# Patient Record
Sex: Male | Born: 1945 | Race: White | Hispanic: No | Marital: Married | Smoking: Former smoker
Health system: Southern US, Community
[De-identification: ages and names within clinical notes are randomized; demographics above are authoritative.]

## PROBLEM LIST (undated history)

## (undated) DIAGNOSIS — Z8669 Personal history of other diseases of the nervous system and sense organs: Secondary | ICD-10-CM

## (undated) DIAGNOSIS — S2239XA Fracture of one rib, unspecified side, initial encounter for closed fracture: Secondary | ICD-10-CM

## (undated) DIAGNOSIS — G43909 Migraine, unspecified, not intractable, without status migrainosus: Secondary | ICD-10-CM

## (undated) DIAGNOSIS — K219 Gastro-esophageal reflux disease without esophagitis: Secondary | ICD-10-CM

## (undated) DIAGNOSIS — M1990 Unspecified osteoarthritis, unspecified site: Secondary | ICD-10-CM

## (undated) DIAGNOSIS — D126 Benign neoplasm of colon, unspecified: Secondary | ICD-10-CM

## (undated) DIAGNOSIS — Z86010 Personal history of colonic polyps: Secondary | ICD-10-CM

## (undated) DIAGNOSIS — R519 Headache, unspecified: Secondary | ICD-10-CM

## (undated) DIAGNOSIS — M47812 Spondylosis without myelopathy or radiculopathy, cervical region: Secondary | ICD-10-CM

## (undated) DIAGNOSIS — M2550 Pain in unspecified joint: Secondary | ICD-10-CM

## (undated) DIAGNOSIS — J449 Chronic obstructive pulmonary disease, unspecified: Secondary | ICD-10-CM

## (undated) DIAGNOSIS — R51 Headache: Secondary | ICD-10-CM

## (undated) DIAGNOSIS — H269 Unspecified cataract: Secondary | ICD-10-CM

## (undated) DIAGNOSIS — J309 Allergic rhinitis, unspecified: Secondary | ICD-10-CM

## (undated) DIAGNOSIS — K620 Anal polyp: Secondary | ICD-10-CM

## (undated) DIAGNOSIS — I10 Essential (primary) hypertension: Secondary | ICD-10-CM

## (undated) DIAGNOSIS — N4889 Other specified disorders of penis: Secondary | ICD-10-CM

## (undated) DIAGNOSIS — D649 Anemia, unspecified: Secondary | ICD-10-CM

## (undated) DIAGNOSIS — Z98811 Dental restoration status: Secondary | ICD-10-CM

## (undated) DIAGNOSIS — Z973 Presence of spectacles and contact lenses: Secondary | ICD-10-CM

## (undated) DIAGNOSIS — G5622 Lesion of ulnar nerve, left upper limb: Secondary | ICD-10-CM

## (undated) DIAGNOSIS — E782 Mixed hyperlipidemia: Secondary | ICD-10-CM

## (undated) DIAGNOSIS — M1000 Idiopathic gout, unspecified site: Secondary | ICD-10-CM

## (undated) DIAGNOSIS — Z974 Presence of external hearing-aid: Secondary | ICD-10-CM

## (undated) DIAGNOSIS — M069 Rheumatoid arthritis, unspecified: Secondary | ICD-10-CM

## (undated) DIAGNOSIS — K922 Gastrointestinal hemorrhage, unspecified: Secondary | ICD-10-CM

## (undated) DIAGNOSIS — N400 Enlarged prostate without lower urinary tract symptoms: Secondary | ICD-10-CM

## (undated) DIAGNOSIS — E785 Hyperlipidemia, unspecified: Secondary | ICD-10-CM

## (undated) DIAGNOSIS — H43811 Vitreous degeneration, right eye: Secondary | ICD-10-CM

## (undated) DIAGNOSIS — K3580 Unspecified acute appendicitis: Secondary | ICD-10-CM

## (undated) DIAGNOSIS — Z860101 Personal history of adenomatous and serrated colon polyps: Secondary | ICD-10-CM

## (undated) DIAGNOSIS — M549 Dorsalgia, unspecified: Secondary | ICD-10-CM

## (undated) DIAGNOSIS — N401 Enlarged prostate with lower urinary tract symptoms: Secondary | ICD-10-CM

## (undated) DIAGNOSIS — F329 Major depressive disorder, single episode, unspecified: Secondary | ICD-10-CM

## (undated) DIAGNOSIS — N529 Male erectile dysfunction, unspecified: Secondary | ICD-10-CM

## (undated) DIAGNOSIS — H9190 Unspecified hearing loss, unspecified ear: Secondary | ICD-10-CM

## (undated) DIAGNOSIS — F32A Depression, unspecified: Secondary | ICD-10-CM

## (undated) HISTORY — DX: Unspecified osteoarthritis, unspecified site: M19.90

## (undated) HISTORY — DX: Vitreous degeneration, right eye: H43.811

## (undated) HISTORY — DX: Anal polyp: K62.0

## (undated) HISTORY — DX: Benign prostatic hyperplasia without lower urinary tract symptoms: N40.0

## (undated) HISTORY — DX: Chronic obstructive pulmonary disease, unspecified: J44.9

## (undated) HISTORY — DX: Unspecified hearing loss, unspecified ear: H91.90

## (undated) HISTORY — DX: Essential (primary) hypertension: I10

## (undated) HISTORY — DX: Male erectile dysfunction, unspecified: N52.9

## (undated) HISTORY — DX: Major depressive disorder, single episode, unspecified: F32.9

## (undated) HISTORY — DX: Allergic rhinitis, unspecified: J30.9

## (undated) HISTORY — DX: Benign neoplasm of colon, unspecified: D12.6

## (undated) HISTORY — DX: Pain in unspecified joint: M25.50

## (undated) HISTORY — PX: COLONOSCOPY: SHX174

## (undated) HISTORY — DX: Depression, unspecified: F32.A

## (undated) HISTORY — DX: Personal history of other diseases of the nervous system and sense organs: Z86.69

## (undated) HISTORY — DX: Gastro-esophageal reflux disease without esophagitis: K21.9

## (undated) HISTORY — DX: Lesion of ulnar nerve, left upper limb: G56.22

## (undated) HISTORY — PX: TONSILLECTOMY: SUR1361

## (undated) HISTORY — DX: Unspecified acute appendicitis: K35.80

## (undated) HISTORY — DX: Gastrointestinal hemorrhage, unspecified: K92.2

## (undated) HISTORY — PX: CATARACT EXTRACTION W/ INTRAOCULAR LENS  IMPLANT, BILATERAL: SHX1307

## (undated) HISTORY — DX: Fracture of one rib, unspecified side, initial encounter for closed fracture: S22.39XA

## (undated) HISTORY — DX: Hyperlipidemia, unspecified: E78.5

## (undated) HISTORY — DX: Rheumatoid arthritis, unspecified: M06.9

## (undated) HISTORY — DX: Dorsalgia, unspecified: M54.9

## (undated) HISTORY — DX: Other specified disorders of penis: N48.89

## (undated) HISTORY — DX: Unspecified cataract: H26.9

## (undated) HISTORY — DX: Spondylosis without myelopathy or radiculopathy, cervical region: M47.812

---

## 2002-03-24 ENCOUNTER — Ambulatory Visit (HOSPITAL_COMMUNITY): Admission: RE | Admit: 2002-03-24 | Discharge: 2002-03-24 | Payer: Self-pay | Admitting: Surgery

## 2002-03-24 ENCOUNTER — Encounter: Payer: Self-pay | Admitting: Surgery

## 2003-04-11 DIAGNOSIS — N486 Induration penis plastica: Secondary | ICD-10-CM

## 2003-04-11 DIAGNOSIS — N529 Male erectile dysfunction, unspecified: Secondary | ICD-10-CM

## 2003-04-11 HISTORY — DX: Male erectile dysfunction, unspecified: N52.9

## 2003-04-11 HISTORY — DX: Induration penis plastica: N48.6

## 2004-04-10 DIAGNOSIS — F32A Depression, unspecified: Secondary | ICD-10-CM

## 2004-04-10 HISTORY — DX: Depression, unspecified: F32.A

## 2007-04-11 DIAGNOSIS — Z8669 Personal history of other diseases of the nervous system and sense organs: Secondary | ICD-10-CM

## 2007-04-11 DIAGNOSIS — H43811 Vitreous degeneration, right eye: Secondary | ICD-10-CM

## 2007-04-11 HISTORY — DX: Vitreous degeneration, right eye: H43.811

## 2007-04-11 HISTORY — DX: Personal history of other diseases of the nervous system and sense organs: Z86.69

## 2007-05-10 ENCOUNTER — Ambulatory Visit: Payer: Self-pay | Admitting: Gastroenterology

## 2007-05-17 ENCOUNTER — Ambulatory Visit: Payer: Self-pay | Admitting: Gastroenterology

## 2007-05-17 ENCOUNTER — Encounter: Payer: Self-pay | Admitting: Gastroenterology

## 2011-04-11 DIAGNOSIS — K620 Anal polyp: Secondary | ICD-10-CM

## 2011-04-11 DIAGNOSIS — J449 Chronic obstructive pulmonary disease, unspecified: Secondary | ICD-10-CM

## 2011-04-11 HISTORY — DX: Anal polyp: K62.0

## 2011-04-11 HISTORY — DX: Chronic obstructive pulmonary disease, unspecified: J44.9

## 2011-07-06 ENCOUNTER — Ambulatory Visit: Payer: Self-pay | Admitting: Family Medicine

## 2011-07-06 ENCOUNTER — Other Ambulatory Visit: Payer: Self-pay | Admitting: Family Medicine

## 2011-07-06 ENCOUNTER — Ambulatory Visit
Admission: RE | Admit: 2011-07-06 | Discharge: 2011-07-06 | Disposition: A | Discharge status: Home / Self Care | Payer: Medicare Other | Source: Ambulatory Visit | Attending: Family Medicine | Admitting: Family Medicine

## 2011-07-06 DIAGNOSIS — R0989 Other specified symptoms and signs involving the circulatory and respiratory systems: Secondary | ICD-10-CM

## 2011-12-13 ENCOUNTER — Other Ambulatory Visit (HOSPITAL_COMMUNITY): Payer: Self-pay | Admitting: Family Medicine

## 2011-12-13 ENCOUNTER — Ambulatory Visit (HOSPITAL_COMMUNITY): Payer: Medicare Other | Attending: Cardiovascular Disease | Admitting: Radiology

## 2011-12-13 DIAGNOSIS — R9431 Abnormal electrocardiogram [ECG] [EKG]: Secondary | ICD-10-CM

## 2011-12-13 DIAGNOSIS — Z8249 Family history of ischemic heart disease and other diseases of the circulatory system: Secondary | ICD-10-CM | POA: Insufficient documentation

## 2011-12-13 DIAGNOSIS — Z87891 Personal history of nicotine dependence: Secondary | ICD-10-CM | POA: Insufficient documentation

## 2011-12-13 DIAGNOSIS — J449 Chronic obstructive pulmonary disease, unspecified: Secondary | ICD-10-CM | POA: Insufficient documentation

## 2011-12-13 DIAGNOSIS — J4489 Other specified chronic obstructive pulmonary disease: Secondary | ICD-10-CM | POA: Insufficient documentation

## 2011-12-13 DIAGNOSIS — I379 Nonrheumatic pulmonary valve disorder, unspecified: Secondary | ICD-10-CM | POA: Insufficient documentation

## 2011-12-13 DIAGNOSIS — I10 Essential (primary) hypertension: Secondary | ICD-10-CM | POA: Insufficient documentation

## 2011-12-13 NOTE — Progress Notes (Signed)
Echocardiogram performed.  

## 2011-12-21 ENCOUNTER — Other Ambulatory Visit: Payer: Self-pay | Admitting: Family Medicine

## 2011-12-21 DIAGNOSIS — F17200 Nicotine dependence, unspecified, uncomplicated: Secondary | ICD-10-CM

## 2012-01-02 ENCOUNTER — Ambulatory Visit (INDEPENDENT_AMBULATORY_CARE_PROVIDER_SITE_OTHER): Payer: Medicare Other | Admitting: Surgery

## 2012-01-02 ENCOUNTER — Encounter: Payer: Self-pay | Admitting: Gastroenterology

## 2012-01-02 ENCOUNTER — Encounter (INDEPENDENT_AMBULATORY_CARE_PROVIDER_SITE_OTHER): Payer: Self-pay | Admitting: Surgery

## 2012-01-02 VITALS — BP 128/86 | HR 70 | Temp 97.3°F | Resp 16 | Ht 73.0 in | Wt 271.4 lb

## 2012-01-02 DIAGNOSIS — K620 Anal polyp: Secondary | ICD-10-CM

## 2012-01-02 DIAGNOSIS — K621 Rectal polyp: Secondary | ICD-10-CM

## 2012-01-02 DIAGNOSIS — K648 Other hemorrhoids: Secondary | ICD-10-CM

## 2012-01-02 DIAGNOSIS — Z86010 Personal history of colonic polyps: Secondary | ICD-10-CM

## 2012-01-02 NOTE — Progress Notes (Signed)
Subjective:     Patient ID: Colton Cummings, male   DOB: March 20, 1946, 66 y.o.   MRN: [id number]  HPI  Colton Cummings  27-Sep-1945 [id number]  Patient Care Team: Carolyne Fiscal, MD as PCP - General (Family Medicine) Louis Meckel, MD as Consulting Physician (Gastroenterology)  This patient is a 66 y.o.male who presents today for surgical evaluation at the request of Dr. Duaine Dredge.   Reason for evaluation: Polyp and anal canal.  Pleasant obese male with a history of colon polyps.  Followed by colonoscopies with Dr. Arlyce Dice.  It lasted 2009.  Was found to have a lump in the anal canal by his primary care physician.  Sent to me for evaluation.  Patient has a bowel movement a day.  Some mild discomfort and bleeding at times but not every day.  No history of anal warts or anal receptive sex.  No history of sores or lesions.  No personal nor family history of GI/colon cancer, inflammatory bowel disease, irritable bowel syndrome, allergy such as Celiac Sprue, dietary/dairy problems, colitis, ulcers nor gastritis.  No recent sick contacts/gastroenteritis.  No travel outside the country.  No changes in diet.  Patient walks 30 minutes for about 1/2 mile without difficulty.  No exertional chest/neck/shoulder/arm pain.  The patient also notes a small skin mass on his right inner arm.  Occasionally itches.  He does not think it is gotten larger.  It is a concern to him however.   Patient Active Problem List  Diagnosis  . Personal history of colonic polyps  . Polyp of anal canal, anterior midline  . Hemorrhoids, internal    Past Medical History  Diagnosis Date  . Arthritis   . HTN (hypertension)   . GERD (gastroesophageal reflux disease)   . Anal polyp 2013    No past surgical history on file.  History   Social History  . Marital Status: Married    Spouse Name: N/A    Number of Children: N/A  . Years of Education: N/A   Occupational History  . Not on file.   Social History Main  Topics  . Smoking status: Former Smoker    Types: Cigarettes    Quit date: 01/02/2000  . Smokeless tobacco: Not on file  . Alcohol Use: 0.5 - 1.0 oz/week    1-2 drink(s) per week  . Drug Use: No  . Sexually Active: Not on file   Other Topics Concern  . Not on file   Social History Narrative  . No narrative on file    No family history on file.  Current Outpatient Prescriptions  Medication Sig Dispense Refill  . aspirin 81 MG tablet Take 81 mg by mouth daily.      . chlorthalidone (HYGROTON) 25 MG tablet       . finasteride (PROSCAR) 5 MG tablet       . IBUPROFEN PO Take by mouth.      Marland Kitchen lisinopril (PRINIVIL,ZESTRIL) 10 MG tablet       . omeprazole (PRILOSEC) 20 MG capsule       . simvastatin (ZOCOR) 10 MG tablet       . Tamsulosin HCl (FLOMAX) 0.4 MG CAPS       . tolterodine (DETROL) 2 MG tablet          Not on File  BP 128/86  Pulse 70  Temp 97.3 F (36.3 C) (Temporal)  Resp 16  Ht 6\' 1"  (1.854 m)  Wt 271 lb 6.4 oz (  123.106 kg)  BMI 35.81 kg/m2  No results found.   Review of Systems  Constitutional: Negative for fever, chills and diaphoresis.  HENT: Negative for nosebleeds, sore throat, facial swelling, mouth sores, trouble swallowing and ear discharge.   Eyes: Negative for photophobia, discharge and visual disturbance.  Respiratory: Negative for choking, chest tightness, shortness of breath and stridor.   Cardiovascular: Negative for chest pain and palpitations.  Gastrointestinal: Negative for nausea, vomiting, abdominal pain, diarrhea, constipation, abdominal distention and anal bleeding.  Genitourinary: Negative for dysuria, urgency, difficulty urinating and testicular pain.  Musculoskeletal: Negative for myalgias, back pain, arthralgias and gait problem.  Skin: Negative for color change, pallor, rash and wound.  Neurological: Negative for dizziness, speech difficulty, weakness, numbness and headaches.  Hematological: Negative for adenopathy. Does not  bruise/bleed easily.  Psychiatric/Behavioral: Negative for hallucinations, confusion and agitation.       Objective:   Physical Exam  Constitutional: He is oriented to person, place, and time. He appears well-developed and well-nourished. No distress.  HENT:  Head: Normocephalic.  Mouth/Throat: Oropharynx is clear and moist. No oropharyngeal exudate.  Eyes: Conjunctivae normal and EOM are normal. Pupils are equal, round, and reactive to light. No scleral icterus.  Neck: Normal range of motion. Neck supple. No tracheal deviation present.  Cardiovascular: Normal rate, regular rhythm and intact distal pulses.   Pulmonary/Chest: Effort normal and breath sounds normal. No respiratory distress.  Abdominal: Soft. He exhibits no distension. There is no tenderness. Hernia confirmed negative in the right inguinal area and confirmed negative in the left inguinal area.  Genitourinary: Penis normal. No penile tenderness.       Exam done with assistance of male Medical Assistant in the room. Perianal skin clean with good hygiene.  No pruritis.  No external skin tags / hemorrhoids of significance.  No pilonidal disease.  No fissure.  No abscess/fistula.  Tolerates digital and anoscopic rectal exam.  Normal sphincter tone.    Ant midline 10x38mm pedunculated polyp in anal canal.  Hard but mobile.  Hemorrhoidal piles mildly enlarged R post>L lat>R ant  Musculoskeletal: Normal range of motion. He exhibits no tenderness.  Lymphadenopathy:    He has no cervical adenopathy.       Right: No inguinal adenopathy present.       Left: No inguinal adenopathy present.  Neurological: He is alert and oriented to person, place, and time. No cranial nerve deficit. He exhibits normal muscle tone. Coordination normal.  Skin: Skin is warm and dry. No rash noted. He is not diaphoretic. No erythema. No pallor.     Psychiatric: He has a normal mood and affect. His behavior is normal. Judgment and thought content normal.        Assessment:     Anal canal polyp, probable benign but hard  Benign appearing skin lesion    Plan:     Myalgias this is a benign lesion.  However, the only way to be certain as to remove it.  One option is just to observe and if it does not change in size or character to just follow.  Other option is removal.  Given its location, I do not think I can do this office & would do in the operating room.  He is leaning more towards removal.  The anatomy & physiology of the anorectal region was discussed.  The pathophysiology of anorectal masses and differential diagnosis was discussed.  Natural history risks without surgery was discussed such as further growth and cancer.  The patient's symptoms are not adequately controlled by non-operative treatments.  I feel the risks & problems of no surgery outweigh the operative risks; therefore, I recommended surgery to treat the anal canal mass by removal .  Risks such as bleeding, infection, need for further treatment, heart attack, death, and other risks were discussed.   I noted a good likelihood this will help address the problem. Goals of post-operative recovery were discussed as well.  Possibility that this will not correct all symptoms was explained.  Post-operative pain, bleeding, constipation, and other problems after surgery were discussed.  We will work to minimize complications.   Educational handouts further explaining the pathology, treatment options, and bowel regimen were given as well.  Questions were answered.  The patient expresses understanding & wishes to proceed with surgery.  The skin lesion on his right arm for the most part appears benign but it is not completely normal.  May be reasonable remove that as well.  Follow at least.  We will see how insurance will cover that her

## 2012-01-02 NOTE — Patient Instructions (Signed)
See the Handout(s) we gave you.  Consider surgery to remove the polyp/mass in the anal canal.  Please call our office at [phone] if you wish to schedule surgery or if you have further questions / concerns.   HEMORRHOIDS   The rectum is the last few inches of your colon, and it naturally stretches to hold stool.  Hemorrhoidal piles are natural clusters of blood vessels that help the rectum stretch to hold stool and allow bowel movements to eliminate feces.  Hemorrhoids are abnormally swollen blood vessels in the rectum.  Too much pressure in the rectum causes hemorrhoids by forcing blood to stretch and bulge the walls of the veins, sometimes even rupturing them.  Hemorrhoids can become like varicose veins you might see on a person's legs. When bulging hemorrhoidal veins are irritated, they can swell, burn, itch, become very painful, and bleed. Once the rectal veins have been stretched out and hemorrhoids created, they are difficult to get rid of completely and tend to recur with less straining than it took to cause them in the first place. Fortunately, good habits and simple medical treatment usually control hemorrhoids well, and surgery is only recommended in unusually severe cases. Some of the most frequent causes of hemorrhoids:    Constant sitting    Straining with bowel movements (from constipation or hard stools)    Diarrhea    Sitting on the toilet for a long time    Severe coughing    Childbirth    Heavy Lifting  Types of Hemorrhoids:    Internal hemorrhoids usually don't hurt or itch; they are deep inside the rectum and usually have no sensation. However, internal hemorrhoids can bleed.  Such bleeding should not be ignored and mask blood from a dangerous source like colorectal cancer, so persistent rectal bleeding should be investigated with a colonoscopy.    External hemorrhoids cause most of the symptoms - pain, burning, and itching. Unirritated hemorrhoids can look like small skin  tags coming out of the anus.     Thrombosed hemorrhoids can form when a hemorrhoid blood vessel bursts and causes the hemorrhoid to swell.  A purple blood clot can form in it and become an excruciatingly painful lump at the anus. Because of these unpleasant symptoms, immediate incision and drainage by a surgeon at an office visit can provide much relief of the pain.    PREVENTION Avoiding the causes listed in above will prevent most cases of hemorrhoids, but this advice is sometimes hard to follow:  How can you avoid sitting all day if you have a seated job? Also, we try to avoid coughing and diarrhea, but sometimes it's beyond your control.  Still, there are some practical hints to help:    If your main job activity is seated, always stand or walk during your breaks. Make it a point to stand and walk at least 5 minutes every hour and try to shift frequently in your chair to avoid direct rectal pressure.    Always exhale as you strain or lift. Don't hold your breath.    Treat coughing, diarrhea and constipation early since irritated hemorrhoids may soon follow.    Do not delay or try to prevent a bowel movement when the urge is present.   Exercise regularly (walking or jogging 60 minutes a day) to stimulate the bowels to move.   Avoid dry toilet paper when cleaning after bowel movements.  Moistened tissues such as baby wipes are less irritating.  Lightly pat  the rectal area dry.  Using irrigating showers or bottle irrigation washing can more gently clean this sensitive area.   Keep the anal and genital area clean and  dry.  Talcum or baby powders can help   GET YOUR STOOLS SOFT.   This is the most important way to prevent irritated hemorrhoids.  Hard stools are like sandpaper to the anorectal canal and will cause more problems.   The goal: ONE SOFT BOWEL MOVEMENT A DAY!  To have soft, regular bowel movements:    Drink at least 8 tall glasses of water a day.     AVOID CONSTIPATION    Take plenty of  fiber.  Fiber is the undigested part of plant food that passes into the colon, acting s "natures broom" to encourage bowel motility and movement.  Fiber can absorb and hold large amounts of water. This results in a larger, bulkier stool, which is soft and easier to pass. Work gradually over several weeks up to 6 servings a day of fiber (25g a day even more if needed) in the form of: o Vegetables -- Root (potatoes, carrots, turnips), leafy green (lettuce, salad greens, celery, spinach), or cooked high residue (cabbage, broccoli, etc) o Fruit -- Fresh (unpeeled skin & pulp), Dried (prunes, apricots, cherries, etc ),  or stewed ( applesauce)  o Whole grain breads, pasta, etc (whole wheat)  o Bran cereals    Bulking Agents -- This type of water-retaining fiber generally is easily obtained each day by one of the following:  o Psyllium bran -- The psyllium plant is remarkable because its ground seeds can retain so much water. This product is available as Metamucil, Konsyl, Effersyllium, Per Diem Fiber, or the less expensive generic preparation in drug and health food stores. Although labeled a laxative, it really is not a laxative.  o Methylcellulose -- This is another fiber derived from wood which also retains water. It is available as Citrucel. o Polyethylene Glycol - and "artificial" fiber commonly called Miralax or Glycolax.  It is helpful for people with gassy or bloated feelings with regular fiber o Flax Seed - a less gassy fiber than psyllium   No reading or other relaxing activity while on the toilet. If bowel movements take longer than 5 minutes, you are too constipated   Laxatives can be useful for a short period if constipation is severe o Osmotics (Milk of Magnesia, Fleets phosphosoda, Magnesium citrate, MiraLax, GoLytely) are safer than  o Stimulants (Senokot, Castor Oil, Dulcolax, Ex Lax)    o Do not take laxatives for more than 7days in a row.   Laxatives are not a good long-term solution as  it can stress the intestine and colon and causes too much mineral and fluid losses.    If badly constipated, try a Bowel Retraining Program: o Do not use laxatives.  o Eat a diet high in roughage, such as bran cereals and leafy vegetables.  o Drink six (6) ounces of prune or apricot juice each morning.  o Eat two (2) large servings of stewed fruit each day.  o Take one (1) heaping dose of a bulking agent (ex. Metamucil, Citrucel, Miralax) twice a day.  o Use sugar-free sweetener when possible to avoid excessive calories.  o Eat a normal breakfast.  o Set aside 15 minutes after breakfast to sit on the toilet, but do not strain to have a bowel movement.  o If you do not have a bowel movement by the third day, use  an enema and repeat the above steps.    AVOID DIARRHEA o Switch to liquids and simpler foods for a few days to avoid stressing your intestines further. o Avoid dairy products (especially milk & ice cream) for a short time.  The intestines often can lose the ability to digest lactose when stressed. o Avoid foods that cause gassiness or bloating.  Typical foods include beans and other legumes, cabbage, broccoli, and dairy foods.  Every person has some sensitivity to other foods, so listen to our body and avoid those foods that trigger problems for you. o Adding fiber (Citrucel, Metamucil, psyllium, Miralax) gradually can help thicken stools by absorbing excess fluid and retrain the intestines to act more normally.  Slowly increase the dose over a few weeks.  Too much fiber too soon can backfire and cause cramping & bloating. o Probiotics (such as active yogurt, Align, etc) may help repopulate the intestines and colon with normal bacteria and calm down a sensitive digestive tract.  Most studies show it to be of mild help, though, and such products can be costly. o Medicines:   Bismuth subsalicylate (ex. Kayopectate, Pepto Bismol) every 30 minutes for up to 6 doses can help control diarrhea.   Avoid if pregnant.   Loperamide (Immodium) can slow down diarrhea.  Start with two tablets (4mg  total) first and then try one tablet every 6 hours.  Avoid if you are having fevers or severe pain.  If you are not better or start feeling worse, stop all medicines and call your doctor for advice o Call your doctor if you are getting worse or not better.  Sometimes further testing (cultures, endoscopy, X-ray studies, bloodwork, etc) may be needed to help diagnose and treat the cause of the diarrhea.   If these preventive measures fail, you must take action right away! Hemorrhoids are one condition that can be mild in the morning and become intolerable by nightfall.

## 2012-01-03 ENCOUNTER — Ambulatory Visit
Admission: RE | Admit: 2012-01-03 | Discharge: 2012-01-03 | Disposition: A | Discharge status: Home / Self Care | Payer: No Typology Code available for payment source | Source: Ambulatory Visit | Attending: Family Medicine | Admitting: Family Medicine

## 2012-01-03 DIAGNOSIS — F17200 Nicotine dependence, unspecified, uncomplicated: Secondary | ICD-10-CM

## 2012-01-11 ENCOUNTER — Other Ambulatory Visit (INDEPENDENT_AMBULATORY_CARE_PROVIDER_SITE_OTHER): Payer: Self-pay | Admitting: Surgery

## 2012-01-11 DIAGNOSIS — K620 Anal polyp: Secondary | ICD-10-CM

## 2012-01-11 DIAGNOSIS — K621 Rectal polyp: Secondary | ICD-10-CM

## 2012-01-11 DIAGNOSIS — K648 Other hemorrhoids: Secondary | ICD-10-CM

## 2012-01-11 HISTORY — PX: HEMORRHOID SURGERY: SHX153

## 2012-01-11 HISTORY — PX: RECTAL EXAM UNDER ANESTHESIA: SHX6399

## 2012-01-11 HISTORY — PX: RECTAL POLYPECTOMY: SHX2309

## 2012-01-16 ENCOUNTER — Telehealth (INDEPENDENT_AMBULATORY_CARE_PROVIDER_SITE_OTHER): Payer: Self-pay | Admitting: General Surgery

## 2012-01-16 NOTE — Telephone Encounter (Signed)
Message copied by Liliana Cline on Tue Jan 16, 2012  2:22 PM ------      Message from: Ardeth Sportsman      Created: Fri Jan 12, 2012  7:07 PM       Tell pt the good news! ANAL POLYP BENIGN

## 2012-01-16 NOTE — Telephone Encounter (Signed)
Left message on machine for patient to call back for path results.   

## 2012-01-29 ENCOUNTER — Ambulatory Visit (INDEPENDENT_AMBULATORY_CARE_PROVIDER_SITE_OTHER): Payer: Medicare Other | Admitting: Surgery

## 2012-01-29 ENCOUNTER — Encounter (INDEPENDENT_AMBULATORY_CARE_PROVIDER_SITE_OTHER): Payer: Self-pay | Admitting: Surgery

## 2012-01-29 VITALS — BP 148/84 | HR 84 | Resp 18 | Ht 73.0 in | Wt 257.0 lb

## 2012-01-29 DIAGNOSIS — K648 Other hemorrhoids: Secondary | ICD-10-CM

## 2012-01-29 DIAGNOSIS — K620 Anal polyp: Secondary | ICD-10-CM

## 2012-01-29 DIAGNOSIS — K621 Rectal polyp: Secondary | ICD-10-CM

## 2012-01-29 NOTE — Progress Notes (Signed)
Subjective:     Patient ID: Colton Cummings, male   DOB: 09-14-1945, 66 y.o.   MRN: [id number]  HPI  TARRENCE ENCK  [birth year] [id number]  Patient Care Team: Carolyne Fiscal, MD as PCP - General (Family Medicine) Louis Meckel, MD as Consulting Physician (Gastroenterology)  This patient is a 66 y.o.male who presents today for surgical evaluation s/p EUA & excision of anal canal polyp.   FINAL DIAGNOSIS Diagnosis Anus, biopsy, canal mass anterior midline - BENIGN FIBROEPITHELIAL POLYP (HYPERTROPHIED ANAL PAPILLAE). - NO DYSPLASIA OR MALIGNANCY.  The patient comes in today feeling well.  Mild soreness resolving.  No bleeding.  Feels something funny popping out with daily bowel movements.  No fevers or chills.  Energy level good.  Appetite good.  Patient Active Problem List  Diagnosis  . Personal history of colonic polyps  . Polyp of anal canal, anterior midline  . Hemorrhoids, internal    Past Medical History  Diagnosis Date  . Arthritis   . HTN (hypertension)   . GERD (gastroesophageal reflux disease)   . Anal polyp 2013    Past Surgical History  Procedure Date  . Rectal polypectomy 01/11/2012    History   Social History  . Marital Status: Married    Spouse Name: N/A    Number of Children: N/A  . Years of Education: N/A   Occupational History  . Not on file.   Social History Main Topics  . Smoking status: Former Smoker    Types: Cigarettes    Quit date: 01/02/2000  . Smokeless tobacco: Not on file  . Alcohol Use: 0.5 - 1.0 oz/week    1-2 drink(s) per week  . Drug Use: No  . Sexually Active: Not on file   Other Topics Concern  . Not on file   Social History Narrative  . No narrative on file    No family history on file.  Current Outpatient Prescriptions  Medication Sig Dispense Refill  . aspirin 81 MG tablet Take 81 mg by mouth daily.      . chlorthalidone (HYGROTON) 25 MG tablet       . finasteride (PROSCAR) 5 MG tablet       . IBUPROFEN  PO Take by mouth.      Marland Kitchen lisinopril (PRINIVIL,ZESTRIL) 10 MG tablet       . omeprazole (PRILOSEC) 20 MG capsule       . simvastatin (ZOCOR) 10 MG tablet       . Tamsulosin HCl (FLOMAX) 0.4 MG CAPS       . tolterodine (DETROL) 2 MG tablet          No Known Allergies  BP 148/84  Pulse 84  Resp 18  Ht 6\' 1"  (1.854 m)  Wt 257 lb (116.574 kg)  BMI 33.91 kg/m2  Ct Chest Wo/cm Screening  01/03/2012  *RADIOLOGY REPORT*  Clinical Data: Ex-smoker.  Lung cancer screening.  CT CHEST SCREENING WITHOUT CONTRAST  Technique:  Multidetector CT imaging of the chest was performed following the standard low-dose protocol without IV contrast.  Comparison: None.  Findings: No pathologically enlarged mediastinal or axillary lymph nodes.  Hilar regions are difficult to definitively evaluate without IV contrast.  Atherosclerotic calcification of the arterial vasculature, including coronary arteries.  Ascending aorta measures 4.0 cm.  Pulmonary arteries are enlarged.  There is thickening of the distal esophageal wall which can be seen with gastroesophageal reflux disease.  7 x 8 mm nodule, right middle lobe, image 203.  4 x 5 mm triangular-shaped nodule, along the right major fissure measures, image 188, likely a subpleural lymph node.  6 x 4 mm nodule versus vascular bifurcation, left lower lobe, image 183.  Additional scattered pulmonary nodules measure less than 4 mm in size.  No pleural fluid.  Airway is unremarkable.  Incidental imaging of the upper abdomen shows no acute findings. No worrisome lytic or sclerotic lesions. Degenerative Schmorl's nodes are seen in the mid and lower thoracic spine.  IMPRESSION:  1.  Scattered pulmonary nodules measure up to 7 x 8 mm in the right upper lobe.  Follow-up low-dose CT chest without contrast is recommended in 3 months. This recommendation follows the 2012 National [employer] lung cancer screening guidelines.  These results will be called to the ordering  clinician or representative by the Radiologist Assistant, and communication documented in the PACS Dashboard. 2.  Ascending aortic aneurysm, pulmonary arterial hypertension and coronary artery calcification.   Original Report Authenticated By: Reyes Ivan, M.D.      Review of Systems  Constitutional: Negative for fever, chills and diaphoresis.  HENT: Negative for sore throat, trouble swallowing and neck pain.   Eyes: Negative for photophobia and visual disturbance.  Respiratory: Negative for choking and shortness of breath.   Cardiovascular: Negative for chest pain and palpitations.  Gastrointestinal: Negative for nausea, vomiting, abdominal pain, diarrhea, constipation, blood in stool, abdominal distention and anal bleeding.       Mild rectal discomfort  Genitourinary: Negative for dysuria, urgency, difficulty urinating and testicular pain.  Musculoskeletal: Negative for myalgias, arthralgias and gait problem.  Skin: Negative for color change and rash.  Neurological: Negative for dizziness, speech difficulty, weakness and numbness.  Hematological: Negative for adenopathy.  Psychiatric/Behavioral: Negative for hallucinations, confusion and agitation.       Objective:   Physical Exam  Constitutional: He is oriented to person, place, and time. He appears well-developed and well-nourished. No distress.  HENT:  Head: Normocephalic.  Mouth/Throat: Oropharynx is clear and moist. No oropharyngeal exudate.  Eyes: Conjunctivae normal and EOM are normal. Pupils are equal, round, and reactive to light. No scleral icterus.  Neck: Normal range of motion. No tracheal deviation present.  Cardiovascular: Normal rate, normal heart sounds and intact distal pulses.   Pulmonary/Chest: Effort normal. No respiratory distress.  Abdominal: Soft. He exhibits no distension. There is no tenderness. Hernia confirmed negative in the right inguinal area and confirmed negative in the left inguinal area.        Incisions clean with normal healing ridges.  No hernias  Genitourinary: Penis normal.       Exam done with assistance of male Medical Assistant in the room.  Perianal skin clean with good hygiene.  No pruritis.  No external skin tags / hemorrhoids of significance.  No pilonidal disease.  No fissure.  No abscess/fistula.  Normal sphincter tone.    Anal canal narrow surgical wound with exposed tail of Vicryl stitch.  Trimmed off  Musculoskeletal: Normal range of motion. He exhibits no tenderness.  Neurological: He is alert and oriented to person, place, and time. No cranial nerve deficit. He exhibits normal muscle tone. Coordination normal.  Skin: Skin is warm and dry. No rash noted. He is not diaphoretic.  Psychiatric: He has a normal mood and affect. His behavior is normal.       Assessment:     Status post excision of prolapsing anal canal polyp.  Recovering well.    Plan:  Increase activity as tolerated.  Do not push through pain.  Diet as tolerated. Bowel regimen to avoid problems.  Return to clinic p.r.n. The patient expressed understanding and appreciation

## 2012-01-29 NOTE — Patient Instructions (Signed)
GETTING TO GOOD BOWEL HEALTH. Irregular bowel habits such as constipation and diarrhea can lead to many problems over time.  Having one soft bowel movement a day is the most important way to prevent further problems.  The anorectal canal is designed to handle stretching and feces to safely manage our ability to get rid of solid waste (feces, poop, stool) out of our body.  BUT, hard constipated stools can act like ripping concrete bricks and diarrhea can be a burning fire to this very sensitive area of our body, causing inflamed hemorrhoids, anal fissures, increasing risk is perirectal abscesses, abdominal pain/bloating, an making irritable bowel worse.     The goal: ONE SOFT BOWEL MOVEMENT A DAY!  To have soft, regular bowel movements:    Drink at least 8 tall glasses of water a day.     Take plenty of fiber.  Fiber is the undigested part of plant food that passes into the colon, acting s "natures broom" to encourage bowel motility and movement.  Fiber can absorb and hold large amounts of water. This results in a larger, bulkier stool, which is soft and easier to pass. Work gradually over several weeks up to 6 servings a day of fiber (25g a day even more if needed) in the form of: o Vegetables -- Root (potatoes, carrots, turnips), leafy green (lettuce, salad greens, celery, spinach), or cooked high residue (cabbage, broccoli, etc) o Fruit -- Fresh (unpeeled skin & pulp), Dried (prunes, apricots, cherries, etc ),  or stewed ( applesauce)  o Whole grain breads, pasta, etc (whole wheat)  o Bran cereals    Bulking Agents -- This type of water-retaining fiber generally is easily obtained each day by one of the following:  o Psyllium bran -- The psyllium plant is remarkable because its ground seeds can retain so much water. This product is available as Metamucil, Konsyl, Effersyllium, Per Diem Fiber, or the less expensive generic preparation in drug and health food stores. Although labeled a laxative, it really  is not a laxative.  o Methylcellulose -- This is another fiber derived from wood which also retains water. It is available as Citrucel. o Polyethylene Glycol - and "artificial" fiber commonly called Miralax or Glycolax.  It is helpful for people with gassy or bloated feelings with regular fiber o Flax Seed - a less gassy fiber than psyllium   No reading or other relaxing activity while on the toilet. If bowel movements take longer than 5 minutes, you are too constipated   AVOID CONSTIPATION.  High fiber and water intake usually takes care of this.  Sometimes a laxative is needed to stimulate more frequent bowel movements, but    Laxatives are not a good long-term solution as it can wear the colon out. o Osmotics (Milk of Magnesia, Fleets phosphosoda, Magnesium citrate, MiraLax, GoLytely) are safer than  o Stimulants (Senokot, Castor Oil, Dulcolax, Ex Lax)    o Do not take laxatives for more than 7days in a row.    IF SEVERELY CONSTIPATED, try a Bowel Retraining Program: o Do not use laxatives.  o Eat a diet high in roughage, such as bran cereals and leafy vegetables.  o Drink six (6) ounces of prune or apricot juice each morning.  o Eat two (2) large servings of stewed fruit each day.  o Take one (1) heaping tablespoon of a psyllium-based bulking agent twice a day. Use sugar-free sweetener when possible to avoid excessive calories.  o Eat a normal breakfast.  o   Set aside 15 minutes after breakfast to sit on the toilet, but do not strain to have a bowel movement.  o If you do not have a bowel movement by the third day, use an enema and repeat the above steps.    Controlling diarrhea o Switch to liquids and simpler foods for a few days to avoid stressing your intestines further. o Avoid dairy products (especially milk & ice cream) for a short time.  The intestines often can lose the ability to digest lactose when stressed. o Avoid foods that cause gassiness or bloating.  Typical foods include  beans and other legumes, cabbage, broccoli, and dairy foods.  Every person has some sensitivity to other foods, so listen to our body and avoid those foods that trigger problems for you. o Adding fiber (Citrucel, Metamucil, psyllium, Miralax) gradually can help thicken stools by absorbing excess fluid and retrain the intestines to act more normally.  Slowly increase the dose over a few weeks.  Too much fiber too soon can backfire and cause cramping & bloating. o Probiotics (such as active yogurt, Align, etc) may help repopulate the intestines and colon with normal bacteria and calm down a sensitive digestive tract.  Most studies show it to be of mild help, though, and such products can be costly. o Medicines:   Bismuth subsalicylate (ex. Kayopectate, Pepto Bismol) every 30 minutes for up to 6 doses can help control diarrhea.  Avoid if pregnant.   Loperamide (Immodium) can slow down diarrhea.  Start with two tablets (4mg  total) first and then try one tablet every 6 hours.  Avoid if you are having fevers or severe pain.  If you are not better or start feeling worse, stop all medicines and call your doctor for advice o Call your doctor if you are getting worse or not better.  Sometimes further testing (cultures, endoscopy, X-ray studies, bloodwork, etc) may be needed to help diagnose and treat the cause of the diarrhea.  HEMORRHOIDS   The rectum is the last few inches of your colon, and it naturally stretches to hold stool.  Hemorrhoidal piles are natural clusters of blood vessels that help the rectum stretch to hold stool and allow bowel movements to eliminate feces.  Hemorrhoids are abnormally swollen blood vessels in the rectum.  Too much pressure in the rectum causes hemorrhoids by forcing blood to stretch and bulge the walls of the veins, sometimes even rupturing them.  Hemorrhoids can become like varicose veins you might see on a person's legs. When bulging hemorrhoidal veins are irritated, they can  swell, burn, itch, become very painful, and bleed. Once the rectal veins have been stretched out and hemorrhoids created, they are difficult to get rid of completely and tend to recur with less straining than it took to cause them in the first place. Fortunately, good habits and simple medical treatment usually control hemorrhoids well, and surgery is only recommended in unusually severe cases. Some of the most frequent causes of hemorrhoids:    Constant sitting    Straining with bowel movements (from constipation or hard stools)    Diarrhea    Sitting on the toilet for a long time    Severe coughing    Childbirth    Heavy Lifting  Types of Hemorrhoids:    Internal hemorrhoids usually don't hurt or itch; they are deep inside the rectum and usually have no sensation. However, internal hemorrhoids can bleed.  Such bleeding should not be ignored and mask blood from a  dangerous source like colorectal cancer, so persistent rectal bleeding should be investigated with a colonoscopy.    External hemorrhoids cause most of the symptoms - pain, burning, and itching. Unirritated hemorrhoids can look like small skin tags coming out of the anus.     Thrombosed hemorrhoids can form when a hemorrhoid blood vessel bursts and causes the hemorrhoid to swell.  A purple blood clot can form in it and become an excruciatingly painful lump at the anus. Because of these unpleasant symptoms, immediate incision and drainage by a surgeon at an office visit can provide much relief of the pain.    PREVENTION Avoiding the causes listed in above will prevent most cases of hemorrhoids, but this advice is sometimes hard to follow:  How can you avoid sitting all day if you have a seated job? Also, we try to avoid coughing and diarrhea, but sometimes it's beyond your control.  Still, there are some practical hints to help:    If your main job activity is seated, always stand or walk during your breaks. Make it a point to stand and  walk at least 5 minutes every hour and try to shift frequently in your chair to avoid direct rectal pressure.    Always exhale as you strain or lift. Don't hold your breath.    Treat coughing, diarrhea and constipation early since irritated hemorrhoids may soon follow.    Do not delay or try to prevent a bowel movement when the urge is present.   Exercise regularly (walking or jogging 60 minutes a day) to stimulate the bowels to move.   Avoid dry toilet paper when cleaning after bowel movements.  Moistened tissues such as baby wipes are less irritating.  Lightly pat the rectal area dry.  Using irrigating showers or bottle irrigation washing can more gently clean this sensitive area.   Keep the anal and genital area clean and  dry.  Talcum or baby powders can help   GET YOUR STOOLS SOFT.   This is the most important way to prevent irritated hemorrhoids.  Hard stools are like sandpaper to the anorectal canal and will cause more problems.   The goal: ONE SOFT BOWEL MOVEMENT A DAY!  To have soft, regular bowel movements:    Drink at least 8 tall glasses of water a day.     AVOID CONSTIPATION    Take plenty of fiber.  Fiber is the undigested part of plant food that passes into the colon, acting s "natures broom" to encourage bowel motility and movement.  Fiber can absorb and hold large amounts of water. This results in a larger, bulkier stool, which is soft and easier to pass. Work gradually over several weeks up to 6 servings a day of fiber (25g a day even more if needed) in the form of: o Vegetables -- Root (potatoes, carrots, turnips), leafy green (lettuce, salad greens, celery, spinach), or cooked high residue (cabbage, broccoli, etc) o Fruit -- Fresh (unpeeled skin & pulp), Dried (prunes, apricots, cherries, etc ),  or stewed ( applesauce)  o Whole grain breads, pasta, etc (whole wheat)  o Bran cereals    Bulking Agents -- This type of water-retaining fiber generally is easily obtained each day by  one of the following:  o Psyllium bran -- The psyllium plant is remarkable because its ground seeds can retain so much water. This product is available as Metamucil, Konsyl, Effersyllium, Per Diem Fiber, or the less expensive generic preparation in drug and health  food stores. Although labeled a laxative, it really is not a laxative.  o Methylcellulose -- This is another fiber derived from wood which also retains water. It is available as Citrucel. o Polyethylene Glycol - and "artificial" fiber commonly called Miralax or Glycolax.  It is helpful for people with gassy or bloated feelings with regular fiber o Flax Seed - a less gassy fiber than psyllium   No reading or other relaxing activity while on the toilet. If bowel movements take longer than 5 minutes, you are too constipated   Laxatives can be useful for a short period if constipation is severe o Osmotics (Milk of Magnesia, Fleets phosphosoda, Magnesium citrate, MiraLax, GoLytely) are safer than  o Stimulants (Senokot, Castor Oil, Dulcolax, Ex Lax)    o Do not take laxatives for more than 7days in a row.   Laxatives are not a good long-term solution as it can stress the intestine and colon and causes too much mineral and fluid losses.    If badly constipated, try a Bowel Retraining Program: o Do not use laxatives.  o Eat a diet high in roughage, such as bran cereals and leafy vegetables.  o Drink six (6) ounces of prune or apricot juice each morning.  o Eat two (2) large servings of stewed fruit each day.  o Take one (1) heaping dose of a bulking agent (ex. Metamucil, Citrucel, Miralax) twice a day.  o Use sugar-free sweetener when possible to avoid excessive calories.  o Eat a normal breakfast.  o Set aside 15 minutes after breakfast to sit on the toilet, but do not strain to have a bowel movement.  o If you do not have a bowel movement by the third day, use an enema and repeat the above steps.    AVOID DIARRHEA o Switch to liquids and  simpler foods for a few days to avoid stressing your intestines further. o Avoid dairy products (especially milk & ice cream) for a short time.  The intestines often can lose the ability to digest lactose when stressed. o Avoid foods that cause gassiness or bloating.  Typical foods include beans and other legumes, cabbage, broccoli, and dairy foods.  Every person has some sensitivity to other foods, so listen to our body and avoid those foods that trigger problems for you. o Adding fiber (Citrucel, Metamucil, psyllium, Miralax) gradually can help thicken stools by absorbing excess fluid and retrain the intestines to act more normally.  Slowly increase the dose over a few weeks.  Too much fiber too soon can backfire and cause cramping & bloating. o Probiotics (such as active yogurt, Align, etc) may help repopulate the intestines and colon with normal bacteria and calm down a sensitive digestive tract.  Most studies show it to be of mild help, though, and such products can be costly. o Medicines:   Bismuth subsalicylate (ex. Kayopectate, Pepto Bismol) every 30 minutes for up to 6 doses can help control diarrhea.  Avoid if pregnant.   Loperamide (Immodium) can slow down diarrhea.  Start with two tablets (4mg  total) first and then try one tablet every 6 hours.  Avoid if you are having fevers or severe pain.  If you are not better or start feeling worse, stop all medicines and call your doctor for advice o Call your doctor if you are getting worse or not better.  Sometimes further testing (cultures, endoscopy, X-ray studies, bloodwork, etc) may be needed to help diagnose and treat the cause of the diarrhea.  If these preventive measures fail, you must take action right away! Hemorrhoids are one condition that can be mild in the morning and become intolerable by nightfall.

## 2012-02-27 ENCOUNTER — Other Ambulatory Visit: Payer: Self-pay | Admitting: Family Medicine

## 2012-02-27 DIAGNOSIS — R911 Solitary pulmonary nodule: Secondary | ICD-10-CM

## 2012-03-11 ENCOUNTER — Ambulatory Visit
Admission: RE | Admit: 2012-03-11 | Discharge: 2012-03-11 | Disposition: A | Discharge status: Home / Self Care | Payer: Medicare Other | Source: Ambulatory Visit | Attending: Family Medicine | Admitting: Family Medicine

## 2012-03-11 DIAGNOSIS — R911 Solitary pulmonary nodule: Secondary | ICD-10-CM

## 2012-04-29 ENCOUNTER — Encounter: Payer: Self-pay | Admitting: Gastroenterology

## 2012-05-09 ENCOUNTER — Encounter: Payer: Self-pay | Admitting: Gastroenterology

## 2012-05-27 ENCOUNTER — Encounter (INDEPENDENT_AMBULATORY_CARE_PROVIDER_SITE_OTHER): Payer: Self-pay

## 2012-06-18 ENCOUNTER — Ambulatory Visit (AMBULATORY_SURGERY_CENTER): Payer: Medicare Other | Admitting: *Deleted

## 2012-06-18 ENCOUNTER — Encounter: Payer: Self-pay | Admitting: Gastroenterology

## 2012-06-18 VITALS — Ht 73.0 in | Wt 265.4 lb

## 2012-06-18 DIAGNOSIS — Z1211 Encounter for screening for malignant neoplasm of colon: Secondary | ICD-10-CM

## 2012-06-18 DIAGNOSIS — Z86010 Personal history of colonic polyps: Secondary | ICD-10-CM

## 2012-06-18 MED ORDER — NA SULFATE-K SULFATE-MG SULF 17.5-3.13-1.6 GM/177ML PO SOLN: ORAL | Status: DC

## 2012-06-28 ENCOUNTER — Telehealth: Payer: Self-pay

## 2012-06-28 NOTE — Telephone Encounter (Signed)
Message copied by Chrystie Nose on Fri Jun 28, 2012  1:39 PM ------      Message from: Melvia Heaps D      Created: Fri Jun 28, 2012  1:28 PM       Needs OV for dysphagia. ------

## 2012-06-28 NOTE — Telephone Encounter (Signed)
Pt scheduled to see Dr. Arlyce Dice 07/10/12@9 :30am. Letter mailed to pt.

## 2012-07-01 ENCOUNTER — Ambulatory Visit (AMBULATORY_SURGERY_CENTER): Payer: Medicare Other | Admitting: Gastroenterology

## 2012-07-01 ENCOUNTER — Encounter: Payer: Self-pay | Admitting: Gastroenterology

## 2012-07-01 VITALS — BP 119/77 | HR 65 | Temp 97.6°F | Resp 18 | Ht 73.0 in | Wt 265.0 lb

## 2012-07-01 DIAGNOSIS — D126 Benign neoplasm of colon, unspecified: Secondary | ICD-10-CM

## 2012-07-01 DIAGNOSIS — K648 Other hemorrhoids: Secondary | ICD-10-CM

## 2012-07-01 DIAGNOSIS — Z86010 Personal history of colonic polyps: Secondary | ICD-10-CM

## 2012-07-01 DIAGNOSIS — Z1211 Encounter for screening for malignant neoplasm of colon: Secondary | ICD-10-CM

## 2012-07-01 MED ORDER — SODIUM CHLORIDE 0.9 % IV SOLN: 500.0000 mL | INTRAVENOUS | Status: DC

## 2012-07-01 NOTE — Op Note (Signed)
[facility] [address] [place], [address]   COLONOSCOPY PROCEDURE REPORT  PATIENT: Colton, Cummings  MR#: [id number] BIRTHDATE: [birth year] , 66  yrs. old GENDER: Male ENDOSCOPIST: Louis Meckel, MD REFERRED XB:JYNWG Duaine Dredge, M.D. PROCEDURE DATE:  07/01/2012 PROCEDURE:   Colonoscopy with snare polypectomy and Submucosal injection, any substance ASA CLASS:   Class II INDICATIONS:Patient's personal history of adenomatous colon polyps. colon polyps 2003, 2009 MEDICATIONS: MAC sedation, administered by CRNA and propofol (Diprivan) 300mg  IV  DESCRIPTION OF PROCEDURE:   After the risks benefits and alternatives of the procedure were thoroughly explained, informed consent was obtained.       The LB [id number]  endoscope was introduced through the anus and advanced to the cecum, which was identified by both the appendix and ileocecal valve. No adverse events experienced.   The quality of the prep was Suprep excellent The instrument was then slowly withdrawn as the colon was fully examined.      COLON FINDINGS: A sessile polyp was found at the cecum.  A polypectomy was performed with a cold snare.  The resection was complete and the polyp tissue was completely retrieved.   A sessile polyp measuring 15 mm in size was found in the proximal ascending colon.  A polypectomy was performed.  The polyp was removed piecemeal followingsubmucosal injection of 2 cc normal saline.  A single endoscopic clip was placed across the polypectomy site.  The resection was complete and the polyp tissue was completely retrieved.  A saline Injection was given to lift the mucosal wall. Internal hemorrhoids were found.   The colon mucosa was otherwise normal.  Retroflexed views revealed no abnormalities. The time to cecum=3 minutes 39 seconds.  Withdrawal time=18 minutes 04 seconds. The scope was withdrawn and the procedure completed. COMPLICATIONS: There were no  complications.  ENDOSCOPIC IMPRESSION: 1.   Sessile polyp was found at the cecum; polypectomy was performed with a cold snare 2.   Sessile polyp measuring 15 mm in size was found in the proximal ascending colon; polypectomy was performed; saline was given to lift the mucosal wall 3.   Internal hemorrhoids 4.   The colon mucosa was otherwise normal  RECOMMENDATIONS: If the polyp(s) removed today are proven to be adenomatous (pre-cancerous) polyps, you will need a colonoscopy in  year. Otherwise you should continue to follow colorectal cancer screening guidelines for "routine risk" patients with a colonoscopy in 10 years.  You will receive a letter within 1-2 weeks with the results of your biopsy as well as final recommendations.  Please call my office if you have not received a letter after 3 weeks.   eSigned:  Louis Meckel, MD 07/01/2012 9:44 AM   cc:   PATIENT NAME:  Colton Cummings, Colton Cummings MR#: [id number]

## 2012-07-01 NOTE — Progress Notes (Signed)
Called to room to assist during endoscopic procedure.  Patient ID and intended procedure confirmed with present staff. Received instructions for my participation in the procedure from the performing physician.  

## 2012-07-01 NOTE — Progress Notes (Signed)
Per Dr Arlyce Dice, pt doesn't need to stop his ASA

## 2012-07-01 NOTE — Progress Notes (Signed)
Patient did not experience any of the following events: a burn prior to discharge; a fall within the facility; wrong site/side/patient/procedure/implant event; or a hospital transfer or hospital admission upon discharge from the facility. (G8907) Patient did not have preoperative order for IV antibiotic SSI prophylaxis. (G8918)  

## 2012-07-01 NOTE — Patient Instructions (Addendum)

## 2012-07-02 ENCOUNTER — Telehealth: Payer: Self-pay

## 2012-07-02 NOTE — Telephone Encounter (Signed)
  Follow up Call-  Call back number 07/01/2012  Post procedure Call Back phone  # [phone]  Permission to leave phone message Yes     Patient questions:  Do you have a fever, pain , or abdominal swelling? no Pain Score  0 *  Have you tolerated food without any problems? yes  Have you been able to return to your normal activities? yes  Do you have any questions about your discharge instructions: Diet   no Medications  no Follow up visit  no  Do you have questions or concerns about your Care? no  Actions: * If pain score is 4 or above: No action needed, pain <4.  No problems per the pt. Maw

## 2012-07-05 ENCOUNTER — Encounter: Payer: Self-pay | Admitting: Gastroenterology

## 2012-07-08 ENCOUNTER — Telehealth: Payer: Self-pay | Admitting: *Deleted

## 2012-07-08 NOTE — Telephone Encounter (Signed)
Patient's wife called, and patient had a colonoscopy last Friday with Dr. Arlyce Dice.   Patient had had a little bleeding Friday, but nothing bad til they went to [place].  At that time the patient began to bleed lot of blood while taking a bathroom stop on their way home.  He passed out in the [facility]. The ambulance took him to the nearby hospital where they did admit him.  Today the doctor will be doing another colonoscopy to see what happened.   He does have an appointment with Dr. Arlyce Dice tomorrow.   I suggested that the Dr. In [place] call Dr. Arlyce Dice to speak with him with an update.  The patient's wife called either directly to the recovery room or was transferred from the 3rd floor.  There was no original phone note.

## 2012-07-08 NOTE — Telephone Encounter (Signed)
If you can obtain a number work to call the physician in [place] I would be happy to do so

## 2012-07-10 ENCOUNTER — Encounter: Payer: Self-pay | Admitting: Gastroenterology

## 2012-07-10 ENCOUNTER — Ambulatory Visit (INDEPENDENT_AMBULATORY_CARE_PROVIDER_SITE_OTHER): Payer: Medicare Other | Admitting: Gastroenterology

## 2012-07-10 VITALS — BP 128/72 | HR 66 | Ht 73.0 in | Wt 257.0 lb

## 2012-07-10 DIAGNOSIS — Z86010 Personal history of colonic polyps: Secondary | ICD-10-CM

## 2012-07-10 DIAGNOSIS — K921 Melena: Secondary | ICD-10-CM

## 2012-07-10 DIAGNOSIS — K922 Gastrointestinal hemorrhage, unspecified: Secondary | ICD-10-CM | POA: Insufficient documentation

## 2012-07-10 MED ORDER — FERROUS SULFATE DRIED 200 (65 FE) MG PO TABS: ORAL_TABLET | ORAL | Status: DC

## 2012-07-10 NOTE — Assessment & Plan Note (Signed)
Followup colonoscopy 2017 in view of size of the polyp

## 2012-07-10 NOTE — Assessment & Plan Note (Addendum)
The patient had a post polypectomy bleed despite placing a prophylactic endoscopic clip across the polypectomy site.  Bleeding has subsided spontaneously.  Recommendations #1 iron supplementation for a couple of weeks

## 2012-07-10 NOTE — Patient Instructions (Addendum)
We will send in your prescription to your pharmacy We will obtain your records from [facility]

## 2012-07-10 NOTE — Progress Notes (Signed)
History of Present Illness:  Mr. Wichman has returned following colonoscopy where a 15-20 mm serrated adenoma was removed from his right colon. Approximately 6 days post procedure, while traveling, he developed hematochezia and was hospitalized in [place]. Repeat colonoscopy, by report, demonstrated a few red spots in the area polypectomy but no active bleeding. He's been home for 2 days and has had no further bleeding. He has no other GI complaints.    Review of Systems: Pertinent positive and negative review of systems were noted in the above HPI section. All other review of systems were otherwise negative.    Current Medications, Allergies, Past Medical History, Past Surgical History, Family History and Social History were reviewed in [facility] electronic medical record  Vital signs were reviewed in today's medical record. Physical Exam: General: Well developed , well nourished, no acute distress

## 2012-07-19 ENCOUNTER — Telehealth: Payer: Self-pay | Admitting: Gastroenterology

## 2012-07-19 NOTE — Telephone Encounter (Signed)
Rec'd from [facility] forward 8 pages to Dr.Kaplan 07/19/12

## 2012-12-06 ENCOUNTER — Other Ambulatory Visit: Payer: Self-pay | Admitting: Family Medicine

## 2012-12-06 DIAGNOSIS — R911 Solitary pulmonary nodule: Secondary | ICD-10-CM

## 2012-12-11 ENCOUNTER — Ambulatory Visit
Admission: RE | Admit: 2012-12-11 | Discharge: 2012-12-11 | Disposition: A | Discharge status: Home / Self Care | Payer: Medicare Other | Source: Ambulatory Visit | Attending: Family Medicine | Admitting: Family Medicine

## 2012-12-11 DIAGNOSIS — R911 Solitary pulmonary nodule: Secondary | ICD-10-CM

## 2013-04-10 DIAGNOSIS — G5622 Lesion of ulnar nerve, left upper limb: Secondary | ICD-10-CM

## 2013-04-10 HISTORY — DX: Lesion of ulnar nerve, left upper limb: G56.22

## 2014-01-10 ENCOUNTER — Encounter: Payer: Self-pay | Admitting: Gastroenterology

## 2014-02-09 ENCOUNTER — Other Ambulatory Visit: Payer: Self-pay | Admitting: Family Medicine

## 2014-02-09 DIAGNOSIS — Z87898 Personal history of other specified conditions: Secondary | ICD-10-CM

## 2014-02-10 ENCOUNTER — Ambulatory Visit
Admission: RE | Admit: 2014-02-10 | Discharge: 2014-02-10 | Disposition: A | Discharge status: Home / Self Care | Payer: Medicare Other | Source: Ambulatory Visit | Attending: Family Medicine | Admitting: Family Medicine

## 2014-02-10 DIAGNOSIS — Z87898 Personal history of other specified conditions: Secondary | ICD-10-CM

## 2014-02-25 ENCOUNTER — Ambulatory Visit (INDEPENDENT_AMBULATORY_CARE_PROVIDER_SITE_OTHER): Payer: Self-pay

## 2014-02-25 ENCOUNTER — Ambulatory Visit (INDEPENDENT_AMBULATORY_CARE_PROVIDER_SITE_OTHER): Payer: Medicare Other | Admitting: Neurology

## 2014-02-25 DIAGNOSIS — M79609 Pain in unspecified limb: Secondary | ICD-10-CM

## 2014-02-25 DIAGNOSIS — R202 Paresthesia of skin: Principal | ICD-10-CM

## 2014-02-25 DIAGNOSIS — Z0289 Encounter for other administrative examinations: Secondary | ICD-10-CM

## 2014-02-25 NOTE — Progress Notes (Signed)
  [facility]    Provider:  Dr Jaynee Eagles Referring Provider: Marylene Land, MD Primary Care Physician:  Marylene Land, MD   HPI:  Colton Cummings is a 68 y.o. male here as a referral from Dr. Sandi Mariscal for left arm pain. Patient feels like his arm is going to sleep. There is numbness in digits 4-5 of the left hand that radiates up to the elbow. No neck pain. No radicular symptoms. Pain has been occurring near the elbow for years in the left arm but a few months ago noticed pain in the left hand fingers. +Tinel's Sign at the left elbow.  Summary: Nerve Conduction studies were performed on the bilateral upper extremities.   Bilateral Median APB motor conductions were within normal limits with normal F wave latencies. Right ADM motor conduction was within normal limits with normal F wave latency Left Ulnar ADM motor nerve showed conduction block when comparing wrist and across-elbow segment. The left Ulnar F wave latency was delayed (36.23ms, N<34) Bilateral Median sensory conductions were within normal limits.  The right Ulnar sensory conduction was within normal limits The left Ulnar sensory conduction showed decreased amplitude (7mV, N>10) Bilateral Dorsal Ulnar Cutaneous nerves were within normal limits.   Needle evaluation of the left First Dorsal Interosseous muscle showed moderately increased spontaneous activity. The following left muscles were within normal limits: Deltoid, Triceps. 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 No suggestion of cervical radiculopathy.    Sarina Ill, MD  [facility] [address] [place], [place] [address]  Phone [phone] Fax [phone]

## 2014-03-23 ENCOUNTER — Other Ambulatory Visit: Payer: Self-pay | Admitting: Orthopedic Surgery

## 2014-03-27 ENCOUNTER — Encounter (HOSPITAL_BASED_OUTPATIENT_CLINIC_OR_DEPARTMENT_OTHER): Payer: Self-pay | Admitting: *Deleted

## 2014-03-27 NOTE — Progress Notes (Signed)
Called for recent ekg-labs has a cold-taking meds

## 2014-03-27 NOTE — Progress Notes (Signed)
   03/27/14 1229  OBSTRUCTIVE SLEEP APNEA  Have you ever been diagnosed with sleep apnea through a sleep study? No  Do you snore loudly (loud enough to be heard through closed doors)?  0  Do you often feel tired, fatigued, or sleepy during the daytime? 0  Has anyone observed you stop breathing during your sleep? 0  Do you have, or are you being treated for high blood pressure? 1  BMI more than 35 kg/m2? 0  Age over 68 years old? 1  Neck circumference greater than 40 cm/16 inches? 1  Gender: 1  Obstructive Sleep Apnea Score 4  Score 4 or greater  Results sent to PCP

## 2014-03-31 ENCOUNTER — Encounter (HOSPITAL_BASED_OUTPATIENT_CLINIC_OR_DEPARTMENT_OTHER): Payer: Self-pay | Admitting: Orthopedic Surgery

## 2014-03-31 ENCOUNTER — Ambulatory Visit (HOSPITAL_BASED_OUTPATIENT_CLINIC_OR_DEPARTMENT_OTHER): Payer: Medicare Other | Admitting: Anesthesiology

## 2014-03-31 ENCOUNTER — Ambulatory Visit (HOSPITAL_BASED_OUTPATIENT_CLINIC_OR_DEPARTMENT_OTHER)
Admission: RE | Admit: 2014-03-31 | Discharge: 2014-03-31 | Disposition: A | Discharge status: Home / Self Care | Payer: Medicare Other | Source: Ambulatory Visit | Attending: Orthopedic Surgery | Admitting: Orthopedic Surgery

## 2014-03-31 ENCOUNTER — Encounter (HOSPITAL_BASED_OUTPATIENT_CLINIC_OR_DEPARTMENT_OTHER)
Admission: RE | Disposition: A | Discharge status: Home / Self Care | Payer: Self-pay | Source: Ambulatory Visit | Attending: Orthopedic Surgery

## 2014-03-31 DIAGNOSIS — M1990 Unspecified osteoarthritis, unspecified site: Secondary | ICD-10-CM | POA: Diagnosis not present

## 2014-03-31 DIAGNOSIS — K219 Gastro-esophageal reflux disease without esophagitis: Secondary | ICD-10-CM | POA: Insufficient documentation

## 2014-03-31 DIAGNOSIS — Z7982 Long term (current) use of aspirin: Secondary | ICD-10-CM | POA: Diagnosis not present

## 2014-03-31 DIAGNOSIS — G5622 Lesion of ulnar nerve, left upper limb: Secondary | ICD-10-CM | POA: Insufficient documentation

## 2014-03-31 DIAGNOSIS — I10 Essential (primary) hypertension: Secondary | ICD-10-CM | POA: Insufficient documentation

## 2014-03-31 DIAGNOSIS — Z87891 Personal history of nicotine dependence: Secondary | ICD-10-CM | POA: Insufficient documentation

## 2014-03-31 DIAGNOSIS — E785 Hyperlipidemia, unspecified: Secondary | ICD-10-CM | POA: Insufficient documentation

## 2014-03-31 HISTORY — DX: Dental restoration status: Z98.811

## 2014-03-31 HISTORY — DX: Presence of spectacles and contact lenses: Z97.3

## 2014-03-31 HISTORY — PX: ULNAR NERVE TRANSPOSITION: SHX2595

## 2014-03-31 LAB — POCT I-STAT, CHEM 8
BUN: 23 mg/dL (ref 6–23)
CREATININE: 0.8 mg/dL (ref 0.50–1.35)
Calcium, Ion: 1.18 mmol/L (ref 1.13–1.30)
Chloride: 103 mEq/L (ref 96–112)
Glucose, Bld: 107 mg/dL — ABNORMAL HIGH (ref 70–99)
HCT: 42 % (ref 39.0–52.0)
HEMOGLOBIN: 14.3 g/dL (ref 13.0–17.0)
Potassium: 3.9 mmol/L (ref 3.5–5.1)
SODIUM: 140 mmol/L (ref 135–145)
TCO2: 26 mmol/L (ref 0–100)

## 2014-03-31 SURGERY — ULNAR NERVE DECOMPRESSION/TRANSPOSITION
Anesthesia: General | Site: Elbow | Laterality: Left

## 2014-03-31 MED ORDER — ONDANSETRON HCL 4 MG/2ML IJ SOLN
INTRAMUSCULAR | Status: DC | PRN
  Administered 2014-03-31: 4 mg via INTRAVENOUS

## 2014-03-31 MED ORDER — CEFAZOLIN SODIUM-DEXTROSE 2-3 GM-% IV SOLR
INTRAVENOUS | Status: AC
  Filled 2014-03-31: qty 50

## 2014-03-31 MED ORDER — CHLORHEXIDINE GLUCONATE 4 % EX LIQD: 60.0000 mL | Freq: Once | CUTANEOUS | Status: DC

## 2014-03-31 MED ORDER — LACTATED RINGERS IV SOLN
INTRAVENOUS | Status: DC
  Administered 2014-03-31: 10:00:00 via INTRAVENOUS

## 2014-03-31 MED ORDER — FENTANYL CITRATE 0.05 MG/ML IJ SOLN
50.0000 ug | INTRAMUSCULAR | Status: DC | PRN
  Administered 2014-03-31: 100 ug via INTRAVENOUS

## 2014-03-31 MED ORDER — BUPIVACAINE-EPINEPHRINE (PF) 0.5% -1:200000 IJ SOLN
INTRAMUSCULAR | Status: DC | PRN
  Administered 2014-03-31: 25 mL via PERINEURAL

## 2014-03-31 MED ORDER — PROPOFOL 10 MG/ML IV EMUL
INTRAVENOUS | Status: AC
  Filled 2014-03-31: qty 50

## 2014-03-31 MED ORDER — FENTANYL CITRATE 0.05 MG/ML IJ SOLN
INTRAMUSCULAR | Status: AC
  Filled 2014-03-31: qty 2

## 2014-03-31 MED ORDER — HYDROCODONE-ACETAMINOPHEN 5-325 MG PO TABS: 1.0000 | ORAL_TABLET | Freq: Four times a day (QID) | ORAL | Status: DC | PRN

## 2014-03-31 MED ORDER — MIDAZOLAM HCL 2 MG/2ML IJ SOLN
1.0000 mg | INTRAMUSCULAR | Status: DC | PRN
  Administered 2014-03-31: 2 mg via INTRAVENOUS

## 2014-03-31 MED ORDER — LIDOCAINE HCL (CARDIAC) 20 MG/ML IV SOLN
INTRAVENOUS | Status: DC | PRN
  Administered 2014-03-31: 80 mg via INTRAVENOUS

## 2014-03-31 MED ORDER — MIDAZOLAM HCL 2 MG/2ML IJ SOLN
INTRAMUSCULAR | Status: AC
  Filled 2014-03-31: qty 2

## 2014-03-31 MED ORDER — DEXAMETHASONE SODIUM PHOSPHATE 4 MG/ML IJ SOLN
INTRAMUSCULAR | Status: DC | PRN
  Administered 2014-03-31: 10 mg via INTRAVENOUS

## 2014-03-31 MED ORDER — ONDANSETRON HCL 4 MG/2ML IJ SOLN: 4.0000 mg | Freq: Once | INTRAMUSCULAR | Status: DC | PRN

## 2014-03-31 MED ORDER — CEFAZOLIN SODIUM-DEXTROSE 2-3 GM-% IV SOLR: 2.0000 g | INTRAVENOUS | Status: DC

## 2014-03-31 MED ORDER — PROPOFOL 10 MG/ML IV BOLUS
INTRAVENOUS | Status: DC | PRN
  Administered 2014-03-31: 250 mg via INTRAVENOUS

## 2014-03-31 MED ORDER — FENTANYL CITRATE 0.05 MG/ML IJ SOLN
INTRAMUSCULAR | Status: AC
  Filled 2014-03-31: qty 4

## 2014-03-31 MED ORDER — HYDROMORPHONE HCL 1 MG/ML IJ SOLN: 0.2500 mg | INTRAMUSCULAR | Status: DC | PRN

## 2014-03-31 MED ORDER — CEFAZOLIN SODIUM-DEXTROSE 2-3 GM-% IV SOLR
2.0000 g | INTRAVENOUS | Status: AC
  Administered 2014-03-31: 2 g via INTRAVENOUS

## 2014-03-31 SURGICAL SUPPLY — 48 items
BLADE MINI RND TIP GREEN BEAV (BLADE) IMPLANT
BLADE SURG 15 STRL LF DISP TIS (BLADE) ×2 IMPLANT
BLADE SURG 15 STRL SS (BLADE) ×4
BNDG COHESIVE 3X5 TAN STRL LF (GAUZE/BANDAGES/DRESSINGS) ×6 IMPLANT
BNDG ESMARK 4X9 LF (GAUZE/BANDAGES/DRESSINGS) ×6 IMPLANT
BNDG GAUZE ELAST 4 BULKY (GAUZE/BANDAGES/DRESSINGS) ×6 IMPLANT
CHLORAPREP W/TINT 26ML (MISCELLANEOUS) ×6 IMPLANT
CLOSURE WOUND 1/2 X4 (GAUZE/BANDAGES/DRESSINGS) ×1
CORDS BIPOLAR (ELECTRODE) ×6 IMPLANT
COVER BACK TABLE 60X90IN (DRAPES) ×6 IMPLANT
COVER MAYO STAND STRL (DRAPES) ×6 IMPLANT
CUFF TOURNIQUET SINGLE 18IN (TOURNIQUET CUFF) ×6 IMPLANT
DECANTER SPIKE VIAL GLASS SM (MISCELLANEOUS) IMPLANT
DRAPE EXTREMITY T 121X128X90 (DRAPE) ×6 IMPLANT
DRAPE SURG 17X23 STRL (DRAPES) ×6 IMPLANT
DRSG PAD ABDOMINAL 8X10 ST (GAUZE/BANDAGES/DRESSINGS) ×6 IMPLANT
GAUZE SPONGE 4X4 12PLY STRL (GAUZE/BANDAGES/DRESSINGS) ×6 IMPLANT
GAUZE SPONGE 4X4 16PLY XRAY LF (GAUZE/BANDAGES/DRESSINGS) IMPLANT
GAUZE XEROFORM 1X8 LF (GAUZE/BANDAGES/DRESSINGS) ×6 IMPLANT
GLOVE BIOGEL PI IND STRL 8.5 (GLOVE) ×1 IMPLANT
GLOVE BIOGEL PI INDICATOR 8.5 (GLOVE) ×2
GLOVE SURG ORTHO 8.0 STRL STRW (GLOVE) ×3 IMPLANT
GOWN STRL REUS W/ TWL LRG LVL3 (GOWN DISPOSABLE) ×1 IMPLANT
GOWN STRL REUS W/TWL LRG LVL3 (GOWN DISPOSABLE) ×2
GOWN STRL REUS W/TWL XL LVL3 (GOWN DISPOSABLE) ×9 IMPLANT
LOOP VESSEL MAXI BLUE (MISCELLANEOUS) IMPLANT
NEEDLE PRECISIONGLIDE 27X1.5 (NEEDLE) IMPLANT
NS IRRIG 1000ML POUR BTL (IV SOLUTION) ×6 IMPLANT
PACK BASIN DAY SURGERY FS (CUSTOM PROCEDURE TRAY) ×6 IMPLANT
PAD CAST 3X4 CTTN HI CHSV (CAST SUPPLIES) IMPLANT
PAD CAST 4YDX4 CTTN HI CHSV (CAST SUPPLIES) ×1 IMPLANT
PADDING CAST ABS 4INX4YD NS (CAST SUPPLIES) ×2
PADDING CAST ABS COTTON 4X4 ST (CAST SUPPLIES) ×1 IMPLANT
PADDING CAST COTTON 3X4 STRL (CAST SUPPLIES)
PADDING CAST COTTON 4X4 STRL (CAST SUPPLIES) ×2
SLEEVE SCD COMPRESS KNEE MED (MISCELLANEOUS) ×3 IMPLANT
SLING ARM XL FOAM STRAP (SOFTGOODS) ×3 IMPLANT
SPLINT PLASTER CAST XFAST 3X15 (CAST SUPPLIES) IMPLANT
SPLINT PLASTER XTRA FASTSET 3X (CAST SUPPLIES)
STOCKINETTE 4X48 STRL (DRAPES) ×3 IMPLANT
STRIP CLOSURE SKIN 1/2X4 (GAUZE/BANDAGES/DRESSINGS) ×2 IMPLANT
SUT VIC AB 2-0 SH 27 (SUTURE) ×2
SUT VIC AB 2-0 SH 27XBRD (SUTURE) ×1 IMPLANT
SUT VICRYL 4-0 PS2 18IN ABS (SUTURE) ×3 IMPLANT
SYR BULB 3OZ (MISCELLANEOUS) ×6 IMPLANT
SYR CONTROL 10ML LL (SYRINGE) ×3 IMPLANT
TOWEL OR 17X24 6PK STRL BLUE (TOWEL DISPOSABLE) ×6 IMPLANT
UNDERPAD 30X30 INCONTINENT (UNDERPADS AND DIAPERS) IMPLANT

## 2014-03-31 NOTE — Discharge Instructions (Addendum)
[facility] Instructions °Hand Surgery ° °Wound Care: °Keep your hand elevated above the level of your heart.  Do not allow it to dangle by your side.  Keep the dressing dry and do not remove it unless your doctor advises you to do so.  He will usually change it at the time of your post-op visit.  Moving your fingers is advised to stimulate circulation but will depend on the site of your surgery.  If you have a splint applied, your doctor will advise you regarding movement. ° °Activity: °Do not drive or operate machinery today.  Rest today and then you may return to your normal activity and work as indicated by your physician. ° °Diet:  °Drink liquids today or eat a light diet.  You may resume a regular diet tomorrow.   ° °General expectations: °Pain for two to three days. °Fingers may become slightly swollen. ° °Call your doctor if any of the following occur: °Severe pain not relieved by pain medication. °Elevated temperature. °Dressing soaked with blood. °Inability to move fingers. °White or bluish color to fingers. ° ° °Regional Anesthesia Blocks ° °1. Numbness or the inability to move the "blocked" extremity may last from 3-48 hours after placement. The length of time depends on the medication injected and your individual response to the medication. If the numbness is not going away after 48 hours, call your surgeon. ° °2. The extremity that is blocked will need to be protected until the numbness is gone and the  Strength has returned. Because you cannot feel it, you will need to take extra care to avoid injury. Because it may be weak, you may have difficulty moving it or using it. You may not know what position it is in without looking at it while the block is in effect. ° °3. For blocks in the legs and feet, returning to weight bearing and walking needs to be done carefully. You will need to wait until the numbness is entirely gone and the strength has returned. You should be able to move your leg and foot  normally before you try and bear weight or walk. You will need someone to be with you when you first try to ensure you do not fall and possibly risk injury. ° °4. Bruising and tenderness at the needle site are common side effects and will resolve in a few days. ° °5. Persistent numbness or new problems with movement should be communicated to the surgeon or the [facility] ([phone])/ [facility] ([phone]). ° ° °Post Anesthesia Home Care Instructions ° °Activity: °Get plenty of rest for the remainder of the day. A responsible adult should stay with you for 24 hours following the procedure.  °For the next 24 hours, DO NOT: °-Drive a car °-Operate machinery °-Drink alcoholic beverages °-Take any medication unless instructed by your physician °-Make any legal decisions or sign important papers. ° °Meals: °Start with liquid foods such as gelatin or soup. Progress to regular foods as tolerated. Avoid greasy, spicy, heavy foods. If nausea and/or vomiting occur, drink only clear liquids until the nausea and/or vomiting subsides. Call your physician if vomiting continues. ° °Special Instructions/Symptoms: °Your throat may feel dry or sore from the anesthesia or the breathing tube placed in your throat during surgery. If this causes discomfort, gargle with warm salt water. The discomfort should disappear within 24 hours. ° °

## 2014-03-31 NOTE — Anesthesia Preprocedure Evaluation (Signed)
Anesthesia Evaluation  Patient identified by MRN, date of birth, ID band Patient awake    Reviewed: Allergy & Precautions, H&P , NPO status , Patient's Chart, lab work & pertinent test results  Airway Mallampati: I  TM Distance: >3 FB Neck ROM: Full    Dental  (+) Teeth Intact, Dental Advisory Given   Pulmonary former smoker,  breath sounds clear to auscultation        Cardiovascular hypertension, Pt. on medications Rhythm:Regular Rate:Normal     Neuro/Psych    GI/Hepatic GERD-  Medicated and Controlled,  Endo/Other  Morbid obesity  Renal/GU      Musculoskeletal   Abdominal   Peds  Hematology   Anesthesia Other Findings   Reproductive/Obstetrics                             Anesthesia Physical Anesthesia Plan  ASA: III  Anesthesia Plan: General   Post-op Pain Management:    Induction: Intravenous  Airway Management Planned: LMA  Additional Equipment:   Intra-op Plan:   Post-operative Plan: Extubation in OR  Informed Consent: I have reviewed the patients History and Physical, chart, labs and discussed the procedure including the risks, benefits and alternatives for the proposed anesthesia with the patient or authorized representative who has indicated his/her understanding and acceptance.   Dental advisory given  Plan Discussed with: CRNA, Anesthesiologist and Surgeon  Anesthesia Plan Comments:         Anesthesia Quick Evaluation

## 2014-03-31 NOTE — H&P (Signed)
Colton Cummings is a 68 year old right hand dominant male referred with numbness and tingling in his left hand primarily ring and small fingers on a relatively constant basis. He does have occasional pains in his thumb with numbness. This has been going on for at least 5 years. He had a shot by Dr. Mayer Camel 5 years ago to the ulnar nerve which gave him relief. He recalls no history of injury to the elbow, hand or neck. He is not awakened at night. He complains of intermittent severe to extremely severe dull aching numbness. He has been taking ibuprofen. There is no history of diabetes, thyroid problems, arthritis or gout. He has had nerve conductions by Dr. Jaynee Eagles. This reveals a cubital tunnel syndrome on his left side with needle evaluation first dorsal interosseous showing moderate increase spontaneous activity. The muscles were normal. This was done on 02-25-14.   PAST MEDICAL HISTORY:  He has no known drug allergies. He is on the following medications: Tamsulosin, finasteride, simvastatin, Chlorthalidone, Lisinopril, loratadine, aspirin, ibuprofen, Tolterodine and omeprazole. He has had no surgery.  FAMILY MEDICAL HISTORY: Positive for diabetes, high BP and arthritis.  SOCIAL HISTORY:  He does not smoke. He quit in 2001. He drinks socially.  REVIEW OF SYSTEMS: Positive for glasses, hearing loss, ringing in his ears, migraines and high BP otherwise negative 14 points.  Colton Cummings is an 68 y.o. male.   Chief Complaint: cubital tunnel left arm HPI: see above  Past Medical History  Diagnosis Date  . Arthritis   . HTN (hypertension)   . GERD (gastroesophageal reflux disease)   . Anal polyp 2013  . Hyperlipidemia   . Wears glasses   . Dental crowns present     Past Surgical History  Procedure Laterality Date  . Rectal polypectomy  01/11/2012    Excision of anal canal mass.  . Hemorrhoid surgery  01/11/2012    Internal hemorrhoid ligation  . Tonsillectomy    . Colonoscopy      Family  History  Problem Relation Age of Onset  . Colon cancer Neg Hx   . Esophageal cancer Neg Hx   . Rectal cancer Neg Hx   . Stomach cancer Neg Hx    Social History:  reports that he quit smoking about 14 years ago. His smoking use included Cigarettes. He smoked 0.00 packs per day. He has never used smokeless tobacco. He reports that he drinks about 0.5 - 1.0 oz of alcohol per week. He reports that he does not use illicit drugs.  Allergies: No Known Allergies  Medications Prior to Admission  Medication Sig Dispense Refill  . chlorpheniramine-HYDROcodone (TUSSIONEX PENNKINETIC ER) 10-8 MG/5ML LQCR Take 5 mLs by mouth every 12 (twelve) hours as needed for cough.    Marland Kitchen guaiFENesin (MUCINEX) 600 MG 12 hr tablet Take by mouth 2 (two) times daily.    Marland Kitchen loratadine (CLARITIN) 10 MG tablet Take 10 mg by mouth daily.    Marland Kitchen aspirin 81 MG tablet Take 81 mg by mouth daily.    . chlorthalidone (HYGROTON) 25 MG tablet Take 25 mg by mouth daily.     . Ferrous Sulfate Dried (FEOSOL) 200 (65 FE) MG TABS Take one tab twice a day 30 tablet 1  . finasteride (PROSCAR) 5 MG tablet Take 5 mg by mouth daily.     . IBUPROFEN PO Take by mouth as needed.     Marland Kitchen lisinopril (PRINIVIL,ZESTRIL) 10 MG tablet Take 10 mg by mouth daily.     Marland Kitchen  omeprazole (PRILOSEC) 20 MG capsule Take 20 mg by mouth daily.     . simvastatin (ZOCOR) 10 MG tablet Take 10 mg by mouth at bedtime.     . Tamsulosin HCl (FLOMAX) 0.4 MG CAPS 0.4 mg daily after supper.     . tolterodine (DETROL) 2 MG tablet Take 2 mg by mouth.       No results found for this or any previous visit (from the past 48 hour(s)).  No results found.   Pertinent items are noted in HPI.  Height 6\' 1"  (1.854 m), weight 116.574 kg (257 lb).  General appearance: alert, cooperative and appears stated age Head: Normocephalic, without obvious abnormality Neck: no JVD Resp: clear to auscultation bilaterally Cardio: regular rate and rhythm, S1, S2 normal, no murmur, click, rub  or gallop GI: soft, non-tender; bowel sounds normal; no masses,  no organomegaly Extremities: numbness left ring and small fingers Pulses: 2+ and symmetric Skin: Skin color, texture, turgor normal. No rashes or lesions Neurologic: Grossly normal Incision/Wound: na  Assessment/Plan X-rays of his neck reveals degenerative changes at multiple levels. There is some foraminal encroach at 5/6.  X-rays of his elbow reveal degenerative changes with a posterior spur in the olecranon, a spur for his lateral epicondyle.  Diagnosis: (1) Cervical spondylosis. (2) Degenerative arthritis elbow asymptomatic. (3) Cubital tunnel syndrome left arm.   We would recommend in that he shows no atrophy to the intrinsics at the present time he consider having this surgically released with the possibility of decompression or transposition dictated by findings at surgery. Pre, peri and post op care are discussed along with risks and complications. Patient is aware there is no guarantee with surgery, possibility of infection, injury to arteries, nerves, and tendons, incomplete relief and dystrophy. We have discussed with him we are attempting to halt the process and it may not show improvement if he shows muscle atrophy this generally will not return. Hopefully he can get some return but there is no guarantee he will have any significant improvement in his symptoms. He would like to proceed to have this done. This will be scheduled as an outpatient under regional anesthesia for decompression, possible transposition left ulnar nerve. Colton Cummings R 03/31/2014, 9:18 AM

## 2014-03-31 NOTE — Transfer of Care (Signed)
Immediate Anesthesia Transfer of Care Note  Patient: Colton Cummings  Procedure(s) Performed: Procedure(s): DECOMPRESSION  LEFT ULNAR NERVE (Left)  Patient Location: PACU  Anesthesia Type:GA combined with regional for post-op pain  Level of Consciousness: awake and patient cooperative  Airway & Oxygen Therapy: Patient Spontanous Breathing and Patient connected to face mask oxygen  Post-op Assessment: Report given to PACU RN and Post -op Vital signs reviewed and stable  Post vital signs: Reviewed and stable  Complications: No apparent anesthesia complications

## 2014-03-31 NOTE — Brief Op Note (Signed)
03/31/2014  11:06 AM  PATIENT:  Colton Cummings  68 y.o. male  PRE-OPERATIVE DIAGNOSIS:  cubital tunnel left  POST-OPERATIVE DIAGNOSIS:  cubital tunnel left  PROCEDURE:  Procedure(s): DECOMPRESSION  LEFT ULNAR NERVE (Left)  SURGEON:  Surgeon(s) and Role:    * Daryll Brod, MD - Primary    * Leanora Cover, MD - Assisting  PHYSICIAN ASSISTANT:   ASSISTANTS: K Merrill Deanda,MD   ANESTHESIA:   regional and general  EBL:     BLOOD ADMINISTERED:none  DRAINS: none   LOCAL MEDICATIONS USED:  NONE  SPECIMEN:  No Specimen  DISPOSITION OF SPECIMEN:  N/A  COUNTS:  YES  TOURNIQUET:   Total Tourniquet Time Documented: Upper Arm (Left) - 16 minutes Total: Upper Arm (Left) - 16 minutes   DICTATION: .Other Dictation: Dictation Number [id number]  PLAN OF CARE: Discharge to home after PACU  PATIENT DISPOSITION:  PACU - hemodynamically stable.

## 2014-03-31 NOTE — Op Note (Signed)
Dictation Number [id number]

## 2014-03-31 NOTE — Anesthesia Postprocedure Evaluation (Signed)
  Anesthesia Post-op Note  Patient: Colton Cummings  Procedure(s) Performed: Procedure(s): DECOMPRESSION  LEFT ULNAR NERVE (Left)  Patient Location: PACU  Anesthesia Type: General with regional for post op pain   Level of Consciousness: awake, alert  and oriented  Airway and Oxygen Therapy: Patient Spontanous Breathing  Post-op Pain: none  Post-op Assessment: Post-op Vital signs reviewed  Post-op Vital Signs: Reviewed  Last Vitals:  Filed Vitals:   03/31/14 1220  BP: 133/70  Pulse: 68  Temp: 36.5 C  Resp: 16    Complications: No apparent anesthesia complications

## 2014-03-31 NOTE — Op Note (Signed)
Colton Cummings, Colton Cummings                ACCOUNT NO.:  [id number]  MEDICAL RECORD NO.:  [id number]  LOCATION:                                 FACILITY:  PHYSICIAN:  Daryll Brod, M.D.            DATE OF BIRTH:  DATE OF PROCEDURE:  03/31/2014 DATE OF DISCHARGE:                              OPERATIVE REPORT   PREOPERATIVE DIAGNOSIS:  Cubital tunnel syndrome of the left thumb.  POSTOPERATIVE DIAGNOSIS:  Cubital tunnel syndrome of the left thumb.  OPERATION:  Decompression ulnar nerve, left elbow.  SURGEON:  Daryll Brod, M.D.  ASSISTANT:  Leanora Cover, MD  ANESTHESIA:  Supraclavicular block general.  ANESTHESIOLOGIST:  Lorrene Reid, M.D.  HISTORY:  The patient is a 68 year old male with a history of numbness and tingling to the ulnar nerve distribution. Nerve conduction is positive for cubital tunnel.  He has elected to undergo surgical decompression to the nerve.  He is aware that there is no guarantee with the surgery; possibility of infection; recurrence of injury to arteries, nerves, tendons, incomplete relief of symptoms, dystrophy that we are attempting to hold the process rather than guarantee, improvement, hopefully this will improve, but he is well aware that this may not show any significant change.  In the preoperative area, the patient is seen, the extremity marked by both the patient and surgeon.  Antibiotic given.  PROCEDURE IN DETAIL:  The patient was brought to the operating room after a supraclavicular block was carried out in the preoperative area by Dr. Al Corpus.  He was prepped and draped using ChloraPrep in supine position with the left arm free.  A general anesthetic had been administered under the direction of Dr. Al Corpus.  A 3-minute dry time was taken.  Time-out taken confirming the patient and procedure.  The limb was exsanguinated with an Esmarch bandage.  Tourniquet placed high and the upper arm was inflated to 250 mmHg.  A longitudinal incision was made over the  posterior aspect of the medial epicondyle, left elbow, carried down through subcutaneous tissue.  Bleeders were electrocauterized with bipolar.  The dissection carried down to the ulnar nerve.  Osborne fascia were incised on its most posterior aspect. The subcutaneous tissue was then separated from the flexor carpi ulnaris fascia distally.  Two knee retractors were placed.  The fascia was then split along with the muscle belly.  The deep fascia was then split using a KMI carpal tunnel guide and had an angled ENT straight scissors.  This was done for approximately 6-8 cm distally.  Attention was then turned proximally.  The fascia was separated from the overlying subcutaneous tissue and skin.  The ulnar nerve course was followed proximally.  The 2 knee retractors placed proximally.  The KMI skid placed between the brachial fascia and the ulnar nerve proximally over the [facility] guide protecting the ulnar nerve.  The angled ENT scissors were then used to transect the brachial fascia proximally again for 6-8 cm proximally. The nerve was entirely released with flexion of the elbow.  No subluxation was noted.  The wound was copiously irrigated with saline. Osborne's fascia was then sutured to the posterior skin  subcutaneous tissue with figure-of-eight 2-0 Vicryl sutures.  The subcutaneous tissue was closed with interrupted 2-0 Vicryl and the skin with a subcuticular 4-0 Monocryl suture.  Steri-Strips were applied.  A sterile compressive dressing was applied.  On deflation of the tourniquet, all fingers immediately pinked.  He was taken to the recovery room for observation in satisfactory condition.  He will be discharged home to return to the [place] in 1 week on Norco.          ______________________________ Daryll Brod, M.D.     GK/MEDQ  D:  03/31/2014  T:  03/31/2014  Job:  [id number]

## 2014-03-31 NOTE — Anesthesia Procedure Notes (Signed)
Anesthesia Regional Block:  Supraclavicular block  Pre-Anesthetic Checklist: ,, timeout performed, Correct Patient, Correct Site, Correct Laterality, Correct Procedure, Correct Position, site marked, Risks and benefits discussed,  Surgical consent,  Pre-op evaluation,  At surgeon's request and post-op pain management  Laterality: Left and Upper  Prep: chloraprep       Needles:  Injection technique: Single-shot  Needle Type: Echogenic Stimulator Needle     Needle Length: 5cm 5 cm Needle Gauge: 21 and 21 G    Additional Needles:  Procedures: ultrasound guided (picture in chart) Supraclavicular block Narrative:  Start time: 03/31/2014 10:20 AM End time: 03/31/2014 10:26 AM Injection made incrementally with aspirations every 5 mL.  Performed by: Personally  Anesthesiologist: Derma, [place]

## 2014-03-31 NOTE — Progress Notes (Signed)
  Assisted Dr. Crews with left, ultrasound guided, supraclavicular block. Side rails up, monitors on throughout procedure. See vital signs in flow sheet. Tolerated Procedure well. 

## 2014-04-01 ENCOUNTER — Encounter (HOSPITAL_BASED_OUTPATIENT_CLINIC_OR_DEPARTMENT_OTHER): Payer: Self-pay | Admitting: Orthopedic Surgery

## 2015-03-26 ENCOUNTER — Other Ambulatory Visit: Payer: Self-pay | Admitting: Family Medicine

## 2015-03-26 DIAGNOSIS — R911 Solitary pulmonary nodule: Secondary | ICD-10-CM

## 2015-03-26 DIAGNOSIS — I77810 Thoracic aortic ectasia: Secondary | ICD-10-CM

## 2015-04-02 ENCOUNTER — Ambulatory Visit
Admission: RE | Admit: 2015-04-02 | Discharge: 2015-04-02 | Disposition: A | Discharge status: Home / Self Care | Payer: Medicare Other | Source: Ambulatory Visit | Attending: Family Medicine | Admitting: Family Medicine

## 2015-04-02 DIAGNOSIS — I77810 Thoracic aortic ectasia: Secondary | ICD-10-CM

## 2015-04-02 DIAGNOSIS — R911 Solitary pulmonary nodule: Secondary | ICD-10-CM

## 2015-04-08 ENCOUNTER — Other Ambulatory Visit: Payer: Self-pay | Admitting: Family Medicine

## 2015-04-08 DIAGNOSIS — R918 Other nonspecific abnormal finding of lung field: Secondary | ICD-10-CM

## 2015-04-09 ENCOUNTER — Inpatient Hospital Stay: Admission: RE | Admit: 2015-04-09 | Payer: Medicare Other | Source: Ambulatory Visit

## 2015-04-16 ENCOUNTER — Ambulatory Visit
Admission: RE | Admit: 2015-04-16 | Discharge: 2015-04-16 | Disposition: A | Discharge status: Home / Self Care | Payer: Medicare Other | Source: Ambulatory Visit | Attending: Family Medicine | Admitting: Family Medicine

## 2015-04-16 ENCOUNTER — Other Ambulatory Visit: Payer: Self-pay | Admitting: Family Medicine

## 2015-04-16 DIAGNOSIS — R9389 Abnormal findings on diagnostic imaging of other specified body structures: Secondary | ICD-10-CM

## 2015-04-16 DIAGNOSIS — R918 Other nonspecific abnormal finding of lung field: Secondary | ICD-10-CM

## 2015-04-16 MED ORDER — IOPAMIDOL (ISOVUE-300) INJECTION 61%
75.0000 mL | Freq: Once | INTRAVENOUS | Status: AC | PRN
  Administered 2015-04-16: 75 mL via INTRAVENOUS

## 2015-06-02 ENCOUNTER — Encounter: Payer: Self-pay | Admitting: Gastroenterology

## 2015-06-16 ENCOUNTER — Observation Stay (HOSPITAL_COMMUNITY): Payer: Medicare Other

## 2015-06-16 ENCOUNTER — Emergency Department (HOSPITAL_COMMUNITY): Payer: Medicare Other

## 2015-06-16 ENCOUNTER — Encounter (HOSPITAL_COMMUNITY): Payer: Self-pay

## 2015-06-16 ENCOUNTER — Encounter (HOSPITAL_COMMUNITY)
Admission: EM | Disposition: A | Discharge status: Home / Self Care | Payer: Self-pay | Source: Home / Self Care | Attending: Emergency Medicine

## 2015-06-16 ENCOUNTER — Observation Stay (HOSPITAL_COMMUNITY)
Admission: EM | Admit: 2015-06-16 | Discharge: 2015-06-17 | Disposition: A | Discharge status: Home / Self Care | Payer: Medicare Other | Attending: General Surgery | Admitting: General Surgery

## 2015-06-16 ENCOUNTER — Other Ambulatory Visit: Payer: Self-pay | Admitting: Family Medicine

## 2015-06-16 ENCOUNTER — Ambulatory Visit
Admission: RE | Admit: 2015-06-16 | Discharge: 2015-06-16 | Disposition: A | Discharge status: Home / Self Care | Payer: Medicare Other | Source: Ambulatory Visit | Attending: Family Medicine | Admitting: Family Medicine

## 2015-06-16 ENCOUNTER — Observation Stay (HOSPITAL_COMMUNITY): Payer: Medicare Other | Admitting: Anesthesiology

## 2015-06-16 DIAGNOSIS — R109 Unspecified abdominal pain: Secondary | ICD-10-CM

## 2015-06-16 DIAGNOSIS — K219 Gastro-esophageal reflux disease without esophagitis: Secondary | ICD-10-CM | POA: Insufficient documentation

## 2015-06-16 DIAGNOSIS — K353 Acute appendicitis with localized peritonitis: Principal | ICD-10-CM | POA: Insufficient documentation

## 2015-06-16 DIAGNOSIS — E785 Hyperlipidemia, unspecified: Secondary | ICD-10-CM | POA: Diagnosis not present

## 2015-06-16 DIAGNOSIS — Z01818 Encounter for other preprocedural examination: Secondary | ICD-10-CM

## 2015-06-16 DIAGNOSIS — Z79899 Other long term (current) drug therapy: Secondary | ICD-10-CM | POA: Diagnosis not present

## 2015-06-16 DIAGNOSIS — I10 Essential (primary) hypertension: Secondary | ICD-10-CM | POA: Insufficient documentation

## 2015-06-16 DIAGNOSIS — K3580 Unspecified acute appendicitis: Secondary | ICD-10-CM | POA: Diagnosis present

## 2015-06-16 DIAGNOSIS — Z7982 Long term (current) use of aspirin: Secondary | ICD-10-CM | POA: Diagnosis not present

## 2015-06-16 DIAGNOSIS — Z87891 Personal history of nicotine dependence: Secondary | ICD-10-CM | POA: Insufficient documentation

## 2015-06-16 HISTORY — PX: LAPAROSCOPIC APPENDECTOMY: SHX408

## 2015-06-16 HISTORY — DX: Unspecified acute appendicitis: K35.80

## 2015-06-16 LAB — CBC WITH DIFFERENTIAL/PLATELET
Basophils Absolute: 0 10*3/uL (ref 0.0–0.1)
Basophils Relative: 0 %
EOS ABS: 0.2 10*3/uL (ref 0.0–0.7)
Eosinophils Relative: 1 %
HCT: 41.2 % (ref 39.0–52.0)
Hemoglobin: 14.6 g/dL (ref 13.0–17.0)
LYMPHS PCT: 8 %
Lymphs Abs: 1.4 10*3/uL (ref 0.7–4.0)
MCH: 32.2 pg (ref 26.0–34.0)
MCHC: 35.4 g/dL (ref 30.0–36.0)
MCV: 90.9 fL (ref 78.0–100.0)
MONO ABS: 1.8 10*3/uL — AB (ref 0.1–1.0)
Monocytes Relative: 10 %
NEUTROS PCT: 81 %
Neutro Abs: 14.7 10*3/uL — ABNORMAL HIGH (ref 1.7–7.7)
PLATELETS: 197 10*3/uL (ref 150–400)
RBC: 4.53 MIL/uL (ref 4.22–5.81)
RDW: 12.6 % (ref 11.5–15.5)
WBC: 18.1 10*3/uL — AB (ref 4.0–10.5)

## 2015-06-16 LAB — URINALYSIS, ROUTINE W REFLEX MICROSCOPIC
Bilirubin Urine: NEGATIVE
Glucose, UA: NEGATIVE mg/dL
Ketones, ur: 15 mg/dL — AB
NITRITE: NEGATIVE
PROTEIN: NEGATIVE mg/dL
Specific Gravity, Urine: 1.017 (ref 1.005–1.030)
pH: 5.5 (ref 5.0–8.0)

## 2015-06-16 LAB — COMPREHENSIVE METABOLIC PANEL
ALK PHOS: 52 U/L (ref 38–126)
ALT: 19 U/L (ref 17–63)
ANION GAP: 10 (ref 5–15)
AST: 16 U/L (ref 15–41)
Albumin: 4.3 g/dL (ref 3.5–5.0)
BUN: 20 mg/dL (ref 6–20)
CHLORIDE: 92 mmol/L — AB (ref 101–111)
CO2: 31 mmol/L (ref 22–32)
CREATININE: 0.88 mg/dL (ref 0.61–1.24)
Calcium: 9.5 mg/dL (ref 8.9–10.3)
Glucose, Bld: 111 mg/dL — ABNORMAL HIGH (ref 65–99)
Potassium: 3.9 mmol/L (ref 3.5–5.1)
Sodium: 133 mmol/L — ABNORMAL LOW (ref 135–145)
Total Bilirubin: 0.8 mg/dL (ref 0.3–1.2)
Total Protein: 7.4 g/dL (ref 6.5–8.1)

## 2015-06-16 LAB — URINE MICROSCOPIC-ADD ON

## 2015-06-16 LAB — SURGICAL PCR SCREEN
MRSA, PCR: NEGATIVE
STAPHYLOCOCCUS AUREUS: NEGATIVE

## 2015-06-16 SURGERY — APPENDECTOMY, LAPAROSCOPIC
Anesthesia: General | Site: Abdomen

## 2015-06-16 MED ORDER — LIDOCAINE HCL (CARDIAC) 20 MG/ML IV SOLN
INTRAVENOUS | Status: AC
  Filled 2015-06-16: qty 5

## 2015-06-16 MED ORDER — METRONIDAZOLE IN NACL 5-0.79 MG/ML-% IV SOLN
INTRAVENOUS | Status: AC
  Filled 2015-06-16: qty 100

## 2015-06-16 MED ORDER — POTASSIUM CHLORIDE IN NACL 20-0.9 MEQ/L-% IV SOLN
INTRAVENOUS | Status: DC
  Administered 2015-06-16 – 2015-06-17 (×2): 1000 mL via INTRAVENOUS
  Filled 2015-06-16 (×6): qty 1000

## 2015-06-16 MED ORDER — PANTOPRAZOLE SODIUM 40 MG PO TBEC
40.0000 mg | DELAYED_RELEASE_TABLET | Freq: Every day | ORAL | Status: DC
  Administered 2015-06-16 – 2015-06-17 (×2): 40 mg via ORAL
  Filled 2015-06-16 (×2): qty 1

## 2015-06-16 MED ORDER — PROPOFOL 10 MG/ML IV BOLUS
INTRAVENOUS | Status: AC
  Filled 2015-06-16: qty 20

## 2015-06-16 MED ORDER — FENTANYL CITRATE (PF) 100 MCG/2ML IJ SOLN
25.0000 ug | INTRAMUSCULAR | Status: DC | PRN
  Administered 2015-06-16: 50 ug via INTRAVENOUS

## 2015-06-16 MED ORDER — SUGAMMADEX SODIUM 200 MG/2ML IV SOLN
INTRAVENOUS | Status: DC | PRN
  Administered 2015-06-16: 200 mg via INTRAVENOUS

## 2015-06-16 MED ORDER — LISINOPRIL 10 MG PO TABS
10.0000 mg | ORAL_TABLET | Freq: Every day | ORAL | Status: DC
  Administered 2015-06-17: 10 mg via ORAL
  Filled 2015-06-16: qty 1

## 2015-06-16 MED ORDER — IOPAMIDOL (ISOVUE-300) INJECTION 61%
125.0000 mL | Freq: Once | INTRAVENOUS | Status: AC | PRN
  Administered 2015-06-16: 125 mL via INTRAVENOUS

## 2015-06-16 MED ORDER — ROCURONIUM BROMIDE 100 MG/10ML IV SOLN
INTRAVENOUS | Status: AC
  Filled 2015-06-16: qty 1

## 2015-06-16 MED ORDER — DEXTROSE 5 % IV SOLN: 1.0000 g | INTRAVENOUS | Status: DC

## 2015-06-16 MED ORDER — EPHEDRINE SULFATE 50 MG/ML IJ SOLN
INTRAMUSCULAR | Status: DC | PRN
  Administered 2015-06-16: 10 mg via INTRAVENOUS
  Administered 2015-06-16: 20 mg via INTRAVENOUS

## 2015-06-16 MED ORDER — FENTANYL CITRATE (PF) 100 MCG/2ML IJ SOLN
INTRAMUSCULAR | Status: DC | PRN
  Administered 2015-06-16: 50 ug via INTRAVENOUS
  Administered 2015-06-16: 100 ug via INTRAVENOUS

## 2015-06-16 MED ORDER — LACTATED RINGERS IR SOLN
Status: DC | PRN
  Administered 2015-06-16: 1

## 2015-06-16 MED ORDER — DEXTROSE 5 % IV SOLN
2.0000 g | INTRAVENOUS | Status: DC
  Administered 2015-06-16: 2 g via INTRAVENOUS
  Filled 2015-06-16 (×2): qty 2

## 2015-06-16 MED ORDER — PHENYLEPHRINE HCL 10 MG/ML IJ SOLN
INTRAMUSCULAR | Status: DC | PRN
  Administered 2015-06-16: 200 ug via INTRAVENOUS
  Administered 2015-06-16: 120 ug via INTRAVENOUS
  Administered 2015-06-16: 80 ug via INTRAVENOUS

## 2015-06-16 MED ORDER — ROCURONIUM BROMIDE 100 MG/10ML IV SOLN
INTRAVENOUS | Status: DC | PRN
  Administered 2015-06-16: 40 mg via INTRAVENOUS

## 2015-06-16 MED ORDER — BUPIVACAINE-EPINEPHRINE 0.25% -1:200000 IJ SOLN
INTRAMUSCULAR | Status: DC | PRN
  Administered 2015-06-16: 7 mL

## 2015-06-16 MED ORDER — BUPIVACAINE-EPINEPHRINE (PF) 0.25% -1:200000 IJ SOLN
INTRAMUSCULAR | Status: AC
  Filled 2015-06-16: qty 30

## 2015-06-16 MED ORDER — PROPOFOL 10 MG/ML IV BOLUS
INTRAVENOUS | Status: DC | PRN
  Administered 2015-06-16: 170 mg via INTRAVENOUS

## 2015-06-16 MED ORDER — OXYCODONE-ACETAMINOPHEN 5-325 MG PO TABS
1.0000 | ORAL_TABLET | ORAL | Status: DC | PRN
  Administered 2015-06-16 – 2015-06-17 (×2): 1 via ORAL
  Filled 2015-06-16 (×2): qty 1

## 2015-06-16 MED ORDER — SUGAMMADEX SODIUM 200 MG/2ML IV SOLN
INTRAVENOUS | Status: AC
Start: 2015-06-16 — End: 2015-06-16
  Filled 2015-06-16: qty 2

## 2015-06-16 MED ORDER — FENTANYL CITRATE (PF) 100 MCG/2ML IJ SOLN
INTRAMUSCULAR | Status: AC
  Filled 2015-06-16: qty 2

## 2015-06-16 MED ORDER — ONDANSETRON HCL 4 MG/2ML IJ SOLN
4.0000 mg | Freq: Four times a day (QID) | INTRAMUSCULAR | Status: DC | PRN
  Administered 2015-06-16: 4 mg via INTRAVENOUS

## 2015-06-16 MED ORDER — ONDANSETRON 4 MG PO TBDP: 4.0000 mg | ORAL_TABLET | Freq: Four times a day (QID) | ORAL | Status: DC | PRN

## 2015-06-16 MED ORDER — OXYBUTYNIN CHLORIDE ER 5 MG PO TB24
5.0000 mg | ORAL_TABLET | Freq: Every day | ORAL | Status: DC
  Administered 2015-06-16: 5 mg via ORAL
  Filled 2015-06-16 (×2): qty 1

## 2015-06-16 MED ORDER — SODIUM CHLORIDE 0.9 % IR SOLN
Status: DC | PRN
  Administered 2015-06-16: 1000 mL

## 2015-06-16 MED ORDER — SIMVASTATIN 10 MG PO TABS
10.0000 mg | ORAL_TABLET | Freq: Every evening | ORAL | Status: DC
  Filled 2015-06-16: qty 1

## 2015-06-16 MED ORDER — SUCCINYLCHOLINE CHLORIDE 20 MG/ML IJ SOLN
INTRAMUSCULAR | Status: DC | PRN
  Administered 2015-06-16: 100 mg via INTRAVENOUS

## 2015-06-16 MED ORDER — VASOPRESSIN 20 UNIT/ML IV SOLN
INTRAVENOUS | Status: AC
  Filled 2015-06-16: qty 1

## 2015-06-16 MED ORDER — SODIUM CHLORIDE 0.9 % IJ SOLN
INTRAMUSCULAR | Status: AC
  Filled 2015-06-16: qty 10

## 2015-06-16 MED ORDER — LACTATED RINGERS IV SOLN
INTRAVENOUS | Status: DC | PRN
  Administered 2015-06-16 (×2): via INTRAVENOUS

## 2015-06-16 MED ORDER — ONDANSETRON HCL 4 MG/2ML IJ SOLN: 4.0000 mg | Freq: Four times a day (QID) | INTRAMUSCULAR | Status: DC | PRN

## 2015-06-16 MED ORDER — LACTATED RINGERS IV SOLN
INTRAVENOUS | Status: DC
  Administered 2015-06-16: 1000 mL via INTRAVENOUS

## 2015-06-16 MED ORDER — METRONIDAZOLE IN NACL 5-0.79 MG/ML-% IV SOLN: 500.0000 mg | Freq: Three times a day (TID) | INTRAVENOUS | Status: DC

## 2015-06-16 MED ORDER — FINASTERIDE 5 MG PO TABS
5.0000 mg | ORAL_TABLET | Freq: Every day | ORAL | Status: DC
  Administered 2015-06-17: 5 mg via ORAL
  Filled 2015-06-16: qty 1

## 2015-06-16 MED ORDER — METRONIDAZOLE IN NACL 5-0.79 MG/ML-% IV SOLN
500.0000 mg | Freq: Three times a day (TID) | INTRAVENOUS | Status: AC
  Administered 2015-06-16 – 2015-06-17 (×3): 500 mg via INTRAVENOUS
  Filled 2015-06-16 (×3): qty 100

## 2015-06-16 MED ORDER — PHENYLEPHRINE 40 MCG/ML (10ML) SYRINGE FOR IV PUSH (FOR BLOOD PRESSURE SUPPORT)
PREFILLED_SYRINGE | INTRAVENOUS | Status: AC
  Filled 2015-06-16: qty 20

## 2015-06-16 MED ORDER — FENTANYL CITRATE (PF) 250 MCG/5ML IJ SOLN
INTRAMUSCULAR | Status: AC
  Filled 2015-06-16: qty 5

## 2015-06-16 MED ORDER — CHLORTHALIDONE 25 MG PO TABS
25.0000 mg | ORAL_TABLET | Freq: Every day | ORAL | Status: DC
  Administered 2015-06-17: 25 mg via ORAL
  Filled 2015-06-16: qty 1

## 2015-06-16 MED ORDER — ONDANSETRON HCL 4 MG/2ML IJ SOLN: 4.0000 mg | Freq: Once | INTRAMUSCULAR | Status: DC | PRN

## 2015-06-16 MED ORDER — SUMATRIPTAN SUCCINATE 100 MG PO TABS
100.0000 mg | ORAL_TABLET | Freq: Every day | ORAL | Status: DC | PRN
  Administered 2015-06-16: 100 mg via ORAL
  Filled 2015-06-16 (×2): qty 1

## 2015-06-16 MED ORDER — LIDOCAINE HCL (CARDIAC) 20 MG/ML IV SOLN
INTRAVENOUS | Status: DC | PRN
  Administered 2015-06-16: 100 mg via INTRAVENOUS

## 2015-06-16 MED ORDER — MORPHINE SULFATE (PF) 2 MG/ML IV SOLN: 2.0000 mg | INTRAVENOUS | Status: DC | PRN

## 2015-06-16 MED ORDER — EPHEDRINE SULFATE 50 MG/ML IJ SOLN
INTRAMUSCULAR | Status: AC
  Filled 2015-06-16: qty 1

## 2015-06-16 MED ORDER — VASOPRESSIN 20 UNIT/ML IV SOLN
2.5000 [IU] | Freq: Once | INTRAVENOUS | Status: AC
  Administered 2015-06-16: 1 [IU] via INTRAVENOUS

## 2015-06-16 MED ORDER — ONDANSETRON HCL 4 MG/2ML IJ SOLN
INTRAMUSCULAR | Status: AC
  Filled 2015-06-16: qty 2

## 2015-06-16 MED ORDER — SODIUM CHLORIDE 0.9 % IV SOLN
Freq: Once | INTRAVENOUS | Status: DC
  Administered 2015-06-16: 16:00:00 via INTRAVENOUS

## 2015-06-16 SURGICAL SUPPLY — 31 items
APPLIER CLIP 5 13 M/L LIGAMAX5 (MISCELLANEOUS)
APPLIER CLIP ROT 10 11.4 M/L (STAPLE)
CABLE HIGH FREQUENCY MONO STRZ (ELECTRODE) IMPLANT
CHLORAPREP W/TINT 26ML (MISCELLANEOUS) ×3 IMPLANT
CLIP APPLIE 5 13 M/L LIGAMAX5 (MISCELLANEOUS) IMPLANT
CLIP APPLIE ROT 10 11.4 M/L (STAPLE) IMPLANT
COVER SURGICAL LIGHT HANDLE (MISCELLANEOUS) ×6 IMPLANT
CUTTER FLEX LINEAR 45M (STAPLE) ×3 IMPLANT
DECANTER SPIKE VIAL GLASS SM (MISCELLANEOUS) ×3 IMPLANT
DRAPE LAPAROSCOPIC ABDOMINAL (DRAPES) ×3 IMPLANT
ELECT REM PT RETURN 9FT ADLT (ELECTROSURGICAL) ×3
ELECTRODE REM PT RTRN 9FT ADLT (ELECTROSURGICAL) ×1 IMPLANT
GLOVE BIOGEL PI IND STRL 7.5 (GLOVE) ×1 IMPLANT
GLOVE BIOGEL PI INDICATOR 7.5 (GLOVE) ×2
GLOVE ECLIPSE 7.5 STRL STRAW (GLOVE) ×3 IMPLANT
GOWN STRL REUS W/TWL XL LVL3 (GOWN DISPOSABLE) ×6 IMPLANT
KIT BASIN OR (CUSTOM PROCEDURE TRAY) ×3 IMPLANT
LIQUID BAND (GAUZE/BANDAGES/DRESSINGS) IMPLANT
POUCH SPECIMEN RETRIEVAL 10MM (ENDOMECHANICALS) ×3 IMPLANT
RELOAD 45 VASCULAR/THIN (ENDOMECHANICALS) IMPLANT
RELOAD STAPLE TA45 3.5 REG BLU (ENDOMECHANICALS) ×3 IMPLANT
SCISSORS LAP 5X35 DISP (ENDOMECHANICALS) ×3 IMPLANT
SET IRRIG TUBING LAPAROSCOPIC (IRRIGATION / IRRIGATOR) ×3 IMPLANT
SHEARS HARMONIC ACE PLUS 36CM (ENDOMECHANICALS) ×3 IMPLANT
SLEEVE XCEL OPT CAN 5 100 (ENDOMECHANICALS) ×3 IMPLANT
SUT MNCRL AB 4-0 PS2 18 (SUTURE) ×3 IMPLANT
TOWEL OR 17X26 10 PK STRL BLUE (TOWEL DISPOSABLE) ×3 IMPLANT
TRAY FOLEY W/METER SILVER 16FR (SET/KITS/TRAYS/PACK) ×3 IMPLANT
TRAY LAPAROSCOPIC (CUSTOM PROCEDURE TRAY) ×3 IMPLANT
TROCAR BLADELESS OPT 5 100 (ENDOMECHANICALS) ×3 IMPLANT
TROCAR XCEL BLUNT TIP 100MML (ENDOMECHANICALS) ×3 IMPLANT

## 2015-06-16 NOTE — ED Notes (Signed)
Bed: WA17 Expected date:  Expected time:  Means of arrival:  Comments: Tr 1 

## 2015-06-16 NOTE — Transfer of Care (Signed)
Immediate Anesthesia Transfer of Care Note  Patient: Colton Cummings  Procedure(s) Performed: Procedure(s): APPENDECTOMY LAPAROSCOPIC (N/A)  Patient Location: PACU  Anesthesia Type:General  Level of Consciousness: awake, alert  and oriented  Airway & Oxygen Therapy: Patient Spontanous Breathing and Patient connected to face mask oxygen  Post-op Assessment: Report given to RN  Post vital signs: Reviewed  Last Vitals:  Filed Vitals:   06/16/15 1358 06/16/15 1607  BP: 120/70 128/73  Pulse: 90 85  Temp: 37.1 C 37 C  Resp: 15 16    Complications: No apparent anesthesia complications

## 2015-06-16 NOTE — ED Notes (Signed)
Pt went to MD today for abdominal pain. Went for CT scan and told appendicitis.  On arrival to ER, Surgeon at bedside prior to triage.  Triage delayed

## 2015-06-16 NOTE — H&P (Signed)
Colton Cummings is an 70 y.o. male.   PCP: Marylene Land, MD  Chief Complaint: RLQ abdominal pain HPI: Colton Cummings is a 70 y/o male who presents to the ED today with RLQ abdominal pain. Pain began around 8pm Monday evening without associated N/V. He saw PCP around 5pm Tuesday evening after spending a majority of the day in bed with ongoing pain.  He ate chicken for dinner and had 1 episode of nausea with emesis. He denies fever.  He had his CT scan this Am and ate a small amount of rice around 11AM.   We are consulting for surgery, Work-up by Dr. Sandi Mariscal showed an elevated WBC of 15.7. Abdominal CT showed inflamed appendix consistent with acute appendicitis. Labs in ED this afternoon shows the WBC is 18.1, CMP is essentially normal.    Past Medical History  Diagnosis Date  . Arthritis   . HTN (hypertension)   . GERD (gastroesophageal reflux disease)   . Anal polyp 2013  . Hyperlipidemia   . Wears glasses   . Dental crowns present     Past Surgical History  Procedure Laterality Date  . Rectal polypectomy  01/11/2012    Excision of anal canal mass.  . Hemorrhoid surgery  01/11/2012    Internal hemorrhoid ligation  . Tonsillectomy    . Colonoscopy    . Ulnar nerve transposition Left 03/31/2014    Procedure: DECOMPRESSION  LEFT ULNAR NERVE;  Surgeon: Daryll Brod, MD;  Location: [facility];  Service: Orthopedics;  Laterality: Left;    Family History  Problem Relation Age of Onset  . Colon cancer Neg Hx   . Esophageal cancer Neg Hx   . Rectal cancer Neg Hx   . Stomach cancer Neg Hx    Social History: >100 pack year smoking hx.  reports that he quit smoking about 15 years ago. His smoking use included Cigarettes. He has never used smokeless tobacco. He reports that he drinks about 0.5 - 1.0 oz of alcohol per week. He reports that he does not use illicit drugs.  Allergies: No Known Allergies  Prior to Admission medications   Medication Sig Start Date End Date  Taking? Authorizing Provider  aspirin 81 MG tablet Take 81 mg by mouth daily.    Historical Provider, MD  chlorpheniramine-HYDROcodone (TUSSIONEX PENNKINETIC ER) 10-8 MG/5ML LQCR Take 5 mLs by mouth every 12 (twelve) hours as needed for cough.    Historical Provider, MD  chlorthalidone (HYGROTON) 25 MG tablet Take 25 mg by mouth daily.  12/08/11   Historical Provider, MD  Ferrous Sulfate Dried (FEOSOL) 200 (65 FE) MG TABS Take one tab twice a day 07/10/12   Inda Castle, MD  finasteride (PROSCAR) 5 MG tablet Take 5 mg by mouth daily.  12/08/11   Historical Provider, MD  guaiFENesin (MUCINEX) 600 MG 12 hr tablet Take by mouth 2 (two) times daily.    Historical Provider, MD  HYDROcodone-acetaminophen (NORCO) 5-325 MG per tablet Take 1 tablet by mouth every 6 (six) hours as needed for moderate pain. 03/31/14   Daryll Brod, MD  IBUPROFEN PO Take by mouth as needed.     Historical Provider, MD  lisinopril (PRINIVIL,ZESTRIL) 10 MG tablet Take 10 mg by mouth daily.  12/08/11   Historical Provider, MD  loratadine (CLARITIN) 10 MG tablet Take 10 mg by mouth daily.    Historical Provider, MD  omeprazole (PRILOSEC) 20 MG capsule Take 20 mg by mouth daily.  12/08/11   Historical  Provider, MD  simvastatin (ZOCOR) 10 MG tablet Take 10 mg by mouth at bedtime.  12/08/11   Historical Provider, MD  Tamsulosin HCl (FLOMAX) 0.4 MG CAPS 0.4 mg daily after supper.  12/08/11   Historical Provider, MD  tolterodine (DETROL) 2 MG tablet Take 2 mg by mouth.  12/08/11   Historical Provider, MD      Results for orders placed or performed during the hospital encounter of 06/16/15 (from the past 48 hour(s))  Comprehensive metabolic panel     Status: Abnormal   Collection Time: 06/16/15  3:05 PM  Result Value Ref Range   Sodium 133 (L) 135 - 145 mmol/L   Potassium 3.9 3.5 - 5.1 mmol/L   Chloride 92 (L) 101 - 111 mmol/L   CO2 31 22 - 32 mmol/L   Glucose, Bld 111 (H) 65 - 99 mg/dL   BUN 20 6 - 20 mg/dL   Creatinine, Ser 0.88  0.61 - 1.24 mg/dL   Calcium 9.5 8.9 - 10.3 mg/dL   Total Protein 7.4 6.5 - 8.1 g/dL   Albumin 4.3 3.5 - 5.0 g/dL   AST 16 15 - 41 U/L   ALT 19 17 - 63 U/L   Alkaline Phosphatase 52 38 - 126 U/L   Total Bilirubin 0.8 0.3 - 1.2 mg/dL   GFR calc non Af Amer >60 >60 mL/min   GFR calc Af Amer >60 >60 mL/min    Comment: (NOTE) The eGFR has been calculated using the CKD EPI equation. This calculation has not been validated in all clinical situations. eGFR's persistently <60 mL/min signify possible Chronic Kidney Disease.    Anion gap 10 5 - 15  CBC with Differential     Status: Abnormal (Preliminary result)   Collection Time: 06/16/15  3:05 PM  Result Value Ref Range   WBC 18.1 (H) 4.0 - 10.5 K/uL   RBC 4.53 4.22 - 5.81 MIL/uL   Hemoglobin 14.6 13.0 - 17.0 g/dL   HCT 41.2 39.0 - 52.0 %   MCV 90.9 78.0 - 100.0 fL   MCH 32.2 26.0 - 34.0 pg   MCHC 35.4 30.0 - 36.0 g/dL   RDW 12.6 11.5 - 15.5 %   Platelets 197 150 - 400 K/uL   Neutrophils Relative % PENDING %   Neutro Abs PENDING 1.7 - 7.7 K/uL   Band Neutrophils PENDING %   Lymphocytes Relative PENDING %   Lymphs Abs PENDING 0.7 - 4.0 K/uL   Monocytes Relative PENDING %   Monocytes Absolute PENDING 0.1 - 1.0 K/uL   Eosinophils Relative PENDING %   Eosinophils Absolute PENDING 0.0 - 0.7 K/uL   Basophils Relative PENDING %   Basophils Absolute PENDING 0.0 - 0.1 K/uL   WBC Morphology PENDING    RBC Morphology PENDING    Smear Review PENDING    nRBC PENDING 0 /100 WBC   Metamyelocytes Relative PENDING %   Myelocytes PENDING %   Promyelocytes Absolute PENDING %   Blasts PENDING %   Ct Abdomen Pelvis W Contrast  06/16/2015  CLINICAL DATA:  Right lower and mid abdomen pain, began antibiotics 1 day ago EXAM: CT ABDOMEN AND PELVIS WITH CONTRAST TECHNIQUE: Multidetector CT imaging of the abdomen and pelvis was performed using the standard protocol following bolus administration of intravenous contrast. CONTRAST:  174m ISOVUE-300  IOPAMIDOL (ISOVUE-300) INJECTION 61% COMPARISON:  CT chest of 01/03/2012 and 04/16/2015 FINDINGS: On lung window images, the 7 mm nodule noted previously within the right middle lobe is  completely stable since study from 2013 and therefore consistent with a benign process. The liver enhances and there is a single small low-attenuation structure within the left lobe of liver measuring 10 mm in diameter which is not definitely seen previously. This most likely represents a benign process, but ultrasound of the liver may be helpful to assess further. No ductal dilatation is seen. No calcified gallstones are noted. The pancreas is normal in size and the pancreatic duct is not dilated. The adrenal glands and spleen are unremarkable. The stomach is not well distended and no abnormality is seen. The kidneys enhance in a cyst is again noted in the upper pole of the right kidney measuring 4.2 cm in diameter. No renal calculi are seen and on delayed images, the pelvocaliceal systems are unremarkable. The abdominal aorta is normal in caliber with moderate atherosclerotic change present. Within the right lower quadrant there is a there is an inflammatory process with an edematous appearance of the appendix consistent with acute appendicitis. No discrete abscess is seen and there is no evidence of perforation. The urinary bladder is unremarkable. The prostate is prominent measuring 4.7 x 6.0 cm. The mucosa of the rectum appears somewhat thickened and clinical correlation is recommended to assess further. The colon is largely decompressed. The terminal ileum is unremarkable. There are sclerotic bony lesions present within the iliac wings of uncertain significance. Metastatic disease cannot be excluded in the proper clinical setting. There are degenerative changes throughout the thoracic spine. IMPRESSION: 1. Findings consistent with acute appendicitis. No current abscess or evidence of perforation. 2. Prominent prostate. 3.  Somewhat prominent mucosa of the rectum of uncertain significance. Correlate clinically. 4. There are a few sclerotic bony lesions within the iliac wings of questionable significance. Metastatic involvement cannot be excluded. Electronically Signed   By: Ivar Drape M.D.   On: 06/16/2015 12:10    Review of Systems  Constitutional: Positive for weight loss. Negative for fever.  HENT: Positive for hearing loss. Negative for congestion.   Eyes: Negative.   Respiratory: Negative for cough and shortness of breath.   Cardiovascular: Negative for chest pain, palpitations, orthopnea, leg swelling and PND.  Gastrointestinal: Positive for nausea, vomiting and abdominal pain. Negative for heartburn, diarrhea, constipation and blood in stool.  Musculoskeletal: Positive for myalgias, back pain and joint pain.  Neurological: Positive for headaches. Negative for dizziness and seizures.  Psychiatric/Behavioral: Negative for depression. The patient is not nervous/anxious.   BP 120/70 mmHg  Pulse 90  Temp(Src) 98.7 F (37.1 C) (Oral)  Resp 15  Ht 6' 2"  (1.88 m)  Wt 99.791 kg (220 lb)  BMI 28.23 kg/m2  SpO2 96%   Physical Exam  Constitutional: He is oriented to person, place, and time. He appears well-developed and well-nourished. No distress.  HENT:  Head: Normocephalic and atraumatic.  Mouth/Throat: Oropharynx is clear and moist.  Wears hearing aids.  Eyes: Pupils are equal, round, and reactive to light. No scleral icterus.  Wears glasses.  Neck: Neck supple.  Cardiovascular: Normal rate, regular rhythm, normal heart sounds and intact distal pulses.  Exam reveals no gallop and no friction rub.   No murmur heard. Respiratory: Effort normal and breath sounds normal.  GI: Soft. Bowel sounds are normal. He exhibits no distension. There is tenderness.  Lymphadenopathy:    He has no cervical adenopathy.  Neurological: He is alert and oriented to person, place, and time.  Skin: Skin is warm and dry.   Psychiatric: He has a normal mood and  affect.     Assessment/Plan Acute appendicitis HTN Hyperlipidemia >100 pack-year smoking history; quit 15 years ago  Plan:  1. Admit to Med-Surg   2. NPO, IVF, IV abx  3. Surgery later today    Justice Deeds 06/16/2015, 3:48 PM

## 2015-06-16 NOTE — ED Provider Notes (Signed)
MSE:  Colton Cummings was referred to the emergency department following CT scan that demonstrated acute appendicitis. His primary care provider contacted the surgery team who evaluated the patient emergency department. He reports 2 days of right lower quadrant pain, decreased oral intake, nausea, vomiting once. Patient is hemodynamically stable, plan to admit for further treatment.  Quintella Reichert, MD 06/16/15 [phone]

## 2015-06-16 NOTE — ED Notes (Signed)
Patient denies pain and is resting comfortably.  

## 2015-06-16 NOTE — ED Notes (Signed)
MD at bedside. 

## 2015-06-16 NOTE — ED Notes (Signed)
Patient transported to X-ray 

## 2015-06-16 NOTE — Anesthesia Procedure Notes (Signed)
Procedure Name: Intubation Date/Time: 06/16/2015 7:01 PM Performed by: Talbot Grumbling Pre-anesthesia Checklist: Patient identified, Emergency Drugs available, Suction available and Patient being monitored Patient Re-evaluated:Patient Re-evaluated prior to inductionOxygen Delivery Method: Circle system utilized Preoxygenation: Pre-oxygenation with 100% oxygen Intubation Type: IV induction Ventilation: Mask ventilation without difficulty Laryngoscope Size: Mac and 3 Grade View: Grade II Tube type: Oral Number of attempts: 2 Airway Equipment and Method: Stylet Placement Confirmation: ETT inserted through vocal cords under direct vision,  positive ETCO2 and breath sounds checked- equal and bilateral Secured at: 22 cm Tube secured with: Tape Dental Injury: Teeth and Oropharynx as per pre-operative assessment

## 2015-06-16 NOTE — Anesthesia Preprocedure Evaluation (Addendum)
Anesthesia Evaluation  Patient identified by MRN, date of birth, ID band Patient awake    Reviewed: Allergy & Precautions, NPO status , Patient's Chart, lab work & pertinent test results  History of Anesthesia Complications Negative for: history of anesthetic complications  Airway Mallampati: II  TM Distance: >3 FB Neck ROM: Full    Dental  (+) Teeth Intact, Dental Advisory Given Permanent lower bridge, implants:   Pulmonary former smoker,    Pulmonary exam normal breath sounds clear to auscultation       Cardiovascular Exercise Tolerance: Good hypertension, Pt. on medications (-) angina(-) CAD, (-) Past MI and (-) CHF Normal cardiovascular exam Rhythm:Regular Rate:Normal     Neuro/Psych negative neurological ROS  negative psych ROS   GI/Hepatic Neg liver ROS, GERD  Medicated and Controlled,  Endo/Other  negative endocrine ROS  Renal/GU negative Renal ROS     Musculoskeletal  (+) Arthritis , Osteoarthritis,    Abdominal   Peds  Hematology negative hematology ROS (+)   Anesthesia Other Findings Day of surgery medications reviewed with the patient.  Reproductive/Obstetrics                            Anesthesia Physical Anesthesia Plan  ASA: II and emergent  Anesthesia Plan: General   Post-op Pain Management:    Induction: Intravenous  Airway Management Planned: Oral ETT  Additional Equipment:   Intra-op Plan:   Post-operative Plan: Extubation in OR  Informed Consent: I have reviewed the patients History and Physical, chart, labs and discussed the procedure including the risks, benefits and alternatives for the proposed anesthesia with the patient or authorized representative who has indicated his/her understanding and acceptance.   Dental advisory given  Plan Discussed with: CRNA  Anesthesia Plan Comments: (Risks/benefits of general anesthesia discussed with patient  including risk of damage to teeth, lips, gum, and tongue, nausea/vomiting, allergic reactions to medications, and the possibility of heart attack, stroke and death.  All patient questions answered.  Patient wishes to proceed.  Last ate at 11am.)       Anesthesia Quick Evaluation

## 2015-06-16 NOTE — ED Notes (Signed)
CONSENT SIGNED

## 2015-06-16 NOTE — Anesthesia Postprocedure Evaluation (Signed)
Anesthesia Post Note  Patient: Colton Cummings  Procedure(s) Performed: Procedure(s) (LRB): APPENDECTOMY LAPAROSCOPIC (N/A)  Patient location during evaluation: PACU Anesthesia Type: General Level of consciousness: awake and alert Pain management: pain level controlled Vital Signs Assessment: post-procedure vital signs reviewed and stable Respiratory status: spontaneous breathing, nonlabored ventilation, respiratory function stable and patient connected to nasal cannula oxygen Cardiovascular status: blood pressure returned to baseline and stable Postop Assessment: no signs of nausea or vomiting Anesthetic complications: no    Last Vitals:  Filed Vitals:   06/16/15 1859 06/16/15 1900  BP:  135/76  Pulse: 105 104  Temp:  36.9 C  Resp:  18    Last Pain:  Filed Vitals:   06/16/15 1909  PainSc: 0-No pain                 Catalina Gravel

## 2015-06-16 NOTE — Op Note (Signed)
Preoperative Diagnosis: Pre-op exam [Z01.818] Acute appendicitis, uncomplicated Q000111Q  Postoprative Diagnosis: Pre-op exam [Z01.818] Acute appendicitis, uncomplicated [id number]  Procedure: Procedure(s): APPENDECTOMY LAPAROSCOPIC   Surgeon: Excell Seltzer T   Assistants: None  Anesthesia:  General endotracheal anesthesia  Indications: Patient is a 70 year old male who presents with 2 days of progressive right lower quadrant abdominal pain and nausea and vomiting. CT scan was obtained in the emergency department showing evidence of acute appendicitis. He has received preoperative IV antibiotics. We recommended proceeding with emergency laparoscopic appendectomy. The procedure and risks and alternatives were discussed with the patient detailed elsewhere and he agrees to proceed.    Procedure Detail:  Patient was brought to the operating room, placed in supine position on the operating table, and general endotracheal anesthesia induced. PAS were placed.The abdomen was widely sterilely prepped and draped. Foley catheter was placed. Patient timeout was performed and correct procedure verified. Trocar sites were infiltrated with local anesthesia. Access was obtained with a 1/2 cm incision at the umbilicus with an open Hassan technique through a mattress suture of 0 Vicryl and pneumoperitoneum established. Under direct vision 5 mm trochars were placed in the right upper quadrant and left lower quadrant. The appendix was visualized lying just lateral and beneath the cecum. It was markedly acutely inflamed with exudate and somewhat walled off against the lateral peritoneum. It was mobilized with careful blunt dissection and elevated. There was no perforation or gangrene. The mesial appendix was exposed and was sequentially divided with the Harmonic scalpel until the appendix was completely freed down to the tip of the cecum. It was relatively noninflamed at the base. The appendix was then divided  across the tip of the cecum with a single firing of the GIA 45 mm blue load stapler. The appendix was placed in an Endo Catch bag and brought out through the umbilical incision. The operative site was thoroughly irrigated as was the right paracolic gutter and pelvic space and subdiaphragmatic space on the right. There was no evidence of any generalized uroliths or peritonitis. There was no bleeding. All CO2 was evacuated and the mattress suture secured at the umbilicus after removing all trochars. Skin incisions were closed with subcuticular Monocryl and Liquiban. Sponge needle and instrument counts were correct.    Findings: Acute Suppurative appendicitis without gangrene or perforation  Estimated Blood Loss:  Minimal         Drains: none  Blood Given: none          Specimens: Appendix        Complications:  * No complications entered in OR log *         Disposition: PACU - hemodynamically stable.         Condition: stable

## 2015-06-17 ENCOUNTER — Encounter (HOSPITAL_COMMUNITY): Payer: Self-pay | Admitting: General Surgery

## 2015-06-17 LAB — CBC
HEMATOCRIT: 37.8 % — AB (ref 39.0–52.0)
Hemoglobin: 12.8 g/dL — ABNORMAL LOW (ref 13.0–17.0)
MCH: 31.7 pg (ref 26.0–34.0)
MCHC: 33.9 g/dL (ref 30.0–36.0)
MCV: 93.6 fL (ref 78.0–100.0)
Platelets: 174 10*3/uL (ref 150–400)
RBC: 4.04 MIL/uL — ABNORMAL LOW (ref 4.22–5.81)
RDW: 12.8 % (ref 11.5–15.5)
WBC: 11.1 10*3/uL — ABNORMAL HIGH (ref 4.0–10.5)

## 2015-06-17 MED ORDER — IBUPROFEN 200 MG PO TABS: ORAL_TABLET | ORAL | Status: DC

## 2015-06-17 MED ORDER — ACETAMINOPHEN 325 MG PO TABS: 650.0000 mg | ORAL_TABLET | Freq: Four times a day (QID) | ORAL | Status: DC | PRN

## 2015-06-17 MED ORDER — OXYCODONE-ACETAMINOPHEN 5-325 MG PO TABS: 1.0000 | ORAL_TABLET | ORAL | Status: DC | PRN

## 2015-06-17 NOTE — Addendum Note (Signed)
Addendum  created 06/17/15 [id number] by Lollie Sails, CRNA   Modules edited: Charges VN

## 2015-06-17 NOTE — Discharge Instructions (Signed)
Laparoscopic Appendectomy, Adult, Care After °Refer to this sheet in the next few weeks. These instructions provide you with information on caring for yourself after your procedure. Your caregiver may also give you more specific instructions. Your treatment has been planned according to current medical practices, but problems sometimes occur. Call your caregiver if you have any problems or questions after your procedure. °HOME CARE INSTRUCTIONS °· Do not drive while taking narcotic pain medicines. °· Use stool softener if you become constipated from your pain medicines. °· Change your bandages (dressings) as directed. °· Keep your wounds clean and dry. You may wash the wounds gently with soap and water. Gently pat the wounds dry with a clean towel. °· Do not take baths, swim, or use hot tubs for 10 days, or as instructed by your caregiver. °· Only take over-the-counter or prescription medicines for pain, discomfort, or fever as directed by your caregiver. °· You may continue your normal diet as directed. °· Do not lift more than 10 pounds (4.5 kg) or play contact sports for 3 weeks, or as directed. °· Slowly increase your activity after surgery. °· Take deep breaths to avoid getting a lung infection (pneumonia). °SEEK MEDICAL CARE IF: °· You have redness, swelling, or increasing pain in your wounds. °· You have pus coming from your wounds. °· You have drainage from a wound that lasts longer than 1 day. °· You notice a bad smell coming from the wounds or dressing. °· Your wound edges break open after stitches (sutures) have been removed. °· You notice increasing pain in the shoulders (shoulder strap areas) or near your shoulder blades. °· You develop dizzy episodes or fainting while standing. °· You develop shortness of breath. °· You develop persistent nausea or vomiting. °· You cannot control your bowel functions or lose your appetite. °· You develop diarrhea. °SEEK IMMEDIATE MEDICAL CARE IF:  °· You have a  fever. °· You develop a rash. °· You have difficulty breathing or sharp pains in your chest. °· You develop any reaction or side effects to medicines given. °MAKE SURE YOU: °· Understand these instructions. °· Will watch your condition. °· Will get help right away if you are not doing well or get worse. °  °This information is not intended to replace advice given to you by your health care provider. Make sure you discuss any questions you have with your health care provider. °  °Document Released: 03/27/2005 Document Revised: 08/11/2014 Document Reviewed: 09/14/2014 °Elsevier Interactive Patient Education ©2016 [employer] ° °CCS ______[facility] °LAPAROSCOPIC SURGERY: POST OP INSTRUCTIONS °Always review your discharge instruction sheet given to you by the facility where your surgery was performed. °IF YOU HAVE DISABILITY OR FAMILY LEAVE FORMS, YOU MUST BRING THEM TO THE OFFICE FOR PROCESSING.   °DO NOT GIVE THEM TO YOUR DOCTOR. ° °1. A prescription for pain medication may be given to you upon discharge.  Take your pain medication as prescribed, if needed.  If narcotic pain medicine is not needed, then you may take acetaminophen (Tylenol) or ibuprofen (Advil) as needed. °2. Take your usually prescribed medications unless otherwise directed. °3. If you need a refill on your pain medication, please contact your pharmacy.  They will contact our office to request authorization. Prescriptions will not be filled after 5pm or on week-ends. °4. You should follow a light diet the first few days after arrival home, such as soup and crackers, etc.  Be sure to include lots of fluids daily. °5. Most   patients will experience some swelling and bruising in the area of the incisions.  Ice packs will help.  Swelling and bruising can take several days to resolve.  °6. It is common to experience some constipation if taking pain medication after surgery.  Increasing fluid intake and taking a stool softener (such as  Colace) will usually help or prevent this problem from occurring.  A mild laxative (Milk of Magnesia or Miralax) should be taken according to package instructions if there are no bowel movements after 48 hours. °7. Unless discharge instructions indicate otherwise, you may remove your bandages 24-48 hours after surgery, and you may shower at that time.  You may have steri-strips (small skin tapes) in place directly over the incision.  These strips should be left on the skin for 7-10 days.  If your surgeon used skin glue on the incision, you may shower in 24 hours.  The glue will flake off over the next 2-3 weeks.  Any sutures or staples will be removed at the office during your follow-up visit. °8. ACTIVITIES:  You may resume regular (light) daily activities beginning the next day--such as daily self-care, walking, climbing stairs--gradually increasing activities as tolerated.  You may have sexual intercourse when it is comfortable.  Refrain from any heavy lifting or straining until approved by your doctor. °a. You may drive when you are no longer taking prescription pain medication, you can comfortably wear a seatbelt, and you can safely maneuver your car and apply brakes. °b. RETURN TO WORK:  __________________________________________________________ °9. You should see your doctor in the office for a follow-up appointment approximately 2-3 weeks after your surgery.  Make sure that you call for this appointment within a day or two after you arrive home to insure a convenient appointment time. °10. OTHER INSTRUCTIONS: __________________________________________________________________________________________________________________________ __________________________________________________________________________________________________________________________ °WHEN TO CALL YOUR DOCTOR: °1. Fever over 101.0 °2. Inability to urinate °3. Continued bleeding from incision. °4. Increased pain, redness, or drainage from the  incision. °5. Increasing abdominal pain ° °The clinic staff is available to answer your questions during regular business hours.  Please don’t hesitate to call and ask to speak to one of the nurses for clinical concerns.  If you have a medical emergency, go to the nearest emergency room or call 911.  A surgeon from [facility] is always on call at the hospital. °[address], [address], [place], [place]  [address] ? [address] [address], [place], [place]   [address] °[phone] ? [phone] ? FAX [phone] °Web site: [email or web address] ° °

## 2015-06-17 NOTE — Progress Notes (Signed)
Discharge instructions given to patient.  Prescription provided.  Questions answered

## 2015-06-18 NOTE — Discharge Summary (Signed)
Physician Discharge Summary  Patient ID: Colton Cummings MRN: [id number] DOB/AGE: 09/06/1945 70 y.o.  Admit date: 06/16/2015 Discharge date: 06/18/2015  Admission Diagnoses: Acute appendicitis  Discharge Diagnoses: Acute appendicitis Active Problems:   Acute appendicitis   PROCEDURES: Laparascopic appendectomy (06/16/2015 Dr. Marland Kitchen [facility])  Hospital Course:  Colton Cummings is a 70 y/o male who presented to the [facility] Wednesday with RLQ abdominal pain. Pain began around 8pm Monday evening without associated N/V. He saw PCP around 5pm Tuesday evening after spending a majority of the day in bed with ongoing pain. He ate chicken for dinner and had 1 episode of nausea with emesis. He denied fever. Ate a small amount of rice around 11AM on Wednesday.  Work-up by Dr. Sandi Mariscal (PCP) included CBC and abdominal CT. CBC showed an elevated WBC of 15.7. Abdominal CT showed inflamed appendix consistent with acute appendicitis.  We were consulted for surgery in the ED on Wednesday 06/16/2015. The patient was admitted to the hospital on 06/16/2015 and underwent the above surgery. The patient tolerated the procedure well without complications and was transferred to the floor. On reevaluation the following morning, the WBC decreased from 18.1 to 11.1 and the patient had improved abdominal pain. Diet was advanced as tolerated.  On POD #1, the patient was voiding well, tolerating diet, ambulating well, pain well controlled, vital signs stable, incisions c/d/i and felt stable for discharge home.  Patient will follow up in our office in 2-3 weeks and knows to call with questions or concerns.   Disposition: 01-Home or Self Care     Medication List    STOP taking these medications        ciprofloxacin 500 MG tablet  Commonly known as:  CIPRO      TAKE these medications        acetaminophen 325 MG tablet  Commonly known as:  TYLENOL  Take 2 tablets (650 mg total) by mouth every 6 (six) hours as needed.      aspirin 81 MG tablet  Take 81 mg by mouth daily.     chlorthalidone 25 MG tablet  Commonly known as:  HYGROTON  Take 25 mg by mouth daily.     cholecalciferol 1000 units tablet  Commonly known as:  VITAMIN D  Take 1,000 Units by mouth daily.     finasteride 5 MG tablet  Commonly known as:  PROSCAR  Take 5 mg by mouth daily.     HYDROcodone-acetaminophen 5-325 MG tablet  Commonly known as:  NORCO  Take 1 tablet by mouth every 6 (six) hours as needed for moderate pain.     ibuprofen 800 MG tablet  Commonly known as:  ADVIL,MOTRIN  Take 800 mg by mouth every 8 (eight) hours as needed for headache or moderate pain.     ibuprofen 200 MG tablet  Commonly known as:  MOTRIN IB  You can take 2-3 every 6 hours as needed for pain.  Do not use this if you are taking your higher dose for Migraines.     lisinopril 10 MG tablet  Commonly known as:  PRINIVIL,ZESTRIL  Take 10 mg by mouth daily.     loratadine 10 MG tablet  Commonly known as:  CLARITIN  Take 10 mg by mouth daily as needed for allergies.     multivitamin with minerals Tabs tablet  Take 1 tablet by mouth daily.     omeprazole 20 MG capsule  Commonly known as:  PRILOSEC  Take 20 mg by mouth daily.  oxyCODONE-acetaminophen 5-325 MG tablet  Commonly known as:  PERCOCET/ROXICET  Take 1-2 tablets by mouth every 4 (four) hours as needed for moderate pain.     simvastatin 10 MG tablet  Commonly known as:  ZOCOR  Take 10 mg by mouth daily.     SUMAtriptan 100 MG tablet  Commonly known as:  IMITREX  Take 100 mg by mouth daily as needed for migraine.     tolterodine 2 MG tablet  Commonly known as:  DETROL  Take 2 mg by mouth every evening.           Follow-up Information    Follow up with [facility] On 07/07/2015.   Specialty:  General Surgery   Why:  Your appointment is at 9:15 AM, be at the office 30 minutes early for check in.   Contact information:   [address] [place]  [address] [phone]       Signed: Earnstine Regal 06/18/2015, 1:15 PM

## 2016-04-10 HISTORY — PX: CATARACT EXTRACTION W/ INTRAOCULAR LENS  IMPLANT, BILATERAL: SHX1307

## 2016-05-22 ENCOUNTER — Other Ambulatory Visit: Payer: Self-pay | Admitting: Family Medicine

## 2016-05-22 DIAGNOSIS — R102 Pelvic and perineal pain: Secondary | ICD-10-CM

## 2016-05-23 ENCOUNTER — Ambulatory Visit: Payer: Medicare Other | Admitting: *Deleted

## 2016-05-23 ENCOUNTER — Encounter: Payer: Self-pay | Admitting: Internal Medicine

## 2016-05-23 VITALS — Ht 73.5 in | Wt 238.6 lb

## 2016-05-23 DIAGNOSIS — Z86010 Personal history of colonic polyps: Secondary | ICD-10-CM

## 2016-05-23 NOTE — Progress Notes (Signed)
Denies allergies to eggs or soy products. Denies complications with sedation or anesthesia. Denies O2 use. Denies use of diet or weight loss medications.  Emmi instructions given for colonoscopy.  

## 2016-05-26 ENCOUNTER — Ambulatory Visit
Admission: RE | Admit: 2016-05-26 | Discharge: 2016-05-26 | Disposition: A | Discharge status: Home / Self Care | Payer: Medicare Other | Source: Ambulatory Visit | Attending: Family Medicine | Admitting: Family Medicine

## 2016-05-26 DIAGNOSIS — R102 Pelvic and perineal pain: Secondary | ICD-10-CM

## 2016-06-06 ENCOUNTER — Ambulatory Visit (AMBULATORY_SURGERY_CENTER): Payer: Medicare Other | Admitting: Internal Medicine

## 2016-06-06 ENCOUNTER — Encounter: Payer: Self-pay | Admitting: Internal Medicine

## 2016-06-06 VITALS — BP 113/76 | HR 64 | Temp 96.9°F | Resp 17 | Ht 73.0 in | Wt 238.0 lb

## 2016-06-06 DIAGNOSIS — D123 Benign neoplasm of transverse colon: Secondary | ICD-10-CM

## 2016-06-06 DIAGNOSIS — D120 Benign neoplasm of cecum: Secondary | ICD-10-CM

## 2016-06-06 DIAGNOSIS — Z86010 Personal history of colonic polyps: Secondary | ICD-10-CM

## 2016-06-06 MED ORDER — SODIUM CHLORIDE 0.9 % IV SOLN: 500.0000 mL | INTRAVENOUS | Status: DC

## 2016-06-06 NOTE — Patient Instructions (Addendum)
I found and removed 4 polyps today - I think one was part of one removed in past.  All look benign.  I will let you know pathology results and when to have another routine colonoscopy by mail. Will probably need some closer follow-up on the largest polyp I.e. Within 1 year.  I appreciate the opportunity to care for you. Gatha Mayer, MD, [facility]  Impression/Recommendations:  Polyp handout given to patient. Hemorrhoid handout given to patient.  No aspirin, ibuprofen, naproxen, or other NSAIDS for 2 weeks.   Tylenol only until June 21, 2016.  Repeat colonoscopy recommended for surveillance.  Date to be determined after pathology results reviewed.  YOU HAD AN ENDOSCOPIC PROCEDURE TODAY AT [facility]:   Refer to the procedure report that was given to you for any specific questions about what was found during the examination.  If the procedure report does not answer your questions, please call your gastroenterologist to clarify.  If you requested that your care partner not be given the details of your procedure findings, then the procedure report has been included in a sealed envelope for you to review at your convenience later.  YOU SHOULD EXPECT: Some feelings of bloating in the abdomen. Passage of more gas than usual.  Walking can help get rid of the air that was put into your GI tract during the procedure and reduce the bloating. If you had a lower endoscopy (such as a colonoscopy or flexible sigmoidoscopy) you may notice spotting of blood in your stool or on the toilet paper. If you underwent a bowel prep for your procedure, you may not have a normal bowel movement for a few days.  Please Note:  You might notice some irritation and congestion in your nose or some drainage.  This is from the oxygen used during your procedure.  There is no need for concern and it should clear up in a day or so.  SYMPTOMS TO REPORT IMMEDIATELY:   Following lower endoscopy  (colonoscopy or flexible sigmoidoscopy):  Excessive amounts of blood in the stool  Significant tenderness or worsening of abdominal pains  Swelling of the abdomen that is new, acute  Fever of 100F or higher For urgent or emergent issues, a gastroenterologist can be reached at any hour by calling [phone].   DIET:  We do recommend a small meal at first, but then you may proceed to your regular diet.  Drink plenty of fluids but you should avoid alcoholic beverages for 24 hours.  ACTIVITY:  You should plan to take it easy for the rest of today and you should NOT DRIVE or use heavy machinery until tomorrow (because of the sedation medicines used during the test).    FOLLOW UP: Our staff will call the number listed on your records the next business day following your procedure to check on you and address any questions or concerns that you may have regarding the information given to you following your procedure. If we do not reach you, we will leave a message.  However, if you are feeling well and you are not experiencing any problems, there is no need to return our call.  We will assume that you have returned to your regular daily activities without incident.  If any biopsies were taken you will be contacted by phone or by letter within the next 1-3 weeks.  Please call us at [phone] if you have not heard about the biopsies in 3 weeks.  SIGNATURES/CONFIDENTIALITY: You and/or your care partner have signed paperwork which will be entered into your electronic medical record.  These signatures attest to the fact that that the information above on your After Visit Summary has been reviewed and is understood.  Full responsibility of the confidentiality of this discharge information lies with you and/or your care-partner.

## 2016-06-06 NOTE — Progress Notes (Signed)
Called to room to assist during endoscopic procedure.  Patient ID and intended procedure confirmed with present staff. Received instructions for my participation in the procedure from the performing physician.  

## 2016-06-06 NOTE — Op Note (Signed)
[facility] Patient Name: Colton Cummings Procedure Date: 06/06/2016 9:14 AM MRN: [id number] Endoscopist: Gatha Mayer , MD Age: 71 Referring MD:  Date of Birth: 1945-12-09 Gender: Male Account #: [id number] Procedure:                Colonoscopy Indications:              Surveillance: Personal history of adenomatous                            polyps on last colonoscopy > 3 years ago Medicines:                Propofol per Anesthesia, Monitored Anesthesia Care Procedure:                Pre-Anesthesia Assessment:                           - Prior to the procedure, a History and Physical                            was performed, and patient medications and                            allergies were reviewed. The patient's tolerance of                            previous anesthesia was also reviewed. The risks                            and benefits of the procedure and the sedation                            options and risks were discussed with the patient.                            All questions were answered, and informed consent                            was obtained. Prior Anticoagulants: The patient                            last took aspirin 1 day prior to the procedure. ASA                            Grade Assessment: II - A patient with mild systemic                            disease. After reviewing the risks and benefits,                            the patient was deemed in satisfactory condition to                            undergo the procedure.  After obtaining informed consent, the colonoscope                            was passed under direct vision. Throughout the                            procedure, the patient's blood pressure, pulse, and                            oxygen saturations were monitored continuously. The                            Model CF-HQ190L [id number] [id number]) scope was introduced                            through the  anus and advanced to the the cecum,                            identified by appendiceal orifice and ileocecal                            valve. The colonoscopy was performed without                            difficulty. The patient tolerated the procedure                            well. The quality of the bowel preparation was                            excellent. The bowel preparation used was Miralax.                            The ileocecal valve, appendiceal orifice, and                            rectum were photographed. Scope In: 9:19:49 AM Scope Out: 9:52:53 AM Scope Withdrawal Time: 0 hours 30 minutes 17 seconds  Total Procedure Duration: 0 hours 33 minutes 4 seconds  Findings:                 The perianal and digital rectal examinations were                            normal. Pertinent negatives include normal prostate                            (size, shape, and consistency).                           A 25 mm polyp was found in the cecum. The polyp was                            flat. The polyp was removed with a saline  injection-lift technique using a hot snare.                            Resection and retrieval were complete. Verification                            of patient identification for the specimen was                            done. Estimated blood loss: none. To prevent                            bleeding after mucosal resection, three hemostatic                            clips were successfully placed (MR conditional).                            There was no bleeding during, or at the end, of the                            procedure.                           A 10 mm polyp was found in the cecum. The polyp was                            sessile. The polyp was removed with a hot snare.                            Resection and retrieval were complete. Verification                            of patient identification for the specimen  was                            done. Estimated blood loss: none.                           Two sessile polyps were found in the transverse                            colon. The polyps were 3 to 7 mm in size. These                            polyps were removed with a cold snare. Resection                            and retrieval were complete. Estimated blood loss                            was minimal.  External and internal hemorrhoids were found during                            retroflexion.                           The exam was otherwise without abnormality on                            direct and retroflexion views. Complications:            No immediate complications. Estimated Blood Loss:     Estimated blood loss was minimal. Impression:               - One 25 mm polyp in the cecum, removed using                            injection-lift and a hot snare. Resected and                            retrieved. Clips (MR conditional) were placed.                           - One 10 mm polyp in the cecum, removed with a hot                            snare. Resected and retrieved.                           - Two 3 to 7 mm polyps in the transverse colon,                            removed with a cold snare. Resected and retrieved.                           - External and internal hemorrhoids.                           - The examination was otherwise normal on direct                            and retroflexion views.                           - Personal history of colonic polyps.                            cecal/ascending ssp 2014 Recommendation:           - Patient has a contact number available for                            emergencies. The signs and symptoms of potential                            delayed complications were discussed  with the                            patient. Return to normal activities tomorrow.                            Written discharge  instructions were provided to the                            patient.                           - Resume previous diet.                           - Continue present medications.                           - No aspirin, ibuprofen, naproxen, or other                            non-steroidal anti-inflammatory drugs for 2 weeks                            after polyp removal.                           - Repeat colonoscopy is recommended for                            surveillance. The colonoscopy date will be                            determined after pathology results from today's                            exam become available for review. Gatha Mayer, MD 06/06/2016 10:04:28 AM This report has been signed electronically.

## 2016-06-06 NOTE — Progress Notes (Signed)
Spontaneous respirations throughout. VSS. Resting comfortably. To PACU on room air. Report to  Jane RN. 

## 2016-06-07 ENCOUNTER — Telehealth: Payer: Self-pay | Admitting: *Deleted

## 2016-06-07 NOTE — Telephone Encounter (Deleted)
Spoke with pt. He is doing well.  He has no questions or concerns.

## 2016-06-07 NOTE — Telephone Encounter (Addendum)
  Follow up Call-  Call back number 06/06/2016  Post procedure Call Back phone  # [phone]  Permission to leave phone message Yes  Some recent data might be hidden     Patient questions:  Do you have a fever, pain , or abdominal swelling? No. Pain Score  0 *  Have you tolerated food without any problems? Yes.    Have you been able to return to your normal activities? Yes.    Do you have any questions about your discharge instructions: Diet   No. Medications  No. Follow up visit  No.  Do you have questions or concerns about your Care? No.  Actions: * If pain score is 4 or above: No action needed, pain <4.

## 2016-06-12 ENCOUNTER — Encounter (HOSPITAL_COMMUNITY)
Admission: EM | Disposition: A | Discharge status: Home / Self Care | Payer: Self-pay | Source: Home / Self Care | Attending: Internal Medicine

## 2016-06-12 ENCOUNTER — Encounter (HOSPITAL_COMMUNITY): Payer: Self-pay | Admitting: Emergency Medicine

## 2016-06-12 ENCOUNTER — Inpatient Hospital Stay (HOSPITAL_COMMUNITY)
Admission: EM | Admit: 2016-06-12 | Discharge: 2016-06-13 | DRG: 920 | Disposition: A | Discharge status: Home / Self Care | Payer: Medicare Other | Attending: Internal Medicine | Admitting: Internal Medicine

## 2016-06-12 DIAGNOSIS — K219 Gastro-esophageal reflux disease without esophagitis: Secondary | ICD-10-CM | POA: Diagnosis not present

## 2016-06-12 DIAGNOSIS — E785 Hyperlipidemia, unspecified: Secondary | ICD-10-CM | POA: Diagnosis present

## 2016-06-12 DIAGNOSIS — Z8719 Personal history of other diseases of the digestive system: Secondary | ICD-10-CM

## 2016-06-12 DIAGNOSIS — K921 Melena: Secondary | ICD-10-CM | POA: Diagnosis present

## 2016-06-12 DIAGNOSIS — Z86010 Personal history of colonic polyps: Secondary | ICD-10-CM

## 2016-06-12 DIAGNOSIS — Z7982 Long term (current) use of aspirin: Secondary | ICD-10-CM

## 2016-06-12 DIAGNOSIS — I10 Essential (primary) hypertension: Secondary | ICD-10-CM

## 2016-06-12 DIAGNOSIS — I959 Hypotension, unspecified: Secondary | ICD-10-CM | POA: Diagnosis not present

## 2016-06-12 DIAGNOSIS — K648 Other hemorrhoids: Secondary | ICD-10-CM | POA: Diagnosis present

## 2016-06-12 DIAGNOSIS — Z79899 Other long term (current) drug therapy: Secondary | ICD-10-CM

## 2016-06-12 DIAGNOSIS — K922 Gastrointestinal hemorrhage, unspecified: Secondary | ICD-10-CM | POA: Diagnosis present

## 2016-06-12 DIAGNOSIS — Z87891 Personal history of nicotine dependence: Secondary | ICD-10-CM | POA: Diagnosis not present

## 2016-06-12 DIAGNOSIS — Y838 Other surgical procedures as the cause of abnormal reaction of the patient, or of later complication, without mention of misadventure at the time of the procedure: Secondary | ICD-10-CM | POA: Diagnosis present

## 2016-06-12 DIAGNOSIS — K2971 Gastritis, unspecified, with bleeding: Secondary | ICD-10-CM

## 2016-06-12 DIAGNOSIS — D62 Acute posthemorrhagic anemia: Secondary | ICD-10-CM | POA: Diagnosis present

## 2016-06-12 DIAGNOSIS — K91840 Postprocedural hemorrhage and hematoma of a digestive system organ or structure following a digestive system procedure: Principal | ICD-10-CM | POA: Diagnosis present

## 2016-06-12 HISTORY — PX: COLONOSCOPY: SHX5424

## 2016-06-12 LAB — CBC
HCT: 30.4 % — ABNORMAL LOW (ref 39.0–52.0)
HEMATOCRIT: 30.7 % — AB (ref 39.0–52.0)
HEMATOCRIT: 26.1 % — AB (ref 39.0–52.0)
HEMOGLOBIN: 10.6 g/dL — AB (ref 13.0–17.0)
HEMOGLOBIN: 9 g/dL — AB (ref 13.0–17.0)
Hemoglobin: 10.3 g/dL — ABNORMAL LOW (ref 13.0–17.0)
MCH: 31.4 pg (ref 26.0–34.0)
MCH: 31.3 pg (ref 26.0–34.0)
MCH: 30.8 pg (ref 26.0–34.0)
MCHC: 34.5 g/dL (ref 30.0–36.0)
MCHC: 34.5 g/dL (ref 30.0–36.0)
MCHC: 33.9 g/dL (ref 30.0–36.0)
MCV: 90.8 fL (ref 78.0–100.0)
MCV: 90.6 fL (ref 78.0–100.0)
MCV: 91 fL (ref 78.0–100.0)
Platelets: 210 10*3/uL (ref 150–400)
Platelets: 180 10*3/uL (ref 150–400)
Platelets: 185 10*3/uL (ref 150–400)
RBC: 3.38 MIL/uL — ABNORMAL LOW (ref 4.22–5.81)
RBC: 2.88 MIL/uL — AB (ref 4.22–5.81)
RBC: 3.34 MIL/uL — ABNORMAL LOW (ref 4.22–5.81)
RDW: 12.8 % (ref 11.5–15.5)
RDW: 12.8 % (ref 11.5–15.5)
RDW: 13 % (ref 11.5–15.5)
WBC: 7.5 10*3/uL (ref 4.0–10.5)
WBC: 11.4 10*3/uL — AB (ref 4.0–10.5)
WBC: 8.7 10*3/uL (ref 4.0–10.5)

## 2016-06-12 LAB — MAGNESIUM: Magnesium: 1.9 mg/dL (ref 1.7–2.4)

## 2016-06-12 LAB — COMPREHENSIVE METABOLIC PANEL
ALBUMIN: 3.9 g/dL (ref 3.5–5.0)
ALK PHOS: 50 U/L (ref 38–126)
ALT: 19 U/L (ref 17–63)
ANION GAP: 7 (ref 5–15)
AST: 16 U/L (ref 15–41)
BUN: 30 mg/dL — ABNORMAL HIGH (ref 6–20)
CALCIUM: 9 mg/dL (ref 8.9–10.3)
CHLORIDE: 105 mmol/L (ref 101–111)
CO2: 27 mmol/L (ref 22–32)
Creatinine, Ser: 0.81 mg/dL (ref 0.61–1.24)
GFR calc Af Amer: >60 mL/min (ref >60)
GFR calc non Af Amer: >60 mL/min (ref >60)
GLUCOSE: 134 mg/dL — AB (ref 65–99)
Potassium: 3.9 mmol/L (ref 3.5–5.1)
SODIUM: 139 mmol/L (ref 135–145)
Total Bilirubin: 0.3 mg/dL (ref 0.3–1.2)
Total Protein: 6.6 g/dL (ref 6.5–8.1)

## 2016-06-12 LAB — CBG MONITORING, ED: GLUCOSE-CAPILLARY: 151 mg/dL — AB (ref 65–99)

## 2016-06-12 LAB — ABO/RH: ABO/RH(D): O POS

## 2016-06-12 SURGERY — COLONOSCOPY
Anesthesia: Moderate Sedation

## 2016-06-12 MED ORDER — SODIUM CHLORIDE 0.9 % IV SOLN
Freq: Once | INTRAVENOUS | Status: AC
Start: 2016-06-12 — End: 2016-06-12
  Administered 2016-06-12: 15:00:00 via INTRAVENOUS

## 2016-06-12 MED ORDER — PEG-KCL-NACL-NASULF-NA ASC-C 100 G PO SOLR: 1.0000 | Freq: Once | ORAL | Status: DC

## 2016-06-12 MED ORDER — SODIUM CHLORIDE 0.9 % IV SOLN
INTRAVENOUS | Status: DC
  Administered 2016-06-12: 11:00:00 via INTRAVENOUS
  Administered 2016-06-12: 500 mL via INTRAVENOUS
  Administered 2016-06-12: 19:00:00 via INTRAVENOUS

## 2016-06-12 MED ORDER — PANTOPRAZOLE SODIUM 40 MG PO TBEC
40.0000 mg | DELAYED_RELEASE_TABLET | Freq: Every day | ORAL | Status: DC
  Administered 2016-06-13: 40 mg via ORAL
  Filled 2016-06-12: qty 1

## 2016-06-12 MED ORDER — DEXTROSE-NACL 5-0.45 % IV SOLN: INTRAVENOUS | Status: DC

## 2016-06-12 MED ORDER — TAMSULOSIN HCL 0.4 MG PO CAPS
0.8000 mg | ORAL_CAPSULE | Freq: Every day | ORAL | Status: DC
  Administered 2016-06-12: 0.8 mg via ORAL
  Filled 2016-06-12: qty 2

## 2016-06-12 MED ORDER — PEG-KCL-NACL-NASULF-NA ASC-C 100 G PO SOLR
0.5000 | Freq: Once | ORAL | Status: AC
  Administered 2016-06-12: 100 g via ORAL
  Filled 2016-06-12: qty 1

## 2016-06-12 MED ORDER — ACETAMINOPHEN 325 MG PO TABS: 650.0000 mg | ORAL_TABLET | Freq: Four times a day (QID) | ORAL | Status: DC | PRN

## 2016-06-12 MED ORDER — SODIUM CHLORIDE 0.9 % IV BOLUS (SEPSIS): 500.0000 mL | Freq: Once | INTRAVENOUS | Status: DC

## 2016-06-12 MED ORDER — FINASTERIDE 5 MG PO TABS
5.0000 mg | ORAL_TABLET | Freq: Every day | ORAL | Status: DC
  Administered 2016-06-12: 5 mg via ORAL
  Filled 2016-06-12 (×2): qty 1

## 2016-06-12 MED ORDER — PEG-KCL-NACL-NASULF-NA ASC-C 100 G PO SOLR
0.5000 | Freq: Once | ORAL | Status: AC
  Administered 2016-06-12: 100 g via ORAL

## 2016-06-12 MED ORDER — FENTANYL CITRATE (PF) 100 MCG/2ML IJ SOLN
INTRAMUSCULAR | Status: DC | PRN
  Administered 2016-06-12 (×2): 25 ug via INTRAVENOUS

## 2016-06-12 MED ORDER — SIMVASTATIN 20 MG PO TABS
10.0000 mg | ORAL_TABLET | Freq: Every day | ORAL | Status: DC
  Administered 2016-06-12: 10 mg via ORAL
  Filled 2016-06-12 (×2): qty 1

## 2016-06-12 MED ORDER — SODIUM CHLORIDE 0.9 % IV BOLUS (SEPSIS)
1000.0000 mL | Freq: Once | INTRAVENOUS | Status: AC
  Administered 2016-06-12: 1000 mL via INTRAVENOUS

## 2016-06-12 MED ORDER — MIDAZOLAM HCL 5 MG/5ML IJ SOLN
INTRAMUSCULAR | Status: DC | PRN
  Administered 2016-06-12 (×2): 2 mg via INTRAVENOUS
  Administered 2016-06-12: 1 mg via INTRAVENOUS

## 2016-06-12 MED ORDER — ONDANSETRON HCL 4 MG PO TABS: 4.0000 mg | ORAL_TABLET | Freq: Four times a day (QID) | ORAL | Status: DC | PRN

## 2016-06-12 MED ORDER — FENTANYL CITRATE (PF) 100 MCG/2ML IJ SOLN
INTRAMUSCULAR | Status: AC
  Filled 2016-06-12: qty 2

## 2016-06-12 MED ORDER — MIDAZOLAM HCL 5 MG/ML IJ SOLN
INTRAMUSCULAR | Status: AC
  Filled 2016-06-12: qty 2

## 2016-06-12 MED ORDER — ACETAMINOPHEN 650 MG RE SUPP: 650.0000 mg | Freq: Four times a day (QID) | RECTAL | Status: DC | PRN

## 2016-06-12 MED ORDER — SODIUM CHLORIDE 0.9 % IV SOLN
Freq: Once | INTRAVENOUS | Status: AC
  Administered 2016-06-12: 10:00:00 via INTRAVENOUS

## 2016-06-12 MED ORDER — ONDANSETRON HCL 4 MG/2ML IJ SOLN: 4.0000 mg | Freq: Four times a day (QID) | INTRAMUSCULAR | Status: DC | PRN

## 2016-06-12 NOTE — H&P (Addendum)
[facility] History and Physical  Colton Cummings [id number] DOB: 1945-07-06 DOA: 06/12/2016  Referring physician:   PCP: Marylene Land, MD   Chief Complaint: *GI bleed   HPI:  71 year old male with a history of hypertension, gastroesophageal reflux disease, status post routine colonoscopy on 2/27, status post removal of multiple polyps, who presents to the emergency department today with chief complaint of gross melena, starting at 2 AM this morning. Stools contained a lot of maroon blood clots. He had 3 episodes prior to arrival to the ER. No epigastric pain, no chest pain or shortness of breath. Patient has not taken any aspirin or NSAIDs since his colonoscopy  ED course Blood pressure low on arrival in the 0000000 systolic. Pulse 80   Temp 97.4 F (36.3 C) (Oral)   Resp 14   Patient received multiple fluid boluses GI consulted-Amy Easterwood of [facility] . Patient admitted for evaluation of GI bleeding Hemoglobin drop from 12.8-10.6      Review of Systems: negative for the following  Constitutional: Denies fever, chills, diaphoresis, appetite change and fatigue.  HEENT: Denies photophobia, eye pain, redness, hearing loss, ear pain, congestion, sore throat, rhinorrhea, sneezing, mouth sores, trouble swallowing, neck pain, neck stiffness and tinnitus.  Respiratory: Denies SOB, DOE, cough, chest tightness, and wheezing.  Cardiovascular: Denies chest pain, palpitations and leg swelling.  Gastrointestinal: Positive for blood in stool Denies nausea, vomiting, abdominal pain, diarrhea, constipation, abdominal distention.  Genitourinary: Denies dysuria, urgency, frequency, hematuria, flank pain and difficulty urinating.  Musculoskeletal: Denies myalgias, back pain, joint swelling, arthralgias and gait problem.  Skin: Denies pallor, rash and wound.  Neurological: Denies dizziness, seizures, syncope, weakness, Positive for light-headedness, numbness and headaches.   Hematological: Denies adenopathy. Easy bruising, personal or family bleeding history  Psychiatric/Behavioral: Denies suicidal ideation, mood changes, confusion, nervousness, sleep disturbance and agitation       Past Medical History:  Diagnosis Date  . Anal polyp 2013  . Arthritis   . Dental crowns present   . GERD (gastroesophageal reflux disease)   . HTN (hypertension)   . Hyperlipidemia   . Wears glasses      Past Surgical History:  Procedure Laterality Date  . COLONOSCOPY    . HEMORRHOID SURGERY  01/11/2012   Internal hemorrhoid ligation  . LAPAROSCOPIC APPENDECTOMY N/A 06/16/2015   Procedure: APPENDECTOMY LAPAROSCOPIC;  Surgeon: Excell Seltzer, MD;  Location: [facility];  Service: General;  Laterality: N/A;  . RECTAL POLYPECTOMY  01/11/2012   Excision of anal canal mass.  . TONSILLECTOMY    . ULNAR NERVE TRANSPOSITION Left 03/31/2014   Procedure: DECOMPRESSION  LEFT ULNAR NERVE;  Surgeon: Daryll Brod, MD;  Location: [place];  Service: Orthopedics;  Laterality: Left;      Social History:  reports that he quit smoking about 16 years ago. His smoking use included Cigarettes. He has never used smokeless tobacco. He reports that he drinks about 0.5 - 1.0 oz of alcohol per week . He reports that he does not use drugs.    No Known Allergies  Family History  Problem Relation Age of Onset  . Colon cancer Neg Hx   . Esophageal cancer Neg Hx   . Rectal cancer Neg Hx   . Stomach cancer Neg Hx         Prior to Admission medications   Medication Sig Start Date End Date Taking? Authorizing Provider  acetaminophen (TYLENOL) 500 MG tablet Take 1,000 mg by mouth every 6 (six) hours as needed  for headache.   Yes Historical Provider, MD  aspirin 81 MG tablet Take 81 mg by mouth daily.   Yes Historical Provider, MD  aspirin-acetaminophen-caffeine (EXCEDRIN MIGRAINE) [phone] MG tablet Take 1 tablet by mouth every 6 (six) hours as needed for headache.   Yes  Historical Provider, MD  chlorthalidone (HYGROTON) 25 MG tablet Take 25 mg by mouth daily.  12/08/11  Yes Historical Provider, MD  cholecalciferol (VITAMIN D) 1000 units tablet Take 1,000 Units by mouth daily.   Yes Historical Provider, MD  finasteride (PROSCAR) 5 MG tablet Take 5 mg by mouth daily.  12/08/11  Yes Historical Provider, MD  ibuprofen (ADVIL,MOTRIN) 800 MG tablet Take 800 mg by mouth every 8 (eight) hours as needed for headache or moderate pain.   Yes Historical Provider, MD  lisinopril (PRINIVIL,ZESTRIL) 10 MG tablet Take 10 mg by mouth daily.  12/08/11  Yes Historical Provider, MD  loratadine (CLARITIN) 10 MG tablet Take 10 mg by mouth daily as needed for allergies.    Yes Historical Provider, MD  Multiple Vitamin (MULTIVITAMIN WITH MINERALS) TABS tablet Take 1 tablet by mouth daily.   Yes Historical Provider, MD  omeprazole (PRILOSEC) 20 MG capsule Take 20 mg by mouth daily.  12/08/11  Yes Historical Provider, MD  orlistat (ALLI) 60 MG capsule Take 60 mg by mouth 3 (three) times daily with meals.   Yes Historical Provider, MD  simvastatin (ZOCOR) 10 MG tablet Take 10 mg by mouth daily.  12/08/11  Yes Historical Provider, MD  SUMAtriptan (IMITREX) 100 MG tablet Take 100 mg by mouth daily as needed for migraine.  03/18/15  Yes Historical Provider, MD  tamsulosin (FLOMAX) 0.4 MG CAPS capsule Take 0.8 mg by mouth at bedtime.   Yes Historical Provider, MD     Physical Exam: Vitals:   06/12/16 0627 06/12/16 0803 06/12/16 0830 06/12/16 0901  BP: 138/77 (!) 100/48 (!) 94/53 (!) 112/54  Pulse: 80 70 67 73  Resp: 14 22 15 18   Temp: 97.4 F (36.3 C)     TempSrc: Oral     SpO2: 97% 98% 96% 97%  Weight: 106.6 kg (235 lb)     Height: 6\' 2"  (1.88 m)         Constitutional: NAD, calm, comfortable Vitals:   06/12/16 0627 06/12/16 0803 06/12/16 0830 06/12/16 0901  BP: 138/77 (!) 100/48 (!) 94/53 (!) 112/54  Pulse: 80 70 67 73  Resp: 14 22 15 18   Temp: 97.4 F (36.3 C)     TempSrc:  Oral     SpO2: 97% 98% 96% 97%  Weight: 106.6 kg (235 lb)     Height: 6\' 2"  (1.88 m)      Eyes: PERRL, lids and conjunctivae normal ENMT: Mucous membranes are moist. Posterior pharynx clear of any exudate or lesions.Normal dentition.  Neck: normal, supple, no masses, no thyromegaly Respiratory: clear to auscultation bilaterally, no wheezing, no crackles. Normal respiratory effort. No accessory muscle use.  Cardiovascular: Regular rate and rhythm, no murmurs / rubs / gallops. No extremity edema. 2+ pedal pulses. No carotid bruits.  Abdomen: no tenderness, no masses palpated. No hepatosplenomegaly. Bowel sounds positive.  Musculoskeletal: no clubbing / cyanosis. No joint deformity upper and lower extremities. Good ROM, no contractures. Normal muscle tone.  Skin: no rashes, lesions, ulcers. No induration Neurologic: CN 2-12 grossly intact. Sensation intact, DTR normal. Strength 5/5 in all 4.  Psychiatric: Normal judgment and insight. Alert and oriented x 3. Normal mood.     Labs  on Admission: I have personally reviewed following labs and imaging studies  CBC:  Recent Labs Lab 06/12/16 0629  WBC 7.5  HGB 10.6*  HCT 30.7*  MCV 90.8  PLT [id number]    Basic Metabolic Panel:  Recent Labs Lab 06/12/16 0629  NA 139  K 3.9  CL 105  CO2 27  GLUCOSE 134*  BUN 30*  CREATININE 0.81  CALCIUM 9.0    GFR: Estimated Creatinine Clearance: 110.4 mL/min (by C-G formula based on SCr of 0.81 mg/dL).  Liver Function Tests:  Recent Labs Lab 06/12/16 0629  AST 16  ALT 19  ALKPHOS 50  BILITOT 0.3  PROT 6.6  ALBUMIN 3.9   No results for input(s): LIPASE, AMYLASE in the last 168 hours. No results for input(s): AMMONIA in the last 168 hours.  Coagulation Profile: No results for input(s): INR, PROTIME in the last 168 hours. No results for input(s): DDIMER in the last 72 hours.  Cardiac Enzymes: No results for input(s): CKTOTAL, CKMB, CKMBINDEX, TROPONINI in the last 168 hours.  BNP  (last 3 results) No results for input(s): PROBNP in the last 8760 hours.  HbA1C: No results for input(s): HGBA1C in the last 72 hours. No results found for: HGBA1C   CBG:  Recent Labs Lab 06/12/16 0749  GLUCAP 151*    Lipid Profile: No results for input(s): CHOL, HDL, LDLCALC, TRIG, CHOLHDL, LDLDIRECT in the last 72 hours.  Thyroid Function Tests: No results for input(s): TSH, T4TOTAL, FREET4, T3FREE, THYROIDAB in the last 72 hours.  Anemia Panel: No results for input(s): VITAMINB12, FOLATE, FERRITIN, TIBC, IRON, RETICCTPCT in the last 72 hours.  Urine analysis:    Component Value Date/Time   COLORURINE YELLOW 06/16/2015 2113   APPEARANCEUR CLOUDY (A) 06/16/2015 2113   LABSPEC 1.017 06/16/2015 2113   PHURINE 5.5 06/16/2015 2113   GLUCOSEU NEGATIVE 06/16/2015 2113   HGBUR LARGE (A) 06/16/2015 2113   BILIRUBINUR NEGATIVE 06/16/2015 2113   KETONESUR 15 (A) 06/16/2015 2113   PROTEINUR NEGATIVE 06/16/2015 2113   NITRITE NEGATIVE 06/16/2015 2113   LEUKOCYTESUR TRACE (A) 06/16/2015 2113    Sepsis Labs: @LABRCNTIP (procalcitonin:4,lacticidven:4) )No results found for this or any previous visit (from the past 240 hour(s)).       Radiological Exams on Admission: No results found. Ct Pelvis Wo Contrast  Result Date: 05/26/2016 CLINICAL DATA:  Follow-up sclerotic lesions. EXAM: CT PELVIS WITHOUT CONTRAST TECHNIQUE: Multidetector CT imaging of the pelvis was performed following the standard protocol without intravenous contrast. COMPARISON:  Abdominal CT 06/16/2015 FINDINGS: Urinary Tract: Borderline bladder wall thickening when accounting for underdistention, likely related to prostate enlargement. Bowel:  Normal appendectomy.  No acute finding. Vascular/Lymphatic: Atherosclerotic calcification. No lymphadenopathy. Reproductive: Symmetric prostate enlargement up lifting the bladder base, chronic. Musculoskeletal: Small sclerotic foci in the bilateral iliac bone and left sacral  ala are stable and benign appearing. These are likely combination of bone islands and other benign fibro-osseous structures. There was chest CT early in 2017, when abdominal CT was obtained and these foci were highlighted. No suspicious sclerotic foci were noted in the thoracic skeleton at that time. IMPRESSION: Stable benign-appearing sclerotic foci in the pelvis. Electronically Signed   By: Monte Fantasia M.D.   On: 05/26/2016 09:38      EKG: Independently reviewed.   Assessment/Plan Principal Problem:   GI bleeding Status post polypectomy, on recent outpatient elective colonoscopy, which showed multiple adenomatous colonic polyps Similar presentation after last colonoscopy 4 years ago Serial CBC, transfuse if hemoglobin drops further  GI has been consulted We will keep npo pending further GI evaluation Doubt upper GI source but will continue PPI for now Hemoglobin drop from 10.6-9.0 after admission today. We'll transfuse 1 unit of packed red blood cells      Essential hypertension Hold all antihypertensive medications given low blood pressure on admission  Dyslipidemia-continue statin  DVT prophylaxis:  SCDs     Code Status Orders Full code        consults called:  Family Communication: Admission, patients condition and plan of care including tests being ordered have been discussed with the patient  who indicates understanding and agree with the plan and Code Status  Admission status: inpatient    Disposition plan: Further plan will depend as patient's clinical course evolves and further radiologic and laboratory data become available. Likely home when stable   At the time of admission, it appears that the appropriate admission status for this patient is INPATIENT .Thisis judged to be reasonable and necessary in order to provide the required intensity of service to ensure the patient's safetygiven thepresenting symptoms, physical exam findings, and initial  radiographic and laboratory data in the context of their chronic comorbidities.   Reyne Dumas MD [facility] Pager [phone]  If 7PM-7AM, please contact night-coverage [email or web address] Password [facility]  06/12/2016, 9:44 AM

## 2016-06-12 NOTE — Consult Note (Signed)
Consultation  Referring Provider: ER MD [facility] Primary Care Physician:  Marylene Land, MD Primary Gastroenterologist:  Dr Carlean Purl .  Reason for Consultation:  Gi Bleed  HPI: Colton Cummings is a 71 y.o. male known to Dr. Carlean Purl with history of adenomatous colon polyps, GERD, and hypertension. Patient presented to the emergency room early this morning after onset of grossly bloody in the middle of the night. He says he was awakened with an urge for bowel movement, had 1 loose stool which did not appear to be obviously bloody and then about 30 minutes later had another bowel movement which contained a lot of maroon blood and clots. He had 2 more episodes at home and then came on to the emergency room. He said he felt okay until he had a third bloody bowel movement in the emergency room and when walking back to his bed became lightheaded and dizzy. He has not had any associated pain, denies chest pain or shortness of breath.  Patient had undergone colonoscopy on 06/06/2016 per Dr. Carlean Purl at the [facility] with removal of a 25 mm cecal polyp which was flat and removed with saline injection and hot snare 3 hemoclips were placed to prevent bleeding. He also had a 10 mm polyp in the cecum, sessile removed with hot snare and 2 smaller polyps in the transverse colon one 3 mm and one 7 mm removed with cold snare. Patient said he had done well not had any evidence of bleeding until last night. He had been on a baby aspirin which is been held since his procedure, no blood thinners. Interestingly he also had a post polypectomy bleed after his last colonoscopy 4 years ago. At that time he had a 15 mm polyp in the descending colon. He was out of Tylenol and at the time of that bleed and was admitted to [facility] and underwent colonoscopy with Endo Clip ink of the ascending polypectomy site.  Patient has been hemodynamically stable since admission, mild hypotension after his last episode of  bloody stool which has responded to fluid bolus. Hemoglobin 10.6 on arrival, baseline 12.8.   Past Medical History:  Diagnosis Date  . Anal polyp 2013  . Arthritis   . Dental crowns present   . GERD (gastroesophageal reflux disease)   . HTN (hypertension)   . Hyperlipidemia   . Wears glasses     Past Surgical History:  Procedure Laterality Date  . COLONOSCOPY    . HEMORRHOID SURGERY  01/11/2012   Internal hemorrhoid ligation  . LAPAROSCOPIC APPENDECTOMY N/A 06/16/2015   Procedure: APPENDECTOMY LAPAROSCOPIC;  Surgeon: Excell Seltzer, MD;  Location: [facility];  Service: General;  Laterality: N/A;  . RECTAL POLYPECTOMY  01/11/2012   Excision of anal canal mass.  . TONSILLECTOMY    . ULNAR NERVE TRANSPOSITION Left 03/31/2014   Procedure: DECOMPRESSION  LEFT ULNAR NERVE;  Surgeon: Daryll Brod, MD;  Location: [place];  Service: Orthopedics;  Laterality: Left;    Prior to Admission medications   Medication Sig Start Date End Date Taking? Authorizing Provider  acetaminophen (TYLENOL) 500 MG tablet Take 1,000 mg by mouth every 6 (six) hours as needed for headache.   Yes Historical Provider, MD  aspirin 81 MG tablet Take 81 mg by mouth daily.   Yes Historical Provider, MD  aspirin-acetaminophen-caffeine (EXCEDRIN MIGRAINE) [phone] MG tablet Take 1 tablet by mouth every 6 (six) hours as needed for headache.   Yes Historical Provider, MD  chlorthalidone (  HYGROTON) 25 MG tablet Take 25 mg by mouth daily.  12/08/11  Yes Historical Provider, MD  cholecalciferol (VITAMIN D) 1000 units tablet Take 1,000 Units by mouth daily.   Yes Historical Provider, MD  finasteride (PROSCAR) 5 MG tablet Take 5 mg by mouth daily.  12/08/11  Yes Historical Provider, MD  ibuprofen (ADVIL,MOTRIN) 800 MG tablet Take 800 mg by mouth every 8 (eight) hours as needed for headache or moderate pain.   Yes Historical Provider, MD  lisinopril (PRINIVIL,ZESTRIL) 10 MG tablet Take 10 mg by mouth daily.  12/08/11   Yes Historical Provider, MD  loratadine (CLARITIN) 10 MG tablet Take 10 mg by mouth daily as needed for allergies.    Yes Historical Provider, MD  Multiple Vitamin (MULTIVITAMIN WITH MINERALS) TABS tablet Take 1 tablet by mouth daily.   Yes Historical Provider, MD  omeprazole (PRILOSEC) 20 MG capsule Take 20 mg by mouth daily.  12/08/11  Yes Historical Provider, MD  orlistat (ALLI) 60 MG capsule Take 60 mg by mouth 3 (three) times daily with meals.   Yes Historical Provider, MD  simvastatin (ZOCOR) 10 MG tablet Take 10 mg by mouth daily.  12/08/11  Yes Historical Provider, MD  SUMAtriptan (IMITREX) 100 MG tablet Take 100 mg by mouth daily as needed for migraine.  03/18/15  Yes Historical Provider, MD  tamsulosin (FLOMAX) 0.4 MG CAPS capsule Take 0.8 mg by mouth at bedtime.   Yes Historical Provider, MD    Current Facility-Administered Medications  Medication Dose Route Frequency Provider Last Rate Last Dose  . 0.9 %  sodium chloride infusion  500 mL Intravenous Continuous Gatha Mayer, MD      . dextrose 5 %-0.45 % sodium chloride infusion   Intravenous Continuous Tim Corriher S Staton Markey, PA-C      . peg 3350 powder (MOVIPREP) kit 200 g  1 kit Oral Once Berley Gambrell S Tamar Miano, PA-C      . sodium chloride 0.9 % bolus 1,000 mL  1,000 mL Intravenous Once Carlisle Cater, PA-C 1,000 mL/hr at 06/12/16 0912 1,000 mL at 06/12/16 4259   Current Outpatient Prescriptions  Medication Sig Dispense Refill  . acetaminophen (TYLENOL) 500 MG tablet Take 1,000 mg by mouth every 6 (six) hours as needed for headache.    Marland Kitchen aspirin 81 MG tablet Take 81 mg by mouth daily.    Marland Kitchen aspirin-acetaminophen-caffeine (EXCEDRIN MIGRAINE) 250-250-65 MG tablet Take 1 tablet by mouth every 6 (six) hours as needed for headache.    . chlorthalidone (HYGROTON) 25 MG tablet Take 25 mg by mouth daily.     . cholecalciferol (VITAMIN D) 1000 units tablet Take 1,000 Units by mouth daily.    . finasteride (PROSCAR) 5 MG tablet Take 5 mg by mouth daily.      Marland Kitchen ibuprofen (ADVIL,MOTRIN) 800 MG tablet Take 800 mg by mouth every 8 (eight) hours as needed for headache or moderate pain.    Marland Kitchen lisinopril (PRINIVIL,ZESTRIL) 10 MG tablet Take 10 mg by mouth daily.     Marland Kitchen loratadine (CLARITIN) 10 MG tablet Take 10 mg by mouth daily as needed for allergies.     . Multiple Vitamin (MULTIVITAMIN WITH MINERALS) TABS tablet Take 1 tablet by mouth daily.    Marland Kitchen omeprazole (PRILOSEC) 20 MG capsule Take 20 mg by mouth daily.     Marland Kitchen orlistat (ALLI) 60 MG capsule Take 60 mg by mouth 3 (three) times daily with meals.    . simvastatin (ZOCOR) 10 MG tablet Take 10 mg by  mouth daily.     . SUMAtriptan (IMITREX) 100 MG tablet Take 100 mg by mouth daily as needed for migraine.   0  . tamsulosin (FLOMAX) 0.4 MG CAPS capsule Take 0.8 mg by mouth at bedtime.      Allergies as of 06/12/2016  . (No Known Allergies)    Family History  Problem Relation Age of Onset  . Colon cancer Neg Hx   . Esophageal cancer Neg Hx   . Rectal cancer Neg Hx   . Stomach cancer Neg Hx     Social History   Social History  . Marital status: Married    Spouse name: N/A  . Number of children: N/A  . Years of education: N/A   Occupational History  . Not on file.   Social History Main Topics  . Smoking status: Former Smoker    Types: Cigarettes    Quit date: 01/02/2000  . Smokeless tobacco: Never Used  . Alcohol use 0.5 - 1.0 oz/week    1 - 2 Standard drinks or equivalent per week  . Drug use: No  . Sexual activity: Not on file   Other Topics Concern  . Not on file   Social History Narrative  . No narrative on file    Review of Systems: Pertinent positive and negative review of systems were noted in the above HPI section.  All other review of systems was otherwise negative.  Physical Exam: Vital signs in last 24 hours: Temp:  [97.4 F (36.3 C)] 97.4 F (36.3 C) (03/05 0627) Pulse Rate:  [67-80] 73 (03/05 0901) Resp:  [14-22] 18 (03/05 0901) BP: (94-138)/(48-77) 112/54  (03/05 0901) SpO2:  [96 %-98 %] 97 % (03/05 0901) Weight:  [235 lb (106.6 kg)] 235 lb (106.6 kg) (03/05 8280)   General:   Alert,  Well-developed, well-nourished,older WM pleasant and cooperative in NAD Head:  Normocephalic and atraumatic. Eyes:  Sclera clear, no icterus.   Conjunctiva pale. Ears:  Normal auditory acuity. Nose:  No deformity, discharge,  or lesions. Mouth:  No deformity or lesions.   Neck:  Supple; no masses or thyromegaly. Lungs:  Clear throughout to auscultation.   No wheezes, crackles, or rhonchi. Heart:  Regular rate and rhythm; no murmurs, clicks, rubs,  or gallops. Abdomen:  Soft,nontender, BS active,nonpalp mass or hsm.   Rectal:  Deferred - maroon stool in ER x one Msk:  Symmetrical without gross deformities. . Pulses:  Normal pulses noted. Extremities:  Without clubbing or edema. Neurologic:  Alert and  oriented x4;  grossly normal neurologically. Skin:  Intact without significant lesions or rashes.. Psych:  Alert and cooperative. Normal mood and affect.  Intake/Output from previous day: No intake/output data recorded. Intake/Output this shift: No intake/output data recorded.  Lab Results:  Recent Labs  06/12/16 0629  WBC 7.5  HGB 10.6*  HCT 30.7*  PLT 210   BMET  Recent Labs  06/12/16 0629  NA 139  K 3.9  CL 105  CO2 27  GLUCOSE 134*  BUN 30*  CREATININE 0.81  CALCIUM 9.0   LFT  Recent Labs  06/12/16 0629  PROT 6.6  ALBUMIN 3.9  AST 16  ALT 19  ALKPHOS 50  BILITOT 0.3   PT/INR No results for input(s): LABPROT, INR in the last 72 hours. Hepatitis Panel No results for input(s): HEPBSAG, HCVAB, HEPAIGM, HEPBIGM in the last 72 hours.        IMPRESSION:   #26 71 year old white male with acute lower GI  bleed secondary to post polypectomy site bleed /ulceration. Suspect bleeding secondary to large cecal polyp which was endoclipped prophylactically at the time of colonoscopy. #2 anemia secondary to acute blood loss #3  history of multiple adenomatous colon polyps #4 history of hypertension #5 history of hyperlipidemia  Plan; Patient will be admitted to the hospitalist service, and GI will follow closely in consultation Bedrest today Volume replacement Stat hemoglobin now and every 6 hours thereafter, would transfuse for hemoglobin less than 9 as he is symptomatic We will plan bowel prep starting at noon today, and plan for colonoscopy later in the afternoon per Dr. Havery Moros for hemostasis.      Malorie Bigford  06/12/2016, 9:15 AM

## 2016-06-12 NOTE — H&P (View-Only) (Signed)
Consultation  Referring Provider: ER MD [facility] Primary Care Physician:  Marylene Land, MD Primary Gastroenterologist:  Dr Carlean Purl .  Reason for Consultation:  Gi Bleed  HPI: Colton Cummings is a 71 y.o. male known to Dr. Carlean Purl with history of adenomatous colon polyps, GERD, and hypertension. Patient presented to the emergency room early this morning after onset of grossly bloody in the middle of the night. He says he was awakened with an urge for bowel movement, had 1 loose stool which did not appear to be obviously bloody and then about 30 minutes later had another bowel movement which contained a lot of maroon blood and clots. He had 2 more episodes at home and then came on to the emergency room. He said he felt okay until he had a third bloody bowel movement in the emergency room and when walking back to his bed became lightheaded and dizzy. He has not had any associated pain, denies chest pain or shortness of breath.  Patient had undergone colonoscopy on 06/06/2016 per Dr. Carlean Purl at the [facility] with removal of a 25 mm cecal polyp which was flat and removed with saline injection and hot snare 3 hemoclips were placed to prevent bleeding. He also had a 10 mm polyp in the cecum, sessile removed with hot snare and 2 smaller polyps in the transverse colon one 3 mm and one 7 mm removed with cold snare. Patient said he had done well not had any evidence of bleeding until last night. He had been on a baby aspirin which is been held since his procedure, no blood thinners. Interestingly he also had a post polypectomy bleed after his last colonoscopy 4 years ago. At that time he had a 15 mm polyp in the descending colon. He was out of Tylenol and at the time of that bleed and was admitted to [facility] and underwent colonoscopy with Endo Clip ink of the ascending polypectomy site.  Patient has been hemodynamically stable since admission, mild hypotension after his last episode of  bloody stool which has responded to fluid bolus. Hemoglobin 10.6 on arrival, baseline 12.8.   Past Medical History:  Diagnosis Date  . Anal polyp 2013  . Arthritis   . Dental crowns present   . GERD (gastroesophageal reflux disease)   . HTN (hypertension)   . Hyperlipidemia   . Wears glasses     Past Surgical History:  Procedure Laterality Date  . COLONOSCOPY    . HEMORRHOID SURGERY  01/11/2012   Internal hemorrhoid ligation  . LAPAROSCOPIC APPENDECTOMY N/A 06/16/2015   Procedure: APPENDECTOMY LAPAROSCOPIC;  Surgeon: Excell Seltzer, MD;  Location: [facility];  Service: General;  Laterality: N/A;  . RECTAL POLYPECTOMY  01/11/2012   Excision of anal canal mass.  . TONSILLECTOMY    . ULNAR NERVE TRANSPOSITION Left 03/31/2014   Procedure: DECOMPRESSION  LEFT ULNAR NERVE;  Surgeon: Daryll Brod, MD;  Location: [facility];  Service: Orthopedics;  Laterality: Left;    Prior to Admission medications   Medication Sig Start Date End Date Taking? Authorizing Provider  acetaminophen (TYLENOL) 500 MG tablet Take 1,000 mg by mouth every 6 (six) hours as needed for headache.   Yes Historical Provider, MD  aspirin 81 MG tablet Take 81 mg by mouth daily.   Yes Historical Provider, MD  aspirin-acetaminophen-caffeine (EXCEDRIN MIGRAINE) [phone] MG tablet Take 1 tablet by mouth every 6 (six) hours as needed for headache.   Yes Historical Provider, MD  chlorthalidone (  HYGROTON) 25 MG tablet Take 25 mg by mouth daily.  12/08/11  Yes Historical Provider, MD  cholecalciferol (VITAMIN D) 1000 units tablet Take 1,000 Units by mouth daily.   Yes Historical Provider, MD  finasteride (PROSCAR) 5 MG tablet Take 5 mg by mouth daily.  12/08/11  Yes Historical Provider, MD  ibuprofen (ADVIL,MOTRIN) 800 MG tablet Take 800 mg by mouth every 8 (eight) hours as needed for headache or moderate pain.   Yes Historical Provider, MD  lisinopril (PRINIVIL,ZESTRIL) 10 MG tablet Take 10 mg by mouth daily.  12/08/11   Yes Historical Provider, MD  loratadine (CLARITIN) 10 MG tablet Take 10 mg by mouth daily as needed for allergies.    Yes Historical Provider, MD  Multiple Vitamin (MULTIVITAMIN WITH MINERALS) TABS tablet Take 1 tablet by mouth daily.   Yes Historical Provider, MD  omeprazole (PRILOSEC) 20 MG capsule Take 20 mg by mouth daily.  12/08/11  Yes Historical Provider, MD  orlistat (ALLI) 60 MG capsule Take 60 mg by mouth 3 (three) times daily with meals.   Yes Historical Provider, MD  simvastatin (ZOCOR) 10 MG tablet Take 10 mg by mouth daily.  12/08/11  Yes Historical Provider, MD  SUMAtriptan (IMITREX) 100 MG tablet Take 100 mg by mouth daily as needed for migraine.  03/18/15  Yes Historical Provider, MD  tamsulosin (FLOMAX) 0.4 MG CAPS capsule Take 0.8 mg by mouth at bedtime.   Yes Historical Provider, MD    Current Facility-Administered Medications  Medication Dose Route Frequency Provider Last Rate Last Dose  . 0.9 %  sodium chloride infusion  500 mL Intravenous Continuous Gatha Mayer, MD      . dextrose 5 %-0.45 % sodium chloride infusion   Intravenous Continuous Elai Vanwyk S Janelys Glassner, PA-C      . peg 3350 powder (MOVIPREP) kit 200 g  1 kit Oral Once Palmina Clodfelter S Dermot Gremillion, PA-C      . sodium chloride 0.9 % bolus 1,000 mL  1,000 mL Intravenous Once Carlisle Cater, PA-C 1,000 mL/hr at 06/12/16 0912 1,000 mL at 06/12/16 2025   Current Outpatient Prescriptions  Medication Sig Dispense Refill  . acetaminophen (TYLENOL) 500 MG tablet Take 1,000 mg by mouth every 6 (six) hours as needed for headache.    Marland Kitchen aspirin 81 MG tablet Take 81 mg by mouth daily.    Marland Kitchen aspirin-acetaminophen-caffeine (EXCEDRIN MIGRAINE) 250-250-65 MG tablet Take 1 tablet by mouth every 6 (six) hours as needed for headache.    . chlorthalidone (HYGROTON) 25 MG tablet Take 25 mg by mouth daily.     . cholecalciferol (VITAMIN D) 1000 units tablet Take 1,000 Units by mouth daily.    . finasteride (PROSCAR) 5 MG tablet Take 5 mg by mouth daily.      Marland Kitchen ibuprofen (ADVIL,MOTRIN) 800 MG tablet Take 800 mg by mouth every 8 (eight) hours as needed for headache or moderate pain.    Marland Kitchen lisinopril (PRINIVIL,ZESTRIL) 10 MG tablet Take 10 mg by mouth daily.     Marland Kitchen loratadine (CLARITIN) 10 MG tablet Take 10 mg by mouth daily as needed for allergies.     . Multiple Vitamin (MULTIVITAMIN WITH MINERALS) TABS tablet Take 1 tablet by mouth daily.    Marland Kitchen omeprazole (PRILOSEC) 20 MG capsule Take 20 mg by mouth daily.     Marland Kitchen orlistat (ALLI) 60 MG capsule Take 60 mg by mouth 3 (three) times daily with meals.    . simvastatin (ZOCOR) 10 MG tablet Take 10 mg by  mouth daily.     . SUMAtriptan (IMITREX) 100 MG tablet Take 100 mg by mouth daily as needed for migraine.   0  . tamsulosin (FLOMAX) 0.4 MG CAPS capsule Take 0.8 mg by mouth at bedtime.      Allergies as of 06/12/2016  . (No Known Allergies)    Family History  Problem Relation Age of Onset  . Colon cancer Neg Hx   . Esophageal cancer Neg Hx   . Rectal cancer Neg Hx   . Stomach cancer Neg Hx     Social History   Social History  . Marital status: Married    Spouse name: N/A  . Number of children: N/A  . Years of education: N/A   Occupational History  . Not on file.   Social History Main Topics  . Smoking status: Former Smoker    Types: Cigarettes    Quit date: 01/02/2000  . Smokeless tobacco: Never Used  . Alcohol use 0.5 - 1.0 oz/week    1 - 2 Standard drinks or equivalent per week  . Drug use: No  . Sexual activity: Not on file   Other Topics Concern  . Not on file   Social History Narrative  . No narrative on file    Review of Systems: Pertinent positive and negative review of systems were noted in the above HPI section.  All other review of systems was otherwise negative.  Physical Exam: Vital signs in last 24 hours: Temp:  [97.4 F (36.3 C)] 97.4 F (36.3 C) (03/05 0627) Pulse Rate:  [67-80] 73 (03/05 0901) Resp:  [14-22] 18 (03/05 0901) BP: (94-138)/(48-77) 112/54  (03/05 0901) SpO2:  [96 %-98 %] 97 % (03/05 0901) Weight:  [235 lb (106.6 kg)] 235 lb (106.6 kg) (03/05 9562)   General:   Alert,  Well-developed, well-nourished,older WM pleasant and cooperative in NAD Head:  Normocephalic and atraumatic. Eyes:  Sclera clear, no icterus.   Conjunctiva pale. Ears:  Normal auditory acuity. Nose:  No deformity, discharge,  or lesions. Mouth:  No deformity or lesions.   Neck:  Supple; no masses or thyromegaly. Lungs:  Clear throughout to auscultation.   No wheezes, crackles, or rhonchi. Heart:  Regular rate and rhythm; no murmurs, clicks, rubs,  or gallops. Abdomen:  Soft,nontender, BS active,nonpalp mass or hsm.   Rectal:  Deferred - maroon stool in ER x one Msk:  Symmetrical without gross deformities. . Pulses:  Normal pulses noted. Extremities:  Without clubbing or edema. Neurologic:  Alert and  oriented x4;  grossly normal neurologically. Skin:  Intact without significant lesions or rashes.. Psych:  Alert and cooperative. Normal mood and affect.  Intake/Output from previous day: No intake/output data recorded. Intake/Output this shift: No intake/output data recorded.  Lab Results:  Recent Labs  06/12/16 0629  WBC 7.5  HGB 10.6*  HCT 30.7*  PLT 210   BMET  Recent Labs  06/12/16 0629  NA 139  K 3.9  CL 105  CO2 27  GLUCOSE 134*  BUN 30*  CREATININE 0.81  CALCIUM 9.0   LFT  Recent Labs  06/12/16 0629  PROT 6.6  ALBUMIN 3.9  AST 16  ALT 19  ALKPHOS 50  BILITOT 0.3   PT/INR No results for input(s): LABPROT, INR in the last 72 hours. Hepatitis Panel No results for input(s): HEPBSAG, HCVAB, HEPAIGM, HEPBIGM in the last 72 hours.        IMPRESSION:   #13 71 year old white male with acute lower GI  bleed secondary to post polypectomy site bleed /ulceration. Suspect bleeding secondary to large cecal polyp which was endoclipped prophylactically at the time of colonoscopy. #2 anemia secondary to acute blood loss #3  history of multiple adenomatous colon polyps #4 history of hypertension #5 history of hyperlipidemia  Plan; Patient will be admitted to the hospitalist service, and GI will follow closely in consultation Bedrest today Volume replacement Stat hemoglobin now and every 6 hours thereafter, would transfuse for hemoglobin less than 9 as he is symptomatic We will plan bowel prep starting at noon today, and plan for colonoscopy later in the afternoon per Dr. Havery Moros for hemostasis.      Claudius Mich  06/12/2016, 9:15 AM

## 2016-06-12 NOTE — ED Triage Notes (Signed)
Pt reports having had colonoscopy done on 06/06/16 and then last night began having bleeding from rectum. Pt states that he has been having dark colored stools. Also stated that with last colonoscopy the clips had released and bleeding occurred.

## 2016-06-12 NOTE — ED Provider Notes (Signed)
[facility] DEPT Provider Note   CSN: [id number] Arrival date & time: 06/12/16  0614     History   Chief Complaint Chief Complaint  Patient presents with  . Rectal Bleeding    HPI Colton Cummings is a 71 y.o. male.  Patient with history of aspirin use, previous GI bleed after colonoscopy -- presents with complaint of maroon stools and lightheadedness starting this morning at approximately 3 AM. Patient had a bowel movement 2 with bloody stools. No full syncope. No shortness of breath or chest pain. Patient states that his current symptoms are very similar to previous. He had a colonoscopy done by Dr. Carlean Purl on 06/06/16. Patient had several polyps removed. The onset of this condition was acute. The course is constant. Aggravating factors: none. Alleviating factors: none.        Past Medical History:  Diagnosis Date  . Anal polyp 2013  . Arthritis   . Dental crowns present   . GERD (gastroesophageal reflux disease)   . HTN (hypertension)   . Hyperlipidemia   . Wears glasses     Patient Active Problem List   Diagnosis Date Noted  . Acute appendicitis 06/16/2015  . Blood in stool 07/10/2012  . Personal history of colonic polyps 01/02/2012  . Polyp of anal canal, anterior midline 01/02/2012  . Hemorrhoids, internal 01/02/2012    Past Surgical History:  Procedure Laterality Date  . COLONOSCOPY    . HEMORRHOID SURGERY  01/11/2012   Internal hemorrhoid ligation  . LAPAROSCOPIC APPENDECTOMY N/A 06/16/2015   Procedure: APPENDECTOMY LAPAROSCOPIC;  Surgeon: Excell Seltzer, MD;  Location: [facility];  Service: General;  Laterality: N/A;  . RECTAL POLYPECTOMY  01/11/2012   Excision of anal canal mass.  . TONSILLECTOMY    . ULNAR NERVE TRANSPOSITION Left 03/31/2014   Procedure: DECOMPRESSION  LEFT ULNAR NERVE;  Surgeon: Daryll Brod, MD;  Location: [facility];  Service: Orthopedics;  Laterality: Left;       Home Medications    Prior to Admission medications    Medication Sig Start Date End Date Taking? Authorizing Provider  acetaminophen (TYLENOL) 500 MG tablet Take 1,000 mg by mouth every 6 (six) hours as needed for headache.   Yes Historical Provider, MD  aspirin 81 MG tablet Take 81 mg by mouth daily.   Yes Historical Provider, MD  aspirin-acetaminophen-caffeine (EXCEDRIN MIGRAINE) [phone] MG tablet Take 1 tablet by mouth every 6 (six) hours as needed for headache.   Yes Historical Provider, MD  chlorthalidone (HYGROTON) 25 MG tablet Take 25 mg by mouth daily.  12/08/11  Yes Historical Provider, MD  cholecalciferol (VITAMIN D) 1000 units tablet Take 1,000 Units by mouth daily.   Yes Historical Provider, MD  finasteride (PROSCAR) 5 MG tablet Take 5 mg by mouth daily.  12/08/11  Yes Historical Provider, MD  ibuprofen (ADVIL,MOTRIN) 800 MG tablet Take 800 mg by mouth every 8 (eight) hours as needed for headache or moderate pain.   Yes Historical Provider, MD  lisinopril (PRINIVIL,ZESTRIL) 10 MG tablet Take 10 mg by mouth daily.  12/08/11  Yes Historical Provider, MD  loratadine (CLARITIN) 10 MG tablet Take 10 mg by mouth daily as needed for allergies.    Yes Historical Provider, MD  Multiple Vitamin (MULTIVITAMIN WITH MINERALS) TABS tablet Take 1 tablet by mouth daily.   Yes Historical Provider, MD  omeprazole (PRILOSEC) 20 MG capsule Take 20 mg by mouth daily.  12/08/11  Yes Historical Provider, MD  orlistat (ALLI) 60 MG capsule Take  60 mg by mouth 3 (three) times daily with meals.   Yes Historical Provider, MD  simvastatin (ZOCOR) 10 MG tablet Take 10 mg by mouth daily.  12/08/11  Yes Historical Provider, MD  SUMAtriptan (IMITREX) 100 MG tablet Take 100 mg by mouth daily as needed for migraine.  03/18/15  Yes Historical Provider, MD  tamsulosin (FLOMAX) 0.4 MG CAPS capsule Take 0.8 mg by mouth at bedtime.   Yes Historical Provider, MD    Family History Family History  Problem Relation Age of Onset  . Colon cancer Neg Hx   . Esophageal cancer Neg Hx    . Rectal cancer Neg Hx   . Stomach cancer Neg Hx     Social History Social History  Substance Use Topics  . Smoking status: Former Smoker    Types: Cigarettes    Quit date: 01/02/2000  . Smokeless tobacco: Never Used  . Alcohol use 0.5 - 1.0 oz/week    1 - 2 Standard drinks or equivalent per week     Allergies   Patient has no known allergies.   Review of Systems Review of Systems  Constitutional: Negative for fever.  HENT: Negative for rhinorrhea and sore throat.   Eyes: Negative for redness.  Respiratory: Negative for cough.   Cardiovascular: Negative for chest pain.  Gastrointestinal: Positive for blood in stool. Negative for abdominal pain, diarrhea, nausea and vomiting.  Genitourinary: Negative for dysuria.  Musculoskeletal: Negative for myalgias.  Skin: Negative for rash.  Neurological: Positive for light-headedness. Negative for headaches.     Physical Exam Updated Vital Signs BP 138/77 (BP Location: Left Arm)   Pulse 80   Temp 97.4 F (36.3 C) (Oral)   Resp 14   Ht 6\' 2"  (1.88 m)   Wt 106.6 kg   SpO2 97%   BMI 30.17 kg/m   Physical Exam  Constitutional: He appears well-developed and well-nourished.  HENT:  Head: Normocephalic and atraumatic.  Eyes: Conjunctivae are normal. Right eye exhibits no discharge. Left eye exhibits no discharge.  Neck: Normal range of motion. Neck supple.  Cardiovascular: Normal rate, regular rhythm and normal heart sounds.   Pulmonary/Chest: Effort normal and breath sounds normal.  Abdominal: Soft. There is no tenderness.  Neurological: He is alert.  Skin: Skin is warm and dry. There is pallor.  Psychiatric: He has a normal mood and affect.  Nursing note and vitals reviewed.    ED Treatments / Results  Labs (all labs ordered are listed, but only abnormal results are displayed) Labs Reviewed  COMPREHENSIVE METABOLIC PANEL - Abnormal; Notable for the following:       Result Value   Glucose, Bld 134 (*)    BUN 30  (*)    All other components within normal limits  CBC - Abnormal; Notable for the following:    RBC 3.38 (*)    Hemoglobin 10.6 (*)    HCT 30.7 (*)    All other components within normal limits  CBG MONITORING, ED - Abnormal; Notable for the following:    Glucose-Capillary 151 (*)    All other components within normal limits  POC OCCULT BLOOD, ED  POC OCCULT BLOOD, ED  TYPE AND SCREEN  ABO/RH    EKG  EKG Interpretation None       Radiology No results found.  Procedures Procedures (including critical care time)  Medications Ordered in ED Medications  sodium chloride 0.9 % bolus 1,000 mL (not administered)     Initial Impression / Assessment and  Plan / ED Course  I have reviewed the triage vital signs and the nursing notes.  Pertinent labs & imaging results that were available during my care of the patient were reviewed by me and considered in my medical decision making (see chart for details).     Patient seen and examined. Work-up initiated. Medications ordered.   Vital signs reviewed and are as follows: BP 138/77 (BP Location: Left Arm)   Pulse 80   Temp 97.4 F (36.3 C) (Oral)   Resp 14   Ht 6\' 2"  (1.88 m)   Wt 106.6 kg   SpO2 97%   BMI 30.17 kg/m   8:51 AM BP lower, but color is better. Pt states he is feeling a little better. Fluids ordered. Pt had large BM here with maroon stools.   Spoke with Amy Easterwood of [facility] who will see.   Will admit to medicine.   8:59 AM Spoke with Dr. Allyson Sabal who will admit.   Pt updated and aware of plan.   Patient discussed with and seen by Dr. Venora Maples.   Final Clinical Impressions(s) / ED Diagnoses   Final diagnoses:  Hematochezia   Admit.   New Prescriptions New Prescriptions   No medications on file     Carlisle Cater, [place] 06/12/16 [place], MD 06/12/16 1155

## 2016-06-12 NOTE — Progress Notes (Signed)
Pt transported to Endo for colonoscopy. Endo RN aware of patient's blood transfusion in progress- will finish all blood documentation in Endo.

## 2016-06-12 NOTE — Op Note (Signed)
[facility] Patient Name: Colton Cummings Procedure Date: 06/12/2016 MRN: [id number] Attending MD: Carlota Raspberry. Armbruster MD, MD Date of Birth: [birth year] CSN: [id number] Age: 71 Admit Type: Inpatient Procedure:                Colonoscopy Indications:              Hematochezia, s/p recent colonoscopy with 2 cecal                            polypectomies Providers:                Remo Lipps P. Armbruster MD, MD, Burtis Junes, RN, Alfonso Patten, Technician Referring MD:              Medicines:                Fentanyl 50 micrograms IV, Midazolam 5 mg IV Complications:            No immediate complications. Estimated blood loss:                            None. Estimated Blood Loss:     Estimated blood loss: none. Procedure:                Pre-Anesthesia Assessment:                           - Prior to the procedure, a History and Physical                            was performed, and patient medications and                            allergies were reviewed. The patient's tolerance of                            previous anesthesia was also reviewed. The risks                            and benefits of the procedure and the sedation                            options and risks were discussed with the patient.                            All questions were answered, and informed consent                            was obtained. Prior Anticoagulants: The patient has                            taken no previous anticoagulant or antiplatelet                            agents.  ASA Grade Assessment: III - A patient with                            severe systemic disease. After reviewing the risks                            and benefits, the patient was deemed in                            satisfactory condition to undergo the procedure.                           After obtaining informed consent, the colonoscope                            was passed under direct  vision. Throughout the                            procedure, the patient's blood pressure, pulse, and                            oxygen saturations were monitored continuously. The                            [id number] scope was introduced through                            the anus and advanced to the the cecum, identified                            by appendiceal orifice and ileocecal valve. The                            colonoscopy was performed without difficulty. The                            patient tolerated the procedure well. The quality                            of the bowel preparation was adequate. The                            ileocecal valve, appendiceal orifice, and rectum                            were photographed. Scope In: 5:41:18 PM Scope Out: 6:13:25 PM Scope Withdrawal Time: 0 hours 26 minutes 23 seconds  Total Procedure Duration: 0 hours 32 minutes 7 seconds  Findings:      The perianal and digital rectal examinations were normal.      The two polypectomy sites were noted cecum, secondary to previous       polypectomy procedure. The largest had 3 clips attached - there were 2       spots of erythema, possible vessel, along the polypectomy site. This  site was behind a fold in an angulated position, and took some time to       get good positioning to provide endoscopic therapy. 3 additional       hemostasis clips were placed to this site to close the defect       completely. At the smaller polypectomy site in the cecum there was a       small red spot in the middle of the defect. I think this less likely to       be the cause of the patient's bleeding but one hemostasis clip was       placed to close the defect as a prophylactic measure. There was no       bleeding during, or at the end, of the procedure.      Internal hemorrhoids were found during retroflexion. The hemorrhoids       were moderate and inflamed.      The bowel prep was initially  only fair, time was taken to lavage the       colon. There was no residual blood in the colon after the bowel prep.       The exam was otherwise without abnormality. Impression:               - Polypectomy sites in the cecum noted. Clips were                            placed as above                           - Internal hemorrhoids.                           - The examination was otherwise normal. Moderate Sedation:      Moderate (conscious) sedation was administered by the endoscopy nurse       and supervised by the endoscopist. The following parameters were       monitored: oxygen saturation, heart rate, blood pressure, and response       to care. Total physician intraservice time was 41 minutes. Recommendation:           - Return patient to hospital ward for ongoing care.                           - Resume regular diet.                           - Continue present medications                           - Repeat CBC in the AM                           - NO NSAIDs for 2 weeks                           - Likely discharge in the morning if no evidence of                            recurrent bleeding Procedure Code(s):        ---  Professional ---                           [phone], Colonoscopy, flexible; with control of                            bleeding, any method                           99152, Moderate sedation services provided by the                            same physician or other qualified health care                            professional performing the diagnostic or                            therapeutic service that the sedation supports,                            requiring the presence of an independent trained                            observer to assist in the monitoring of the                            patient's level of consciousness and physiological                            status; initial 15 minutes of intraservice time,                            patient age 43  years or older                           [id number], Moderate sedation services; each additional                            15 minutes intraservice time                           99153, Moderate sedation services; each additional                            15 minutes intraservice time Diagnosis Code(s):        --- Professional ---                           [id number], Postprocedural hemorrhage of a digestive                            system organ or structure following a digestive                            system procedure  K64.8, Other hemorrhoids                           K92.1, Melena (includes Hematochezia) CPT copyright 2016 [employer] Association. All rights reserved. The codes documented in this report are preliminary and upon coder review may  be revised to meet current compliance requirements. Remo Lipps P. Armbruster MD, MD 06/12/2016 6:34:38 PM This report has been signed electronically. Number of Addenda: 0

## 2016-06-12 NOTE — Interval H&P Note (Signed)
History and Physical Interval Note:  06/12/2016 5:22 PM  Colton Cummings  has presented today for surgery, with the diagnosis of post polyp bleed  The various methods of treatment have been discussed with the patient and family. After consideration of risks, benefits and other options for treatment, the patient has consented to  Procedure(s): COLONOSCOPY (N/A) as a surgical intervention .  The patient's history has been reviewed, patient examined, no change in status, stable for surgery.  I have reviewed the patient's chart and labs.  Questions were answered to the patient's satisfaction.     Renelda Loma Colton Cummings

## 2016-06-12 NOTE — ED Notes (Signed)
Pt had a BM, bedpan filled with dark maroon blood and large blood clots. PA was notified.

## 2016-06-13 ENCOUNTER — Encounter (HOSPITAL_COMMUNITY): Payer: Self-pay | Admitting: Gastroenterology

## 2016-06-13 ENCOUNTER — Telehealth: Payer: Self-pay

## 2016-06-13 DIAGNOSIS — K922 Gastrointestinal hemorrhage, unspecified: Secondary | ICD-10-CM | POA: Diagnosis present

## 2016-06-13 DIAGNOSIS — Z8719 Personal history of other diseases of the digestive system: Secondary | ICD-10-CM

## 2016-06-13 DIAGNOSIS — I10 Essential (primary) hypertension: Secondary | ICD-10-CM | POA: Diagnosis not present

## 2016-06-13 DIAGNOSIS — K91840 Postprocedural hemorrhage and hematoma of a digestive system organ or structure following a digestive system procedure: Secondary | ICD-10-CM | POA: Diagnosis not present

## 2016-06-13 DIAGNOSIS — K2971 Gastritis, unspecified, with bleeding: Secondary | ICD-10-CM | POA: Diagnosis not present

## 2016-06-13 DIAGNOSIS — K921 Melena: Secondary | ICD-10-CM | POA: Diagnosis not present

## 2016-06-13 HISTORY — DX: Gastrointestinal hemorrhage, unspecified: K92.2

## 2016-06-13 HISTORY — DX: Personal history of other diseases of the digestive system: Z87.19

## 2016-06-13 LAB — COMPREHENSIVE METABOLIC PANEL
ALBUMIN: 3.4 g/dL — AB (ref 3.5–5.0)
ALK PHOS: 42 U/L (ref 38–126)
ALT: 16 U/L — ABNORMAL LOW (ref 17–63)
ANION GAP: 7 (ref 5–15)
AST: 14 U/L — ABNORMAL LOW (ref 15–41)
BILIRUBIN TOTAL: 0.7 mg/dL (ref 0.3–1.2)
BUN: 19 mg/dL (ref 6–20)
CALCIUM: 8.9 mg/dL (ref 8.9–10.3)
CO2: 27 mmol/L (ref 22–32)
Chloride: 106 mmol/L (ref 101–111)
Creatinine, Ser: 0.74 mg/dL (ref 0.61–1.24)
GFR calc Af Amer: >60 mL/min (ref >60)
GFR calc non Af Amer: >60 mL/min (ref >60)
GLUCOSE: 109 mg/dL — AB (ref 65–99)
Potassium: 4 mmol/L (ref 3.5–5.1)
Sodium: 140 mmol/L (ref 135–145)
TOTAL PROTEIN: 5.8 g/dL — AB (ref 6.5–8.1)

## 2016-06-13 LAB — CBC
HCT: 28.4 % — ABNORMAL LOW (ref 39.0–52.0)
HEMATOCRIT: 28.4 % — AB (ref 39.0–52.0)
HEMOGLOBIN: 9.6 g/dL — AB (ref 13.0–17.0)
HEMOGLOBIN: 9.5 g/dL — AB (ref 13.0–17.0)
MCH: 30.3 pg (ref 26.0–34.0)
MCH: 30 pg (ref 26.0–34.0)
MCHC: 33.8 g/dL (ref 30.0–36.0)
MCHC: 33.5 g/dL (ref 30.0–36.0)
MCV: 89.6 fL (ref 78.0–100.0)
MCV: 89.6 fL (ref 78.0–100.0)
PLATELETS: 184 10*3/uL (ref 150–400)
Platelets: 180 10*3/uL (ref 150–400)
RBC: 3.17 MIL/uL — ABNORMAL LOW (ref 4.22–5.81)
RBC: 3.17 MIL/uL — AB (ref 4.22–5.81)
RDW: 12.9 % (ref 11.5–15.5)
RDW: 12.9 % (ref 11.5–15.5)
WBC: 7.8 10*3/uL (ref 4.0–10.5)
WBC: 7.9 10*3/uL (ref 4.0–10.5)

## 2016-06-13 LAB — PROTIME-INR
INR: 1.1
Prothrombin Time: 14.3 seconds (ref 11.4–15.2)

## 2016-06-13 NOTE — Progress Notes (Signed)
Patient ID: Colton Cummings, male   DOB: [birth year], 71 y.o.   MRN: [id number]    Progress Note   Subjective   Feels good - no stool or blood since Colonoscopy Wants to go home HGB 9.6 post transfusions   Objective   Vital signs in last 24 hours: Temp:  [97.6 F (36.4 C)-98 F (36.7 C)] 97.6 F (36.4 C) (03/06 0453) Pulse Rate:  [64-88] 84 (03/06 0453) Resp:  [10-21] 18 (03/06 0453) BP: (81-128)/(34-74) 128/70 (03/06 0453) SpO2:  [97 %-100 %] 100 % (03/06 0453) Weight:  [230 lb (104.3 kg)] 230 lb (104.3 kg) (03/05 1113) Last BM Date: 06/12/16 General:    white male  in NAD Heart:  Regular rate and rhythm; no murmurs Lungs: Respirations even and unlabored, lungs CTA bilaterally Abdomen:  Soft, nontender and nondistended. Normal bowel sounds. Extremities:  Without edema. Neurologic:  Alert and oriented,  grossly normal neurologically. Psych:  Cooperative. Normal mood and affect.  Intake/Output from previous day: 03/05 0701 - 03/06 0700 In: 1587.5 [P.O.:480; I.V.:697.5; Blood:410] Out: [id number] [Urine:1850] Intake/Output this shift: No intake/output data recorded.  Lab Results:  Recent Labs  06/12/16 0935 06/12/16 2001 06/13/16 0555  WBC 11.4* 8.7 7.8  7.9  HGB 9.0* 10.3* 9.5*  9.6*  HCT 26.1* 30.4* 28.4*  28.4*  PLT 180 185 180  184   BMET  Recent Labs  06/12/16 0629 06/13/16 0555  NA 139 140  K 3.9 4.0  CL 105 106  CO2 27 27  GLUCOSE 134* 109*  BUN 30* 19  CREATININE 0.81 0.74  CALCIUM 9.0 8.9   LFT  Recent Labs  06/13/16 0555  PROT 5.8*  ALBUMIN 3.4*  AST 14*  ALT 16*  ALKPHOS 42  BILITOT 0.7   PT/INR  Recent Labs  06/13/16 0555  LABPROT 14.3  INR 1.10    Studies/Results: No results found.     Assessment / Plan:    #1 71 yo WM with acute post polypectomy bleed- stable post Colonoscopy and further endoclipping x 3 of large polypectomy site in cecum and one endoclip placed on smaller cecal polypectomy site as well. No active  bleeding at time of procedure Stable overnight - no further bleeding , pt feels fine  #2 anemia - secondary to acute blood loss- HGB down 5 grms from baseline- stable post transfusions   Plan; [place] for discharge home today  Gradually advance activity Pt will come to our office Monday 3/12 for labs No ASA , or NSAIDS x one month. He is aware he should come back to ER in event of recurrent bleeding  Principal Problem:   GI bleeding Active Problems:   Essential hypertension   Lower GI bleed     LOS: 1 day   Amy Esterwood  06/13/2016, 9:04 AM

## 2016-06-13 NOTE — Telephone Encounter (Signed)
-----   Message from Alfredia Ferguson, PA-C sent at 06/13/2016  9:24 AM EST ----- Regarding: post polyp bleed-labs  Colton Cummings.. FYI -pt admitted with post polypectomy bleed , transfused x 2 , see repeat colon report Poor guy had post polyp bleed 4 years ago also!  Colton Cummings - please place order for CBC on Monday - can put in my name - anemia secondary to acute blood loss.

## 2016-06-13 NOTE — Discharge Summary (Signed)
Physician Discharge Summary  Colton Cummings [id number] DOB: [birth year] DOA: 06/12/2016  PCP: Marylene Land, MD  Admit date: 06/12/2016 Discharge date: 06/13/2016  Admitted From: home Disposition:  Home  Recommendations for Outpatient Follow-up:  1. Follow up with Gastroenterology in 1 weeks Please obtain BMP/CBC in one week  Home Health:no Equipment/Devices:None  Discharge Condition:stable CODE STATUS:full Diet recommendation: Heart Healthy  Brief/Interim Summary: 71 year old male with a history of hypertension, gastroesophageal reflux disease, status post routine colonoscopy on 2/27, status post removal of multiple polyps, who presents to the emergency department today with chief complaint of gross melena, starting at 2 AM this morning  Discharge Diagnoses:  Principal Problem:   GI bleeding Active Problems:   Essential hypertension   Lower GI bleed  GI bleeding/Lower GI bleed: Status post colonoscopy on 06/12/2016 that showed multiple polyps and hemorrhoids. He will be discharged on no aspirin to follow-up with GI in one week. Check CBC at [facility] office.  Essential hypertension Resume home antihypertensive medications as an outpatient.   Discharge Instructions  Discharge Instructions    Diet - low sodium heart healthy    Complete by:  As directed    Increase activity slowly    Complete by:  As directed      Allergies as of 06/13/2016   No Known Allergies     Medication List    STOP taking these medications   aspirin 81 MG tablet   aspirin-acetaminophen-caffeine 250-250-65 MG tablet Commonly known as:  EXCEDRIN MIGRAINE     TAKE these medications   acetaminophen 500 MG tablet Commonly known as:  TYLENOL Take 1,000 mg by mouth every 6 (six) hours as needed for headache.   chlorthalidone 25 MG tablet Commonly known as:  HYGROTON Take 25 mg by mouth daily.   cholecalciferol 1000 units tablet Commonly known as:  VITAMIN D Take 1,000 Units by mouth  daily.   finasteride 5 MG tablet Commonly known as:  PROSCAR Take 5 mg by mouth daily.   ibuprofen 800 MG tablet Commonly known as:  ADVIL,MOTRIN Take 800 mg by mouth every 8 (eight) hours as needed for headache or moderate pain.   lisinopril 10 MG tablet Commonly known as:  PRINIVIL,ZESTRIL Take 10 mg by mouth daily.   loratadine 10 MG tablet Commonly known as:  CLARITIN Take 10 mg by mouth daily as needed for allergies.   multivitamin with minerals Tabs tablet Take 1 tablet by mouth daily.   omeprazole 20 MG capsule Commonly known as:  PRILOSEC Take 20 mg by mouth daily.   orlistat 60 MG capsule Commonly known as:  ALLI Take 60 mg by mouth 3 (three) times daily with meals.   simvastatin 10 MG tablet Commonly known as:  ZOCOR Take 10 mg by mouth daily.   SUMAtriptan 100 MG tablet Commonly known as:  IMITREX Take 100 mg by mouth daily as needed for migraine.   tamsulosin 0.4 MG Caps capsule Commonly known as:  FLOMAX Take 0.8 mg by mouth at bedtime.       No Known Allergies  Consultations:  Gastroenterology   Procedures/Studies: Ct Pelvis Wo Contrast  Result Date: 05/26/2016 CLINICAL DATA:  Follow-up sclerotic lesions. EXAM: CT PELVIS WITHOUT CONTRAST TECHNIQUE: Multidetector CT imaging of the pelvis was performed following the standard protocol without intravenous contrast. COMPARISON:  Abdominal CT 06/16/2015 FINDINGS: Urinary Tract: Borderline bladder wall thickening when accounting for underdistention, likely related to prostate enlargement. Bowel:  Normal appendectomy.  No acute finding. Vascular/Lymphatic: Atherosclerotic calcification. No lymphadenopathy.  Reproductive: Symmetric prostate enlargement up lifting the bladder base, chronic. Musculoskeletal: Small sclerotic foci in the bilateral iliac bone and left sacral ala are stable and benign appearing. These are likely combination of bone islands and other benign fibro-osseous structures. There was chest  CT early in 2017, when abdominal CT was obtained and these foci were highlighted. No suspicious sclerotic foci were noted in the thoracic skeleton at that time. IMPRESSION: Stable benign-appearing sclerotic foci in the pelvis. Electronically Signed   By: Monte Fantasia M.D.   On: 05/26/2016 09:38      Subjective: No complains  Discharge Exam: Vitals:   06/12/16 2045 06/13/16 0453  BP: 125/67 128/70  Pulse: 88 84  Resp: 18 18  Temp: 97.8 F (36.6 C) 97.6 F (36.4 C)   Vitals:   06/12/16 1825 06/12/16 1837 06/12/16 2045 06/13/16 0453  BP: 104/72 126/60 125/67 128/70  Pulse: 71 71 88 84  Resp: 12 18 18 18   Temp:  97.7 F (36.5 C) 97.8 F (36.6 C) 97.6 F (36.4 C)  TempSrc:  Oral Oral Oral  SpO2: 100% 100% 98% 100%  Weight:      Height:        General: Pt is alert, awake, not in acute distress Cardiovascular: RRR, S1/S2 +, no rubs, no gallops Respiratory: CTA bilaterally, no wheezing, no rhonchi Abdominal: Soft, NT, ND, bowel sounds + Extremities: no edema, no cyanosis    The results of significant diagnostics from this hospitalization (including imaging, microbiology, ancillary and laboratory) are listed below for reference.     Microbiology: No results found for this or any previous visit (from the past 240 hour(s)).   Labs: BNP (last 3 results) No results for input(s): BNP in the last 8760 hours. Basic Metabolic Panel:  Recent Labs Lab 06/12/16 0629 06/13/16 0555  NA 139 140  K 3.9 4.0  CL 105 106  CO2 27 27  GLUCOSE 134* 109*  BUN 30* 19  CREATININE 0.81 0.74  CALCIUM 9.0 8.9  MG 1.9  --    Liver Function Tests:  Recent Labs Lab 06/12/16 0629 06/13/16 0555  AST 16 14*  ALT 19 16*  ALKPHOS 50 42  BILITOT 0.3 0.7  PROT 6.6 5.8*  ALBUMIN 3.9 3.4*   No results for input(s): LIPASE, AMYLASE in the last 168 hours. No results for input(s): AMMONIA in the last 168 hours. CBC:  Recent Labs Lab 06/12/16 0629 06/12/16 0935 06/12/16 2001  06/13/16 0555  WBC 7.5 11.4* 8.7 7.8  7.9  HGB 10.6* 9.0* 10.3* 9.5*  9.6*  HCT 30.7* 26.1* 30.4* 28.4*  28.4*  MCV 90.8 90.6 91.0 89.6  89.6  PLT 210 180 185 180  184   Cardiac Enzymes: No results for input(s): CKTOTAL, CKMB, CKMBINDEX, TROPONINI in the last 168 hours. BNP: Invalid input(s): POCBNP CBG:  Recent Labs Lab 06/12/16 0749  GLUCAP 151*   D-Dimer No results for input(s): DDIMER in the last 72 hours. Hgb A1c No results for input(s): HGBA1C in the last 72 hours. Lipid Profile No results for input(s): CHOL, HDL, LDLCALC, TRIG, CHOLHDL, LDLDIRECT in the last 72 hours. Thyroid function studies No results for input(s): TSH, T4TOTAL, T3FREE, THYROIDAB in the last 72 hours.  Invalid input(s): FREET3 Anemia work up No results for input(s): VITAMINB12, FOLATE, FERRITIN, TIBC, IRON, RETICCTPCT in the last 72 hours. Urinalysis    Component Value Date/Time   COLORURINE YELLOW 06/16/2015 2113   APPEARANCEUR CLOUDY (A) 06/16/2015 2113   LABSPEC 1.017 06/16/2015 2113  PHURINE 5.5 06/16/2015 2113   GLUCOSEU NEGATIVE 06/16/2015 2113   HGBUR LARGE (A) 06/16/2015 2113   BILIRUBINUR NEGATIVE 06/16/2015 2113   KETONESUR 15 (A) 06/16/2015 2113   PROTEINUR NEGATIVE 06/16/2015 2113   NITRITE NEGATIVE 06/16/2015 2113   LEUKOCYTESUR TRACE (A) 06/16/2015 2113   Sepsis Labs Invalid input(s): PROCALCITONIN,  WBC,  LACTICIDVEN Microbiology No results found for this or any previous visit (from the past 240 hour(s)).   Time coordinating discharge: Over 30 minutes  SIGNED:   Charlynne Cousins, MD  [facility] 06/13/2016, 10:39 AM Pager   If 7PM-7AM, please contact night-coverage [email or web address] Password [email or web address]

## 2016-06-13 NOTE — Telephone Encounter (Signed)
Orders entered

## 2016-06-15 ENCOUNTER — Telehealth: Payer: Self-pay

## 2016-06-15 ENCOUNTER — Encounter: Payer: Self-pay | Admitting: Internal Medicine

## 2016-06-15 NOTE — Telephone Encounter (Signed)
-----   Message from Gatha Mayer, MD sent at 06/15/2016  1:17 PM EST ----- Regarding: ask for copy of labs Please ask Dr. Wyatt Mage office for a copy of the CBC he just had - s/p post-polypectomy bleed Patient told me he had one

## 2016-06-15 NOTE — Progress Notes (Signed)
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

## 2016-06-15 NOTE — Telephone Encounter (Signed)
  Spoke with [facility] office and as soon as Dr Sandi Mariscal sees the CBC (its on his desk) they will fax it over.

## 2016-06-15 NOTE — Telephone Encounter (Signed)
Spoke with Legrand [place] and informed him about the ferrous sulfate 325mg  daily dose that Dr Carlean Purl wants him to take.  He wants to let Dr Sandi Mariscal do the CBC/diff in a month to re-check his numbers.

## 2016-06-15 NOTE — Telephone Encounter (Signed)
Hgb is better than it was up to 10.6 with NL 13.2-17.1  I suggest he take ferrous sulfate 325 mg daily and have the blood count rechecked in a month He can ask Dr. Sandi Mariscal to do that and we can cc: this phone note to Dr. Sandi Mariscal    We can cancel the 3/12 CBC that Amy E set up

## 2016-06-16 LAB — TYPE AND SCREEN
ABO/RH(D): O POS
ANTIBODY SCREEN: NEGATIVE
Unit division: 0
Unit division: 0

## 2016-06-16 LAB — BPAM RBC
Blood Product Expiration Date: 201803282359
Blood Product Expiration Date: 201803282359
ISSUE DATE / TIME: 201803051540
Unit Type and Rh: 5100
Unit Type and Rh: 5100

## 2017-06-21 ENCOUNTER — Encounter: Payer: Self-pay | Admitting: Internal Medicine

## 2017-07-09 DIAGNOSIS — S2249XA Multiple fractures of ribs, unspecified side, initial encounter for closed fracture: Secondary | ICD-10-CM

## 2017-07-09 HISTORY — DX: Multiple fractures of ribs, unspecified side, initial encounter for closed fracture: S22.49XA

## 2017-07-28 ENCOUNTER — Other Ambulatory Visit: Payer: Self-pay

## 2017-07-28 ENCOUNTER — Emergency Department (HOSPITAL_COMMUNITY): Payer: Medicare Other

## 2017-07-28 ENCOUNTER — Inpatient Hospital Stay (HOSPITAL_COMMUNITY)
Admission: EM | Admit: 2017-07-28 | Discharge: 2017-07-30 | DRG: 185 | Disposition: A | Discharge status: Home / Self Care | Payer: Medicare Other | Attending: General Surgery | Admitting: General Surgery

## 2017-07-28 DIAGNOSIS — R0602 Shortness of breath: Secondary | ICD-10-CM | POA: Diagnosis not present

## 2017-07-28 DIAGNOSIS — S2249XA Multiple fractures of ribs, unspecified side, initial encounter for closed fracture: Secondary | ICD-10-CM | POA: Diagnosis present

## 2017-07-28 DIAGNOSIS — K219 Gastro-esophageal reflux disease without esophagitis: Secondary | ICD-10-CM | POA: Diagnosis present

## 2017-07-28 DIAGNOSIS — I10 Essential (primary) hypertension: Secondary | ICD-10-CM | POA: Diagnosis present

## 2017-07-28 DIAGNOSIS — Z87891 Personal history of nicotine dependence: Secondary | ICD-10-CM

## 2017-07-28 DIAGNOSIS — S2242XA Multiple fractures of ribs, left side, initial encounter for closed fracture: Principal | ICD-10-CM | POA: Diagnosis present

## 2017-07-28 DIAGNOSIS — E785 Hyperlipidemia, unspecified: Secondary | ICD-10-CM | POA: Diagnosis present

## 2017-07-28 DIAGNOSIS — E876 Hypokalemia: Secondary | ICD-10-CM | POA: Diagnosis present

## 2017-07-28 DIAGNOSIS — R0902 Hypoxemia: Secondary | ICD-10-CM

## 2017-07-28 DIAGNOSIS — Y990 Civilian activity done for income or pay: Secondary | ICD-10-CM

## 2017-07-28 DIAGNOSIS — Z79899 Other long term (current) drug therapy: Secondary | ICD-10-CM

## 2017-07-28 DIAGNOSIS — W01198A Fall on same level from slipping, tripping and stumbling with subsequent striking against other object, initial encounter: Secondary | ICD-10-CM | POA: Diagnosis present

## 2017-07-28 DIAGNOSIS — Z7982 Long term (current) use of aspirin: Secondary | ICD-10-CM

## 2017-07-28 DIAGNOSIS — M1990 Unspecified osteoarthritis, unspecified site: Secondary | ICD-10-CM | POA: Diagnosis present

## 2017-07-28 LAB — BASIC METABOLIC PANEL
ANION GAP: 12 (ref 5–15)
BUN: 23 mg/dL — ABNORMAL HIGH (ref 6–20)
CO2: 23 mmol/L (ref 22–32)
Calcium: 9.5 mg/dL (ref 8.9–10.3)
Chloride: 102 mmol/L (ref 101–111)
Creatinine, Ser: 0.85 mg/dL (ref 0.61–1.24)
GFR calc Af Amer: >60 mL/min (ref >60)
GLUCOSE: 141 mg/dL — AB (ref 65–99)
POTASSIUM: 3.7 mmol/L (ref 3.5–5.1)
Sodium: 137 mmol/L (ref 135–145)

## 2017-07-28 LAB — CBC
HEMATOCRIT: 41.6 % (ref 39.0–52.0)
HEMOGLOBIN: 14.4 g/dL (ref 13.0–17.0)
MCH: 31.5 pg (ref 26.0–34.0)
MCHC: 34.6 g/dL (ref 30.0–36.0)
MCV: 91 fL (ref 78.0–100.0)
Platelets: 187 10*3/uL (ref 150–400)
RBC: 4.57 MIL/uL (ref 4.22–5.81)
RDW: 12.7 % (ref 11.5–15.5)
WBC: 11.6 10*3/uL — ABNORMAL HIGH (ref 4.0–10.5)

## 2017-07-28 MED ORDER — ALBUTEROL SULFATE (2.5 MG/3ML) 0.083% IN NEBU
5.0000 mg | INHALATION_SOLUTION | Freq: Once | RESPIRATORY_TRACT | Status: AC
  Administered 2017-07-28: 5 mg via RESPIRATORY_TRACT
  Filled 2017-07-28: qty 6

## 2017-07-28 MED ORDER — IBUPROFEN 800 MG PO TABS
800.0000 mg | ORAL_TABLET | Freq: Once | ORAL | Status: AC
  Administered 2017-07-28: 800 mg via ORAL
  Filled 2017-07-28: qty 1

## 2017-07-28 MED ORDER — OXYCODONE-ACETAMINOPHEN 5-325 MG PO TABS
1.0000 | ORAL_TABLET | Freq: Once | ORAL | Status: AC
  Administered 2017-07-28: 1 via ORAL
  Filled 2017-07-28: qty 1

## 2017-07-28 MED ORDER — IOPAMIDOL (ISOVUE-300) INJECTION 61%
100.0000 mL | Freq: Once | INTRAVENOUS | Status: AC | PRN
  Administered 2017-07-28: 100 mL via INTRAVENOUS

## 2017-07-28 MED ORDER — IOPAMIDOL (ISOVUE-300) INJECTION 61%
INTRAVENOUS | Status: AC
  Filled 2017-07-28: qty 100

## 2017-07-28 NOTE — ED Triage Notes (Signed)
Pt brought in by [facility] from home for SOB that started yesterday after a mechanical fall at work. Pt was stepping over equipment, states did not step high enough and fell on his left side. SOB started yesterday but has gotten significantly worse today. Swelling noted to pt upper abdomen that pt states is no normal. Pt left ribcage tender on palpation. Per EMS lung sounds diminished on left side. Pt A+Ox4, in NAD.

## 2017-07-28 NOTE — ED Provider Notes (Signed)
[facility] EMERGENC[facility] DEPARTMENT Provider Note   CSN: [id number] Arrival date & time: 07/28/17  1956     History   Chief Complaint Chief Complaint  Patient presents with  . Shortness of Breath  . Fall    HPI Colton Cummings is a 72 y.o. male.  Colton Cummings is a 72 y.o. Male history of hypertension, hyperlipidemia, GERD and arthritis, who presents to the ED for evaluation of left rib pain and shortness of breath after a mechanical fall at work yesterday.  Patient reports he was stepping over equipment and tripped falling onto his left side, since then he has had a constant pain that is worse with deep breaths over the left flank.  Patient reports shortness of breath has been getting progressively worse today, he arrived with EMS requiring oxygen.  Patient denies any use of home oxygen.  He denies any centralized chest pain.  He denies any abdominal pain, nausea or vomiting.  No back or neck pain, when he fell he did not hit his head, no loss of consciousness patient denies headache, vision changes or dizziness.     Past Medical History:  Diagnosis Date  . Acute appendicitis 06/16/2015  . Acute lower GI bleeding after colon polypectomy 06/13/2016  . Anal polyp 2013  . Arthritis   . Dental crowns present   . GERD (gastroesophageal reflux disease)   . HTN (hypertension)   . Hyperlipidemia   . Wears glasses     Patient Active Problem List   Diagnosis Date Noted  . Essential hypertension 06/12/2016  . History of colonic polyps 01/02/2012  . Hemorrhoids, internal 01/02/2012    Past Surgical History:  Procedure Laterality Date  . COLONOSCOPY    . COLONOSCOPY N/A 06/12/2016   Procedure: COLONOSCOPY;  Surgeon: Manus Gunning, MD;  Location: [facility];  Service: Gastroenterology;  Laterality: N/A;  . HEMORRHOID SURGERY  01/11/2012   Internal hemorrhoid ligation  . LAPAROSCOPIC APPENDECTOMY N/A 06/16/2015   Procedure: APPENDECTOMY LAPAROSCOPIC;  Surgeon:  Excell Seltzer, MD;  Location: [facility];  Service: General;  Laterality: N/A;  . RECTAL POLYPECTOMY  01/11/2012   Excision of anal canal mass.  . TONSILLECTOMY    . ULNAR NERVE TRANSPOSITION Left 03/31/2014   Procedure: DECOMPRESSION  LEFT ULNAR NERVE;  Surgeon: Daryll Brod, MD;  Location: [facility];  Service: Orthopedics;  Laterality: Left;        Home Medications    Prior to Admission medications   Medication Sig Start Date End Date Taking? Authorizing Provider  acetaminophen (TYLENOL) 500 MG tablet Take 1,000 mg by mouth every 6 (six) hours as needed for headache.    [provider]  chlorthalidone (HYGROTON) 25 MG tablet Take 25 mg by mouth daily.  12/08/11   [provider]  cholecalciferol (VITAMIN D) 1000 units tablet Take 1,000 Units by mouth daily.    [provider]  finasteride (PROSCAR) 5 MG tablet Take 5 mg by mouth daily.  12/08/11   [provider]  ibuprofen (ADVIL,MOTRIN) 800 MG tablet Take 800 mg by mouth every 8 (eight) hours as needed for headache or moderate pain.    [provider]  lisinopril (PRINIVIL,ZESTRIL) 10 MG tablet Take 10 mg by mouth daily.  12/08/11   [provider]  loratadine (CLARITIN) 10 MG tablet Take 10 mg by mouth daily as needed for allergies.     [provider]  Multiple Vitamin (MULTIVITAMIN WITH MINERALS) TABS tablet Take 1  tablet by mouth daily.    [provider]  omeprazole (PRILOSEC) 20 MG capsule Take 20 mg by mouth daily.  12/08/11   [provider]  orlistat (ALLI) 60 MG capsule Take 60 mg by mouth 3 (three) times daily with meals.    [provider]  simvastatin (ZOCOR) 10 MG tablet Take 10 mg by mouth daily.  12/08/11   [provider]  SUMAtriptan (IMITREX) 100 MG tablet Take 100 mg by mouth daily as needed for migraine.  03/18/15   [provider]  tamsulosin (FLOMAX) 0.4 MG CAPS capsule Take 0.8 mg by mouth at  bedtime.    [provider]    Family History Family History  Problem Relation Age of Onset  . Colon cancer Neg Hx   . Esophageal cancer Neg Hx   . Rectal cancer Neg Hx   . Stomach cancer Neg Hx     Social History Social History   Tobacco Use  . Smoking status: Former Smoker    Types: Cigarettes    Last attempt to quit: 01/02/2000    Years since quitting: 17.5  . Smokeless tobacco: Never Used  Substance Use Topics  . Alcohol use: Yes    Alcohol/week: 0.5 - 1.0 oz    Types: 1 - 2 Standard drinks or equivalent per week  . Drug use: No     Allergies   Patient has no known allergies.   Review of Systems Review of Systems  Constitutional: Negative for chills and fever.  HENT: Negative for congestion, rhinorrhea and sore throat.   Eyes: Negative for photophobia and visual disturbance.  Respiratory: Positive for shortness of breath. Negative for cough, chest tightness and wheezing.   Cardiovascular: Positive for chest pain. Negative for palpitations and leg swelling.  Gastrointestinal: Negative for abdominal pain, diarrhea, nausea and vomiting.  Genitourinary: Negative for dysuria and frequency.  Musculoskeletal: Negative for arthralgias and myalgias.  Skin: Negative for color change and rash.  Neurological: Negative for dizziness, syncope and light-headedness.     Physical Exam Updated Vital Signs BP (!) 158/96   Pulse (!) 102   Temp (!) 97.3 F (36.3 C) (Axillary)   Resp (!) 24   Ht 6\' 2"  (1.88 m)   Wt 117.9 kg (260 lb)   SpO2 98%   BMI 33.38 kg/m   Physical Exam  Constitutional: He appears well-developed and well-nourished.  Non-toxic appearance. He does not appear ill. No distress.  HENT:  Head: Normocephalic and atraumatic.  Eyes: Right eye exhibits no discharge. Left eye exhibits no discharge.  Neck: Neck supple.  Cardiovascular: Normal rate, regular rhythm, normal heart sounds and intact distal pulses.  Pulmonary/Chest: No respiratory  distress. He has rales. He exhibits tenderness.  Patient is taking short shallow breaths due to pain, able to speak in full sentences without difficulty, on 5 L of oxygen initially, remained stable on 2 L.  Crackles in bilateral lung bases but otherwise lungs clear to auscultation with good breath sounds bilaterally. Chest tender to palpation over the left lower lateral and posterior ribs  Abdominal: Soft. Bowel sounds are normal. He exhibits no distension and no mass. There is tenderness. There is no guarding.  There is some mild left upper quadrant abdominal pain surrounding the most posterior ribs, no ecchymosis, no guarding, all other quadrants nontender to palpation  Musculoskeletal: He exhibits no edema or deformity.  No midline T or L-Spine tenderness All joints is supple and easily movable, all compartments soft  Neurological:  He is alert. Coordination normal.  Skin: Skin is warm and dry. Capillary refill takes less than 2 seconds. He is not diaphoretic.  Psychiatric: He has a normal mood and affect. His behavior is normal.  Nursing note and vitals reviewed.    ED Treatments / Results  Labs (all labs ordered are listed, but only abnormal results are displayed) Labs Reviewed  BASIC METABOLIC PANEL - Abnormal; Notable for the following components:      Result Value   Glucose, Bld 141 (*)    BUN 23 (*)    All other components within normal limits  CBC - Abnormal; Notable for the following components:   WBC 11.6 (*)    All other components within normal limits    EKG EKG Interpretation  Date/Time:  Saturday July 28 2017 19:59:34 EDT Ventricular Rate:  94 PR Interval:    QRS Duration: 98 QT Interval:  344 QTC Calculation: 431 R Axis:   54 Text Interpretation:  Sinus rhythm Normal sinus rhythm Confirmed by Thomasene Lot, [place] [phone]) on 07/29/2017 12:16:26 AM   Radiology Dg Chest 2 View  Result Date: 07/28/2017 CLINICAL DATA:  Shortness of breath EXAM: CHEST - 2 VIEW  COMPARISON:  06/16/2015 FINDINGS: Hyperinflation. No pleural effusion. Minimal atelectasis at the bases. Borderline to mild cardiomegaly with aortic atherosclerosis. No pneumothorax. Mild degenerative changes of the spine. IMPRESSION: No active cardiopulmonary disease. Mild subsegmental atelectasis at the bases. Electronically Signed   By: Donavan Foil M.D.   On: 07/28/2017 21:14   Ct Chest W Contrast  Result Date: 07/28/2017 CLINICAL DATA:  Acute onset of shortness of breath after fall on left side at work. Upper abdominal swelling. Left rib tenderness. EXAM: CT CHEST, ABDOMEN, AND PELVIS WITH CONTRAST TECHNIQUE: Multidetector CT imaging of the chest, abdomen and pelvis was performed following the standard protocol during bolus administration of intravenous contrast. CONTRAST:  145mL ISOVUE-300 IOPAMIDOL (ISOVUE-300) INJECTION 61% COMPARISON:  CT of the chest performed 04/16/2015, and CT of the abdomen and pelvis from 06/16/2015 FINDINGS: CT CHEST FINDINGS Cardiovascular: The heart is normal in size. The thoracic aorta is unremarkable, aside from scattered calcification. Mild calcification is noted along the proximal great vessels. Mediastinum/Nodes: The mediastinum is otherwise unremarkable. No mediastinal lymphadenopathy is seen. No pericardial effusion is identified. The visualized portions of the thyroid gland are unremarkable. No axillary lymphadenopathy is seen. Lungs/Pleura: Minimal bilateral atelectasis is noted. No pleural effusion or pneumothorax is seen. No masses are identified. Musculoskeletal: There are minimally displaced fractures of the left lateral fifth through eighth ribs. The visualized musculature is unremarkable in appearance. CT ABDOMEN PELVIS FINDINGS Hepatobiliary: The liver is unremarkable in appearance. The gallbladder is unremarkable in appearance. The common bile duct remains normal in caliber. Pancreas: The pancreas is within normal limits. Spleen: The spleen is unremarkable in  appearance. Adrenals/Urinary Tract: The adrenal glands are unremarkable in appearance. A right renal cyst is noted. Nonspecific perinephric stranding is noted bilaterally. There is no evidence of hydronephrosis. No renal or ureteral stones are identified. Stomach/Bowel: The stomach is unremarkable in appearance. The small bowel is within normal limits. The patient is status post appendectomy. The colon is unremarkable in appearance. Vascular/Lymphatic: Scattered calcification is seen along the abdominal aorta and its branches. The abdominal aorta is otherwise grossly unremarkable. The inferior vena cava is grossly unremarkable. No retroperitoneal lymphadenopathy is seen. No pelvic sidewall lymphadenopathy is identified. Reproductive: The bladder is mildly distended and grossly unremarkable. The prostate is enlarged, measuring 6.1 cm in transverse dimension, with impression  on the base of the bladder. Other: No additional soft tissue abnormalities are seen. Musculoskeletal: No acute osseous abnormalities are identified. The visualized musculature is unremarkable in appearance. IMPRESSION: 1. Minimally displaced fractures of the left lateral fifth through eighth ribs. 2. Minimal bilateral atelectasis.  Lungs otherwise clear. 3. Right renal cyst noted. 4. Significantly enlarged prostate, with impression on the base of the bladder. Would correlate with PSA. Electronically Signed   By: Garald Balding M.D.   On: 07/28/2017 23:39   Ct Abdomen Pelvis W Contrast  Result Date: 07/28/2017 CLINICAL DATA:  Acute onset of shortness of breath after fall on left side at work. Upper abdominal swelling. Left rib tenderness. EXAM: CT CHEST, ABDOMEN, AND PELVIS WITH CONTRAST TECHNIQUE: Multidetector CT imaging of the chest, abdomen and pelvis was performed following the standard protocol during bolus administration of intravenous contrast. CONTRAST:  171mL ISOVUE-300 IOPAMIDOL (ISOVUE-300) INJECTION 61% COMPARISON:  CT of the chest  performed 04/16/2015, and CT of the abdomen and pelvis from 06/16/2015 FINDINGS: CT CHEST FINDINGS Cardiovascular: The heart is normal in size. The thoracic aorta is unremarkable, aside from scattered calcification. Mild calcification is noted along the proximal great vessels. Mediastinum/Nodes: The mediastinum is otherwise unremarkable. No mediastinal lymphadenopathy is seen. No pericardial effusion is identified. The visualized portions of the thyroid gland are unremarkable. No axillary lymphadenopathy is seen. Lungs/Pleura: Minimal bilateral atelectasis is noted. No pleural effusion or pneumothorax is seen. No masses are identified. Musculoskeletal: There are minimally displaced fractures of the left lateral fifth through eighth ribs. The visualized musculature is unremarkable in appearance. CT ABDOMEN PELVIS FINDINGS Hepatobiliary: The liver is unremarkable in appearance. The gallbladder is unremarkable in appearance. The common bile duct remains normal in caliber. Pancreas: The pancreas is within normal limits. Spleen: The spleen is unremarkable in appearance. Adrenals/Urinary Tract: The adrenal glands are unremarkable in appearance. A right renal cyst is noted. Nonspecific perinephric stranding is noted bilaterally. There is no evidence of hydronephrosis. No renal or ureteral stones are identified. Stomach/Bowel: The stomach is unremarkable in appearance. The small bowel is within normal limits. The patient is status post appendectomy. The colon is unremarkable in appearance. Vascular/Lymphatic: Scattered calcification is seen along the abdominal aorta and its branches. The abdominal aorta is otherwise grossly unremarkable. The inferior vena cava is grossly unremarkable. No retroperitoneal lymphadenopathy is seen. No pelvic sidewall lymphadenopathy is identified. Reproductive: The bladder is mildly distended and grossly unremarkable. The prostate is enlarged, measuring 6.1 cm in transverse dimension, with  impression on the base of the bladder. Other: No additional soft tissue abnormalities are seen. Musculoskeletal: No acute osseous abnormalities are identified. The visualized musculature is unremarkable in appearance. IMPRESSION: 1. Minimally displaced fractures of the left lateral fifth through eighth ribs. 2. Minimal bilateral atelectasis.  Lungs otherwise clear. 3. Right renal cyst noted. 4. Significantly enlarged prostate, with impression on the base of the bladder. Would correlate with PSA. Electronically Signed   By: Garald Balding M.D.   On: 07/28/2017 23:39    Procedures Procedures (including critical care time)  Medications Ordered in ED Medications  iopamidol (ISOVUE-300) 61 % injection (has no administration in time range)  albuterol (PROVENTIL) (2.5 MG/3ML) 0.083% nebulizer solution 5 mg (5 mg Nebulization Given 07/28/17 2013)  ibuprofen (ADVIL,MOTRIN) tablet 800 mg (800 mg Oral Given 07/28/17 2244)  oxyCODONE-acetaminophen (PERCOCET/ROXICET) 5-325 MG per tablet 1 tablet (1 tablet Oral Given 07/28/17 2244)  iopamidol (ISOVUE-300) 61 % injection 100 mL (100 mLs Intravenous Contrast Given 07/28/17 2303)  Initial Impression / Assessment and Plan / ED Course  I have reviewed the triage vital signs and the nursing notes.  Pertinent labs & imaging results that were available during my care of the patient were reviewed by me and considered in my medical decision making (see chart for details).  Patient presents to the ED for evaluation of pain over the left ribs, and shortness of breath with new oxygen requirement after a fall yesterday.  On arrival patient requiring 5 L of oxygen with EMS, intermittently mildly tachypneic, and mildly hypertensive vitals otherwise normal.  Focal tenderness over the left lateral and posterior ribs, no overlying ecchymosis, no palpable deformity or crepitus.  Will get chest x-ray, EKG and basic labs.   Chest x-ray does not show any obvious rib fractures,  there is mild bibasilar atelectasis.  No acute electrolyte derangements and normal renal function, mild leukocytosis of 11.6, normal hemoglobin.  Given that patient is still having significant pain and requiring oxygen I am still highly concerned for rib fractures, will get CT of the chest, patient does have some mild tenderness on the left upper quadrant just under the rib cage will get CT of the abdomen and pelvis as well to rule out any complicating splenic lac.  Chest CT shows minimally displaced fractures of the left lateral fifth through eighth ribs, there is again minimal bilateral atelectasis, lungs are otherwise clear.  Incidental findings include a right renal cyst, and prostatic enlargement.  Given patient's age and greater than 3 rib fractures he has significant risk for mortality, patient continues to require 2 L nasal cannula.  Will consult trauma service for admission.  11:57 PM spoke with Dr. Ninfa Linden with trauma surgery who will see and admit the patient.  Patient discussed with Dr. Thomasene Lot, who saw patient as well and agrees with plan.   Final Clinical Impressions(s) / ED Diagnoses   Final diagnoses:  Multiple rib fractures involving four or more ribs  Shortness of breath  Hypoxia    ED Discharge Orders    None       Jacqlyn Larsen, [place] 07/29/17 0049    Macarthur Critchley, MD 08/01/17 [phone]

## 2017-07-29 ENCOUNTER — Encounter (HOSPITAL_COMMUNITY): Payer: Self-pay

## 2017-07-29 DIAGNOSIS — W01198A Fall on same level from slipping, tripping and stumbling with subsequent striking against other object, initial encounter: Secondary | ICD-10-CM | POA: Diagnosis present

## 2017-07-29 DIAGNOSIS — Z87891 Personal history of nicotine dependence: Secondary | ICD-10-CM | POA: Diagnosis not present

## 2017-07-29 DIAGNOSIS — E785 Hyperlipidemia, unspecified: Secondary | ICD-10-CM

## 2017-07-29 DIAGNOSIS — R0902 Hypoxemia: Secondary | ICD-10-CM | POA: Diagnosis present

## 2017-07-29 DIAGNOSIS — M1990 Unspecified osteoarthritis, unspecified site: Secondary | ICD-10-CM

## 2017-07-29 DIAGNOSIS — Y990 Civilian activity done for income or pay: Secondary | ICD-10-CM | POA: Diagnosis not present

## 2017-07-29 DIAGNOSIS — E876 Hypokalemia: Secondary | ICD-10-CM | POA: Diagnosis present

## 2017-07-29 DIAGNOSIS — I10 Essential (primary) hypertension: Secondary | ICD-10-CM | POA: Diagnosis present

## 2017-07-29 DIAGNOSIS — R0602 Shortness of breath: Secondary | ICD-10-CM | POA: Diagnosis present

## 2017-07-29 DIAGNOSIS — S2242XA Multiple fractures of ribs, left side, initial encounter for closed fracture: Secondary | ICD-10-CM | POA: Diagnosis present

## 2017-07-29 DIAGNOSIS — K219 Gastro-esophageal reflux disease without esophagitis: Secondary | ICD-10-CM | POA: Diagnosis present

## 2017-07-29 DIAGNOSIS — Z98811 Dental restoration status: Secondary | ICD-10-CM

## 2017-07-29 DIAGNOSIS — Z7982 Long term (current) use of aspirin: Secondary | ICD-10-CM | POA: Diagnosis not present

## 2017-07-29 DIAGNOSIS — Z973 Presence of spectacles and contact lenses: Secondary | ICD-10-CM

## 2017-07-29 DIAGNOSIS — Z79899 Other long term (current) drug therapy: Secondary | ICD-10-CM | POA: Diagnosis not present

## 2017-07-29 DIAGNOSIS — S2249XA Multiple fractures of ribs, unspecified side, initial encounter for closed fracture: Secondary | ICD-10-CM | POA: Diagnosis present

## 2017-07-29 HISTORY — DX: Dental restoration status: Z98.811

## 2017-07-29 HISTORY — DX: Hyperlipidemia, unspecified: E78.5

## 2017-07-29 HISTORY — DX: Essential (primary) hypertension: I10

## 2017-07-29 HISTORY — DX: Unspecified osteoarthritis, unspecified site: M19.90

## 2017-07-29 HISTORY — DX: Presence of spectacles and contact lenses: Z97.3

## 2017-07-29 HISTORY — DX: Gastro-esophageal reflux disease without esophagitis: K21.9

## 2017-07-29 LAB — BASIC METABOLIC PANEL
Anion gap: 9 (ref 5–15)
BUN: 20 mg/dL (ref 6–20)
CO2: 27 mmol/L (ref 22–32)
CREATININE: 0.82 mg/dL (ref 0.61–1.24)
Calcium: 9.1 mg/dL (ref 8.9–10.3)
Chloride: 100 mmol/L — ABNORMAL LOW (ref 101–111)
GFR calc non Af Amer: >60 mL/min (ref >60)
Glucose, Bld: 102 mg/dL — ABNORMAL HIGH (ref 65–99)
POTASSIUM: 3.3 mmol/L — AB (ref 3.5–5.1)
SODIUM: 136 mmol/L (ref 135–145)

## 2017-07-29 LAB — CBC
HCT: 37.7 % — ABNORMAL LOW (ref 39.0–52.0)
HEMOGLOBIN: 12.6 g/dL — AB (ref 13.0–17.0)
MCH: 30.7 pg (ref 26.0–34.0)
MCHC: 33.4 g/dL (ref 30.0–36.0)
MCV: 92 fL (ref 78.0–100.0)
Platelets: 161 10*3/uL (ref 150–400)
RBC: 4.1 MIL/uL — AB (ref 4.22–5.81)
RDW: 12.7 % (ref 11.5–15.5)
WBC: 8.5 10*3/uL (ref 4.0–10.5)

## 2017-07-29 LAB — MRSA PCR SCREENING: MRSA BY PCR: NEGATIVE

## 2017-07-29 MED ORDER — SODIUM CHLORIDE 0.9 % IV SOLN
INTRAVENOUS | Status: DC
  Administered 2017-07-29 – 2017-07-30 (×2): via INTRAVENOUS

## 2017-07-29 MED ORDER — LISINOPRIL 10 MG PO TABS
10.0000 mg | ORAL_TABLET | Freq: Every day | ORAL | Status: DC
  Administered 2017-07-29 – 2017-07-30 (×2): 10 mg via ORAL
  Filled 2017-07-29 (×2): qty 1

## 2017-07-29 MED ORDER — POTASSIUM CHLORIDE CRYS ER 20 MEQ PO TBCR
40.0000 meq | EXTENDED_RELEASE_TABLET | Freq: Once | ORAL | Status: AC
  Administered 2017-07-29: 40 meq via ORAL
  Filled 2017-07-29: qty 2

## 2017-07-29 MED ORDER — FINASTERIDE 5 MG PO TABS
5.0000 mg | ORAL_TABLET | Freq: Every day | ORAL | Status: DC
  Administered 2017-07-29 – 2017-07-30 (×2): 5 mg via ORAL
  Filled 2017-07-29 (×2): qty 1

## 2017-07-29 MED ORDER — CHLORTHALIDONE 25 MG PO TABS
25.0000 mg | ORAL_TABLET | Freq: Every day | ORAL | Status: DC
  Administered 2017-07-29 – 2017-07-30 (×2): 25 mg via ORAL
  Filled 2017-07-29 (×2): qty 1

## 2017-07-29 MED ORDER — TRAMADOL HCL 50 MG PO TABS
50.0000 mg | ORAL_TABLET | Freq: Four times a day (QID) | ORAL | Status: DC | PRN
  Administered 2017-07-29 – 2017-07-30 (×2): 50 mg via ORAL
  Filled 2017-07-29 (×2): qty 1

## 2017-07-29 MED ORDER — OXYCODONE HCL 5 MG PO TABS: 2.5000 mg | ORAL_TABLET | ORAL | Status: DC | PRN

## 2017-07-29 MED ORDER — ONDANSETRON 4 MG PO TBDP: 4.0000 mg | ORAL_TABLET | Freq: Four times a day (QID) | ORAL | Status: DC | PRN

## 2017-07-29 MED ORDER — TAMSULOSIN HCL 0.4 MG PO CAPS
0.8000 mg | ORAL_CAPSULE | Freq: Every day | ORAL | Status: DC
  Administered 2017-07-29: 0.8 mg via ORAL
  Filled 2017-07-29: qty 2

## 2017-07-29 MED ORDER — POLYETHYLENE GLYCOL 3350 17 G PO PACK
17.0000 g | PACK | Freq: Every day | ORAL | Status: DC
  Administered 2017-07-29 – 2017-07-30 (×2): 17 g via ORAL
  Filled 2017-07-29 (×2): qty 1

## 2017-07-29 MED ORDER — SIMVASTATIN 20 MG PO TABS
10.0000 mg | ORAL_TABLET | Freq: Every day | ORAL | Status: DC
  Administered 2017-07-29: 10 mg via ORAL
  Filled 2017-07-29: qty 1

## 2017-07-29 MED ORDER — MORPHINE SULFATE (PF) 4 MG/ML IV SOLN: 1.0000 mg | INTRAVENOUS | Status: DC | PRN

## 2017-07-29 MED ORDER — ENOXAPARIN SODIUM 40 MG/0.4ML ~~LOC~~ SOLN
40.0000 mg | SUBCUTANEOUS | Status: DC
  Administered 2017-07-29 – 2017-07-30 (×2): 40 mg via SUBCUTANEOUS
  Filled 2017-07-29 (×2): qty 0.4

## 2017-07-29 MED ORDER — OXYCODONE HCL 5 MG PO TABS: 5.0000 mg | ORAL_TABLET | ORAL | Status: DC | PRN

## 2017-07-29 MED ORDER — ONDANSETRON HCL 4 MG/2ML IJ SOLN: 4.0000 mg | Freq: Four times a day (QID) | INTRAMUSCULAR | Status: DC | PRN

## 2017-07-29 MED ORDER — SODIUM CHLORIDE 0.9 % IV BOLUS: 1000.0000 mL | Freq: Once | INTRAVENOUS | Status: DC

## 2017-07-29 MED ORDER — MORPHINE SULFATE (PF) 4 MG/ML IV SOLN: 2.0000 mg | INTRAVENOUS | Status: DC | PRN

## 2017-07-29 MED ORDER — KETOROLAC TROMETHAMINE 15 MG/ML IJ SOLN
15.0000 mg | Freq: Three times a day (TID) | INTRAMUSCULAR | Status: AC
  Administered 2017-07-29 (×3): 15 mg via INTRAVENOUS
  Filled 2017-07-29 (×3): qty 1

## 2017-07-29 MED ORDER — OXYCODONE HCL 5 MG PO TABS
10.0000 mg | ORAL_TABLET | ORAL | Status: DC | PRN
  Administered 2017-07-29 – 2017-07-30 (×6): 10 mg via ORAL
  Filled 2017-07-29 (×6): qty 2

## 2017-07-29 MED ORDER — MORPHINE SULFATE (PF) 4 MG/ML IV SOLN: 4.0000 mg | INTRAVENOUS | Status: DC | PRN

## 2017-07-29 NOTE — Progress Notes (Signed)
Messaged Dr. Halina Maidens for Miralax BID PO

## 2017-07-29 NOTE — H&P (Signed)
History   Colton Cummings is an 72 y.o. male.   Chief Complaint:  Chief Complaint  Patient presents with  . Shortness of Breath  . Fall    Shortness of Breath  Associated symptoms: chest pain   Associated symptoms: no abdominal pain, no headaches and no neck pain   Fall  Associated symptoms include chest pain. Pertinent negatives include no abdominal pain, headaches or neck pain.  This is a 72 year old gentleman who was stepping over a piece of equipment at work when he tripped and fell hitting his left side on Friday.  Because of chest pain and some difficulty breathing he presented to the emergency department.  He was found on CT scan to have multiple left lateral rib fractures.  He denied loss of consciousness.  He has no other complaints.  He denies abdominal pain.  Past Medical History:  Diagnosis Date  . Acute appendicitis 06/16/2015  . Acute lower GI bleeding after colon polypectomy 06/13/2016  . Anal polyp 2013  . Arthritis   . Dental crowns present   . GERD (gastroesophageal reflux disease)   . HTN (hypertension)   . Hyperlipidemia   . Wears glasses     Past Surgical History:  Procedure Laterality Date  . COLONOSCOPY    . COLONOSCOPY N/A 06/12/2016   Procedure: COLONOSCOPY;  Surgeon: Manus Gunning, MD;  Location: [facility];  Service: Gastroenterology;  Laterality: N/A;  . HEMORRHOID SURGERY  01/11/2012   Internal hemorrhoid ligation  . LAPAROSCOPIC APPENDECTOMY N/A 06/16/2015   Procedure: APPENDECTOMY LAPAROSCOPIC;  Surgeon: Excell Seltzer, MD;  Location: [facility];  Service: General;  Laterality: N/A;  . RECTAL POLYPECTOMY  01/11/2012   Excision of anal canal mass.  . TONSILLECTOMY    . ULNAR NERVE TRANSPOSITION Left 03/31/2014   Procedure: DECOMPRESSION  LEFT ULNAR NERVE;  Surgeon: Daryll Brod, MD;  Location: [facility];  Service: Orthopedics;  Laterality: Left;    Family History  Problem Relation Age of Onset  . Colon cancer Neg Hx   .  Esophageal cancer Neg Hx   . Rectal cancer Neg Hx   . Stomach cancer Neg Hx    Social History:  reports that he quit smoking about 17 years ago. His smoking use included cigarettes. He has never used smokeless tobacco. He reports that he drinks about 0.5 - 1.0 oz of alcohol per week. He reports that he does not use drugs.  Allergies  No Known Allergies  Home Medications   Medications Prior to Admission  Medication Sig Dispense Refill  . acetaminophen (TYLENOL) 500 MG tablet Take 500-1,000 mg by mouth every 6 (six) hours as needed for headache (pain).     Marland Kitchen aspirin EC 81 MG tablet Take 81 mg by mouth daily.    . chlorthalidone (HYGROTON) 25 MG tablet Take 25 mg by mouth daily.     . cholecalciferol (VITAMIN D) 1000 units tablet Take 1,000 Units by mouth at bedtime.     . finasteride (PROSCAR) 5 MG tablet Take 5 mg by mouth daily.     . fluticasone (FLONASE) 50 MCG/ACT nasal spray Place 1 spray into both nostrils daily as needed (seasonal allergies).    Marland Kitchen ibuprofen (ADVIL,MOTRIN) 200 MG tablet Take 400-800 mg by mouth 2 (two) times daily.    Marland Kitchen lisinopril (PRINIVIL,ZESTRIL) 10 MG tablet Take 10 mg by mouth daily.     Marland Kitchen loratadine (CLARITIN) 10 MG tablet Take 10 mg by mouth daily.     Marland Kitchen  Multiple Vitamin (MULTIVITAMIN WITH MINERALS) TABS tablet Take 1 tablet by mouth daily. Men's 50+    . omeprazole (PRILOSEC) 20 MG capsule Take 20 mg by mouth daily.     Marland Kitchen orlistat (ALLI) 60 MG capsule Take 60 mg by mouth 3 (three) times daily as needed (weight loss aid).     . simvastatin (ZOCOR) 10 MG tablet Take 10 mg by mouth daily after supper.     . tamsulosin (FLOMAX) 0.4 MG CAPS capsule Take 0.8 mg by mouth at bedtime.      Trauma Course   Results for orders placed or performed during the hospital encounter of 07/28/17 (from the past 48 hour(s))  Basic metabolic panel     Status: Abnormal   Collection Time: 07/28/17  8:40 PM  Result Value Ref Range   Sodium 137 135 - 145 mmol/L   Potassium 3.7  3.5 - 5.1 mmol/L   Chloride 102 101 - 111 mmol/L   CO2 23 22 - 32 mmol/L   Glucose, Bld 141 (H) 65 - 99 mg/dL   BUN 23 (H) 6 - 20 mg/dL   Creatinine, Ser 0.85 0.61 - 1.24 mg/dL   Calcium 9.5 8.9 - 10.3 mg/dL   GFR calc non Af Amer >60 >60 mL/min   GFR calc Af Amer >60 >60 mL/min    Comment: (NOTE) The eGFR has been calculated using the CKD EPI equation. This calculation has not been validated in all clinical situations. eGFR's persistently <60 mL/min signify possible Chronic Kidney Disease.    Anion gap 12 5 - 15    Comment: Performed at [facility] [address] [place], [place] [address]  CBC     Status: Abnormal   Collection Time: 07/28/17  8:40 PM  Result Value Ref Range   WBC 11.6 (H) 4.0 - 10.5 K/uL   RBC 4.57 4.22 - 5.81 MIL/uL   Hemoglobin 14.4 13.0 - 17.0 g/dL   HCT 41.6 39.0 - 52.0 %   MCV 91.0 78.0 - 100.0 fL   MCH 31.5 26.0 - 34.0 pg   MCHC 34.6 30.0 - 36.0 g/dL   RDW 12.7 11.5 - 15.5 %   Platelets 187 150 - 400 K/uL    Comment: Performed at [facility], [place] [address], [place], [place] [address]   Dg Chest 2 View  Result Date: 07/28/2017 CLINICAL DATA:  Shortness of breath EXAM: CHEST - 2 VIEW COMPARISON:  06/16/2015 FINDINGS: Hyperinflation. No pleural effusion. Minimal atelectasis at the bases. Borderline to mild cardiomegaly with aortic atherosclerosis. No pneumothorax. Mild degenerative changes of the spine. IMPRESSION: No active cardiopulmonary disease. Mild subsegmental atelectasis at the bases. Electronically Signed   By: Donavan Foil M.D.   On: 07/28/2017 21:14   Ct Chest W Contrast  Result Date: 07/28/2017 CLINICAL DATA:  Acute onset of shortness of breath after fall on left side at work. Upper abdominal swelling. Left rib tenderness. EXAM: CT CHEST, ABDOMEN, AND PELVIS WITH CONTRAST TECHNIQUE: Multidetector CT imaging of the chest, abdomen and pelvis was performed following the standard protocol during bolus administration of intravenous  contrast. CONTRAST:  155m ISOVUE-300 IOPAMIDOL (ISOVUE-300) INJECTION 61% COMPARISON:  CT of the chest performed 04/16/2015, and CT of the abdomen and pelvis from 06/16/2015 FINDINGS: CT CHEST FINDINGS Cardiovascular: The heart is normal in size. The thoracic aorta is unremarkable, aside from scattered calcification. Mild calcification is noted along the proximal great vessels. Mediastinum/Nodes: The mediastinum is otherwise unremarkable. No mediastinal lymphadenopathy is seen.  No pericardial effusion is identified. The visualized portions of the thyroid gland are unremarkable. No axillary lymphadenopathy is seen. Lungs/Pleura: Minimal bilateral atelectasis is noted. No pleural effusion or pneumothorax is seen. No masses are identified. Musculoskeletal: There are minimally displaced fractures of the left lateral fifth through eighth ribs. The visualized musculature is unremarkable in appearance. CT ABDOMEN PELVIS FINDINGS Hepatobiliary: The liver is unremarkable in appearance. The gallbladder is unremarkable in appearance. The common bile duct remains normal in caliber. Pancreas: The pancreas is within normal limits. Spleen: The spleen is unremarkable in appearance. Adrenals/Urinary Tract: The adrenal glands are unremarkable in appearance. A right renal cyst is noted. Nonspecific perinephric stranding is noted bilaterally. There is no evidence of hydronephrosis. No renal or ureteral stones are identified. Stomach/Bowel: The stomach is unremarkable in appearance. The small bowel is within normal limits. The patient is status post appendectomy. The colon is unremarkable in appearance. Vascular/Lymphatic: Scattered calcification is seen along the abdominal aorta and its branches. The abdominal aorta is otherwise grossly unremarkable. The inferior vena cava is grossly unremarkable. No retroperitoneal lymphadenopathy is seen. No pelvic sidewall lymphadenopathy is identified. Reproductive: The bladder is mildly distended  and grossly unremarkable. The prostate is enlarged, measuring 6.1 cm in transverse dimension, with impression on the base of the bladder. Other: No additional soft tissue abnormalities are seen. Musculoskeletal: No acute osseous abnormalities are identified. The visualized musculature is unremarkable in appearance. IMPRESSION: 1. Minimally displaced fractures of the left lateral fifth through eighth ribs. 2. Minimal bilateral atelectasis.  Lungs otherwise clear. 3. Right renal cyst noted. 4. Significantly enlarged prostate, with impression on the base of the bladder. Would correlate with PSA. Electronically Signed   By: Garald Balding M.D.   On: 07/28/2017 23:39   Ct Abdomen Pelvis W Contrast  Result Date: 07/28/2017 CLINICAL DATA:  Acute onset of shortness of breath after fall on left side at work. Upper abdominal swelling. Left rib tenderness. EXAM: CT CHEST, ABDOMEN, AND PELVIS WITH CONTRAST TECHNIQUE: Multidetector CT imaging of the chest, abdomen and pelvis was performed following the standard protocol during bolus administration of intravenous contrast. CONTRAST:  122m ISOVUE-300 IOPAMIDOL (ISOVUE-300) INJECTION 61% COMPARISON:  CT of the chest performed 04/16/2015, and CT of the abdomen and pelvis from 06/16/2015 FINDINGS: CT CHEST FINDINGS Cardiovascular: The heart is normal in size. The thoracic aorta is unremarkable, aside from scattered calcification. Mild calcification is noted along the proximal great vessels. Mediastinum/Nodes: The mediastinum is otherwise unremarkable. No mediastinal lymphadenopathy is seen. No pericardial effusion is identified. The visualized portions of the thyroid gland are unremarkable. No axillary lymphadenopathy is seen. Lungs/Pleura: Minimal bilateral atelectasis is noted. No pleural effusion or pneumothorax is seen. No masses are identified. Musculoskeletal: There are minimally displaced fractures of the left lateral fifth through eighth ribs. The visualized musculature  is unremarkable in appearance. CT ABDOMEN PELVIS FINDINGS Hepatobiliary: The liver is unremarkable in appearance. The gallbladder is unremarkable in appearance. The common bile duct remains normal in caliber. Pancreas: The pancreas is within normal limits. Spleen: The spleen is unremarkable in appearance. Adrenals/Urinary Tract: The adrenal glands are unremarkable in appearance. A right renal cyst is noted. Nonspecific perinephric stranding is noted bilaterally. There is no evidence of hydronephrosis. No renal or ureteral stones are identified. Stomach/Bowel: The stomach is unremarkable in appearance. The small bowel is within normal limits. The patient is status post appendectomy. The colon is unremarkable in appearance. Vascular/Lymphatic: Scattered calcification is seen along the abdominal aorta and its branches. The abdominal aorta is  otherwise grossly unremarkable. The inferior vena cava is grossly unremarkable. No retroperitoneal lymphadenopathy is seen. No pelvic sidewall lymphadenopathy is identified. Reproductive: The bladder is mildly distended and grossly unremarkable. The prostate is enlarged, measuring 6.1 cm in transverse dimension, with impression on the base of the bladder. Other: No additional soft tissue abnormalities are seen. Musculoskeletal: No acute osseous abnormalities are identified. The visualized musculature is unremarkable in appearance. IMPRESSION: 1. Minimally displaced fractures of the left lateral fifth through eighth ribs. 2. Minimal bilateral atelectasis.  Lungs otherwise clear. 3. Right renal cyst noted. 4. Significantly enlarged prostate, with impression on the base of the bladder. Would correlate with PSA. Electronically Signed   By: Garald Balding M.D.   On: 07/28/2017 23:39    Review of Systems  Respiratory: Positive for shortness of breath.   Cardiovascular: Positive for chest pain.  Gastrointestinal: Negative for abdominal pain.  Musculoskeletal: Negative for back pain  and neck pain.  Neurological: Negative for headaches.  All other systems reviewed and are negative.   Blood pressure (!) 150/79, pulse 85, temperature 98.4 F (36.9 C), temperature source Oral, resp. rate (!) 21, height 6' 2"  (1.88 m), weight 117.9 kg (260 lb), SpO2 99 %. Physical Exam  Constitutional: He is oriented to person, place, and time. He appears well-developed and well-nourished. No distress.  HENT:  Head: Normocephalic and atraumatic.  Right Ear: External ear normal.  Left Ear: External ear normal.  Nose: Nose normal.  Mouth/Throat: Oropharynx is clear and moist. No oropharyngeal exudate.  Eyes: Pupils are equal, round, and reactive to light. Right eye exhibits no discharge. Left eye exhibits no discharge. No scleral icterus.  Neck: Normal range of motion. Neck supple. No tracheal deviation present.  Cardiovascular: Normal rate, regular rhythm, normal heart sounds and intact distal pulses.  No murmur heard. Respiratory: Effort normal. He exhibits tenderness.  Left-sided chest wall tenderness  GI: Soft. Bowel sounds are normal. He exhibits no distension. There is no tenderness.  Musculoskeletal: Normal range of motion. He exhibits no edema, tenderness or deformity.  Neurological: He is alert and oriented to person, place, and time.  Skin: Skin is warm and dry. He is not diaphoretic. No erythema.  Psychiatric: His behavior is normal. Judgment normal.     Assessment/Plan Patient status post fall with multiple left-sided rib fractures  Because of his decreased oxygen saturation and discomfort, he will be admitted to the stepdown unit.  He will undergo pain control with aggressive pulmonary toilet.  He has no other apparent injuries.  We will repeat his chest x-ray later today.  He is in agreement with the plans  Gentry Pilson A 07/29/2017, 3:09 AM   Procedures

## 2017-07-29 NOTE — Progress Notes (Signed)
  Subjective: L ribs sore  Objective: Vital signs in last 24 hours: Temp:  [97.3 F (36.3 C)-98.4 F (36.9 C)] 97.6 F (36.4 C) (04/21 0857) Pulse Rate:  [65-102] 65 (04/21 0300) Resp:  [12-27] 12 (04/21 0300) BP: (120-158)/(72-96) 134/72 (04/21 0300) SpO2:  [92 %-99 %] 95 % (04/21 0300) Weight:  [117.9 kg (260 lb)] 117.9 kg (260 lb) (04/20 2006)    Intake/Output from previous day: 04/20 0701 - 04/21 0700 In: 64.2 [I.V.:64.2] Out: 250 [Urine:250] Intake/Output this shift: No intake/output data recorded.  General appearance: alert and cooperative Resp: clear to auscultation bilaterally Chest wall: left sided chest wall tenderness Cardio: regular rate and rhythm GI: soft, non-tender; bowel sounds normal; no masses,  no organomegaly Extremities: calves soft  Lab Results: CBC  Recent Labs    07/28/17 2040 07/29/17 0509  WBC 11.6* 8.5  HGB 14.4 12.6*  HCT 41.6 37.7*  PLT 187 161   BMET Recent Labs    07/28/17 2040 07/29/17 0509  NA 137 136  K 3.7 3.3*  CL 102 100*  CO2 23 27  GLUCOSE 141* 102*  BUN 23* 20  CREATININE 0.85 0.82  CALCIUM 9.5 9.1   Assessment/Plan: Fall L rib FX 5-8 - pulm toilet, multimodal pain control, only doing 500 on IS so far, CXR in AM FEN - diet, replete hypokalemia VTE - Lovenox Dispo - PT   LOS: 0 days    [name], MD, MPH, FACS Trauma: [phone] General Surgery: [phone]  4/21/2019Patient ID: Colton Cummings, male   DOB: 1945/11/26, 72 y.o.   MRN: [id number]

## 2017-07-30 ENCOUNTER — Inpatient Hospital Stay (HOSPITAL_COMMUNITY): Payer: Medicare Other

## 2017-07-30 LAB — BASIC METABOLIC PANEL
ANION GAP: 8 (ref 5–15)
BUN: 21 mg/dL — AB (ref 6–20)
CALCIUM: 9 mg/dL (ref 8.9–10.3)
CO2: 27 mmol/L (ref 22–32)
Chloride: 99 mmol/L — ABNORMAL LOW (ref 101–111)
Creatinine, Ser: 0.86 mg/dL (ref 0.61–1.24)
GFR calc Af Amer: >60 mL/min (ref >60)
GFR calc non Af Amer: >60 mL/min (ref >60)
Glucose, Bld: 98 mg/dL (ref 65–99)
POTASSIUM: 4.2 mmol/L (ref 3.5–5.1)
SODIUM: 134 mmol/L — AB (ref 135–145)

## 2017-07-30 MED ORDER — METHOCARBAMOL 500 MG PO TABS
500.0000 mg | ORAL_TABLET | Freq: Three times a day (TID) | ORAL | Status: DC | PRN
  Administered 2017-07-30 (×2): 500 mg via ORAL
  Filled 2017-07-30 (×2): qty 1

## 2017-07-30 MED ORDER — OXYCODONE HCL 10 MG PO TABS: 10.0000 mg | ORAL_TABLET | ORAL | 0 refills | Status: DC | PRN

## 2017-07-30 MED ORDER — ASPIRIN-ACETAMINOPHEN-CAFFEINE 250-250-65 MG PO TABS
2.0000 | ORAL_TABLET | Freq: Once | ORAL | Status: AC
  Administered 2017-07-30: 2 via ORAL
  Filled 2017-07-30: qty 2

## 2017-07-30 MED ORDER — METHOCARBAMOL 500 MG PO TABS: 500.0000 mg | ORAL_TABLET | Freq: Three times a day (TID) | ORAL | 0 refills | Status: DC | PRN

## 2017-07-30 MED ORDER — TRAMADOL HCL 50 MG PO TABS: 50.0000 mg | ORAL_TABLET | Freq: Four times a day (QID) | ORAL | 0 refills | Status: DC | PRN

## 2017-07-30 MED ORDER — ACETYLCYSTEINE 20 % IN SOLN
3.0000 mL | Freq: Once | RESPIRATORY_TRACT | Status: AC
  Administered 2017-07-30: 3 mL via RESPIRATORY_TRACT
  Filled 2017-07-30: qty 4

## 2017-07-30 MED ORDER — ALBUTEROL SULFATE (2.5 MG/3ML) 0.083% IN NEBU
INHALATION_SOLUTION | RESPIRATORY_TRACT | Status: AC
  Filled 2017-07-30: qty 3

## 2017-07-30 MED ORDER — ALBUTEROL SULFATE (2.5 MG/3ML) 0.083% IN NEBU
2.5000 mg | INHALATION_SOLUTION | Freq: Once | RESPIRATORY_TRACT | Status: AC
  Administered 2017-07-30: 2.5 mg via RESPIRATORY_TRACT

## 2017-07-30 NOTE — Progress Notes (Addendum)
1100: Pt had progressed nicely throughout the day. Started the day pulling 250 on incentive spirometer (though report states he can reach as high as 1000). Pt has expiratory wheezing on assessment. Pt complained of left rib pain only. Pt ambulated to the bathroom, washed his face and brushed his teeth independently. Patient encouraged to sit in the chair instead of getting back in bed. Patient has been in the chair the whole day, family visits, and has gotten himself up to 1250 on the incentive spirometer.  Pt currently waiting for a breathing treatment and evaluation from physical therapy for discharge.  1500: Physical therapy worked with patient and he did very well. Physical therapy will leave a note and then Nurse will paged doctor for update and discharge orders.

## 2017-07-30 NOTE — Discharge Summary (Signed)
Patient ID: Colton Cummings [id number] [birth year] 72 y.o.  Admit date: 07/28/2017 Discharge date: 07/30/2017  Admitting Diagnosis: Fall L rib 5-8 fx  Discharge Diagnosis Patient Active Problem List   Diagnosis Date Noted  . Multiple rib fractures 07/29/2017  . Essential hypertension 06/12/2016  . History of colonic polyps 01/02/2012  . Hemorrhoids, internal 01/02/2012    Consultants None   Reason for Admission: This is a 72 year old gentleman who was stepping over a piece of equipment at work when he tripped and fell hitting his left side on Friday.  Because of chest pain and some difficulty breathing he presented to the emergency department.  He was found on CT scan to have multiple left lateral rib fractures.  He denied loss of consciousness.  He has no other complaints.  He denies abdominal pain.   Procedures none  Hospital Course:  The patient was admitted for pain control secondary to his fall with multiple left-sided rib fractures.  He was given Robaxin as well as oxycodone and ultram prn.  This seemed to control his pain.  PT was ordered and no follow up needed.  He was otherwise stable on HD 2 for DC home.  He will follow up with his PCP as needed.  Physical Exam: See exam from earlier today.  Allergies as of 07/30/2017   No Known Allergies     Medication List    TAKE these medications   acetaminophen 500 MG tablet Commonly known as:  TYLENOL Take 500-1,000 mg by mouth every 6 (six) hours as needed for headache (pain).   aspirin EC 81 MG tablet Take 81 mg by mouth daily.   chlorthalidone 25 MG tablet Commonly known as:  HYGROTON Take 25 mg by mouth daily.   cholecalciferol 1000 units tablet Commonly known as:  VITAMIN D Take 1,000 Units by mouth at bedtime.   finasteride 5 MG tablet Commonly known as:  PROSCAR Take 5 mg by mouth daily.   fluticasone 50 MCG/ACT nasal spray Commonly known as:  FLONASE Place 1 spray into both nostrils daily as  needed (seasonal allergies).   ibuprofen 200 MG tablet Commonly known as:  ADVIL,MOTRIN Take 400-800 mg by mouth 2 (two) times daily.   lisinopril 10 MG tablet Commonly known as:  PRINIVIL,ZESTRIL Take 10 mg by mouth daily.   loratadine 10 MG tablet Commonly known as:  CLARITIN Take 10 mg by mouth daily.   methocarbamol 500 MG tablet Commonly known as:  ROBAXIN Take 1 tablet (500 mg total) by mouth every 8 (eight) hours as needed for muscle spasms.   multivitamin with minerals Tabs tablet Take 1 tablet by mouth daily. Men's 50+   omeprazole 20 MG capsule Commonly known as:  PRILOSEC Take 20 mg by mouth daily.   orlistat 60 MG capsule Commonly known as:  ALLI Take 60 mg by mouth 3 (three) times daily as needed (weight loss aid).   Oxycodone HCl 10 MG Tabs Take 1 tablet (10 mg total) by mouth every 4 (four) hours as needed.   simvastatin 10 MG tablet Commonly known as:  ZOCOR Take 10 mg by mouth daily after supper.   tamsulosin 0.4 MG Caps capsule Commonly known as:  FLOMAX Take 0.8 mg by mouth at bedtime.   traMADol 50 MG tablet Commonly known as:  ULTRAM Take 1 tablet (50 mg total) by mouth every 6 (six) hours as needed (mild pain).        Follow-up Information    Derinda Late,  MD Follow up.   Specialty:  Family Medicine Why:  as needed Contact information: [place] [address] [phone]        [facility] Follow up today.   Why:  call with questions or as needed Contact information: [place] [address] [phone]          Signed: Saverio Danker, [facility] 07/30/2017, [place] PM Pager: [phone]

## 2017-07-30 NOTE — Progress Notes (Signed)
Called Trauma Sx to ask for med to loosen cough pt having diff time expelling sectetions, wheezing at times, asking for muscle relaxer per L rib spasms.

## 2017-07-30 NOTE — Progress Notes (Signed)
  Subjective: C/O HA, usually takes Excedrin migraine at home, MM relaxer helped rib pain  Objective: Vital signs in last 24 hours: Temp:  [97.3 F (36.3 C)-98.8 F (37.1 C)] 98.4 F (36.9 C) (04/22 0005) Pulse Rate:  [64-76] 68 (04/22 0005) Resp:  [12-20] 18 (04/22 0005) BP: (129-141)/(75-78) 129/75 (04/22 0005) SpO2:  [91 %-94 %] 92 % (04/22 0005) Last BM Date: 07/28/17  Intake/Output from previous day: 04/21 0701 - 04/22 0700 In: 150 [P.O.:150] Out: 1100 [Urine:1100] Intake/Output this shift: No intake/output data recorded.  General appearance: alert and cooperative Resp: clear to auscultation bilaterally Chest wall: left sided chest wall tenderness GI: soft, NT, ND Extremities: calves soft  Lab Results: CBC  Recent Labs    07/28/17 2040 07/29/17 0509  WBC 11.6* 8.5  HGB 14.4 12.6*  HCT 41.6 37.7*  PLT 187 161   BMET Recent Labs    07/29/17 0509 07/30/17 0444  NA 136 134*  K 3.3* 4.2  CL 100* 99*  CO2 27 27  GLUCOSE 102* 98  BUN 20 21*  CREATININE 0.82 0.86  CALCIUM 9.1 9.0    Anti-infectives: Anti-infectives (From admission, onward)   None      Assessment/Plan: Fall L rib FX 5-8 - pulm toilet, multimodal pain control, CXR good HA - Excedrin Migraine FEN - diet, K now good VTE - Lovenox Dispo - PT, hope to D/C this PM   LOS: 1 day    [name], MD, MPH, FACS Trauma: [phone] General Surgery: [phone]  4/22/2019Patient ID: Colton Cummings, male   DOB: Jan 14, 1946, 72 y.o.   MRN: [id number]

## 2017-07-30 NOTE — Discharge Instructions (Signed)

## 2017-07-30 NOTE — Evaluation (Signed)
Physical Therapy Evaluation/Discharge Patient Details Name: Colton Cummings MRN: [id number] DOB: 06/19/1945 Today's Date: 07/30/2017   History of Present Illness  72 y.o. male admitted on 07/28/17 with fall and resultant L sided multiple rib fractures (5-8).  Pt fell at work onto his side.  Pt with other significant PMH of HTN, arhtritis, and ulnar nerve transposition 03/31/14.      Clinical Impression  Pt was able to demonstrate good mobility around the hospital, stairs, transitions, slow, but generally steady gait and Incentive spirometer to 1,500 mL this PM.  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 His wife is available as needed.  PT to sign off as he does not have any acute or follow up therapy needs at this time.      Follow Up Recommendations No PT follow up    Equipment Recommendations  None recommended by PT    Recommendations for Other Services   NA    Precautions / Restrictions Precautions Precautions: Other (comment) Precaution Comments: rib      Mobility  Bed Mobility               General bed mobility comments: We spoke a bit about bed mobility and how it will be easier to get out of the right side of the bed and he reports he has already discovered this and that is the side he gets out of at home.   Transfers Overall transfer level: Modified independent Equipment used: None             General transfer comment: sit to stand was safe and easy, pushing up with right arm.  Ambulation/Gait Ambulation/Gait assistance: Modified independent (Device/Increase time) Ambulation Distance (Feet): 250 Feet Assistive device: None Gait Pattern/deviations: WFL(Within Functional Limits)(slow, a bit of a wider base) Gait velocity: 2.03 ft/sec Gait velocity interpretation: 1.31 - 2.62 ft/sec, indicative of limited community ambulator General Gait Details: Pt reports he has really slowed  his gait speed down since his wife has had both of her hips replaced and walks slower.  He feels he can go faster, but this is his comfortable pace, feet are slightly toed out and wide.   Stairs Stairs: Yes Stairs assistance: Modified independent (Device/Increase time) Stair Management: One rail Left;One rail Right;Step to pattern;Alternating pattern;Forwards Number of Stairs: 10 General stair comments: Pt switched back and forth between step to and alternating pattern on the way up and step to only on the way down.  Used rail for safety and balance.          Balance Overall balance assessment: No apparent balance deficits (not formally assessed)                                           Pertinent Vitals/Pain Pain Assessment: Faces Faces Pain Scale: Hurts even more Pain Location: left ribs Pain Descriptors / Indicators: Grimacing;Guarding Pain Intervention(s): Limited activity within patient's tolerance;Monitored during session;Repositioned    Home Living Family/patient expects to be discharged to:: Private residence Living Arrangements: Spouse/significant other Available Help at Discharge: Family;Available 24 hours/day Type of Home: House Home Access: Stairs to enter Entrance Stairs-Rails: None Entrance Stairs-Number of Steps: 1 Home Layout: One level Home Equipment: Walker - 2 wheels;Cane - single point;Shower seat - built in;Grab bars - toilet;Grab bars - tub/shower;Hand held  shower head      Prior Function Level of Independence: Independent         Comments: Pt drives, works full time (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      Hand Dominance   Dominant Hand: Right    Extremity/Trunk Assessment   Upper Extremity Assessment Upper Extremity Assessment: LUE deficits/detail LUE Deficits / Details: left arm limited by painful pulling in ribs.  He needed some assist to lift greater than 90 degrees limited by pain, advised him  it would not be comfortable to push or pull much with that arm at this time, but to keep moving it so it doesn't stiffen up.  LUE: Unable to fully assess due to pain    Lower Extremity Assessment Lower Extremity Assessment: Overall WFL for tasks assessed    Cervical / Trunk Assessment Cervical / Trunk Assessment: Normal  Communication   Communication: No difficulties  Cognition Arousal/Alertness: Awake/alert Behavior During Therapy: WFL for tasks assessed/performed Overall Cognitive Status: Within Functional Limits for tasks assessed                                        General Comments General comments (skin integrity, edema, etc.): Education given on bracing with a pillow or by hugging himself to help with pain during coughing, sneezing or laughing.      Exercises Other Exercises Other Exercises: Reviewed IS and pt was able to max volume 1,500 mL with cues for how frequently he should practice.    Assessment/Plan    PT Assessment Patent does not need any further PT services         PT Goals (Current goals can be found in the Care Plan section)  Acute Rehab PT Goals Patient Stated Goal: to go home today PT Goal Formulation: All assessment and education complete, DC therapy               AM-PAC PT "6 Clicks" Daily Activity  Outcome Measure Difficulty turning over in bed (including adjusting bedclothes, sheets and blankets)?: A Little Difficulty moving from lying on back to sitting on the side of the bed? : A Little Difficulty sitting down on and standing up from a chair with arms (e.g., wheelchair, bedside commode, etc,.)?: None Help needed moving to and from a bed to chair (including a wheelchair)?: None Help needed walking in hospital room?: None Help needed climbing 3-5 steps with a railing? : A Little 6 Click Score: 21    End of Session   Activity Tolerance: Patient limited by pain Patient left: in chair;with call bell/phone within  reach Nurse Communication: Mobility status PT Visit Diagnosis: Difficulty in walking, not elsewhere classified (R26.2);Pain Pain - Right/Left: Left Pain - part of body: (ribs)    Time: 6546-5035 PT Time Calculation (min) (ACUTE ONLY): 20 min   Charges:       Wells Guiles B. Garik Diamant, PT, DPT [phone]   PT Evaluation $PT Eval Moderate Complexity: 1 Mod     07/30/2017, 3:30 PM

## 2017-08-16 ENCOUNTER — Encounter: Payer: Self-pay | Admitting: Internal Medicine

## 2017-08-16 ENCOUNTER — Ambulatory Visit: Payer: Medicare Other | Admitting: Internal Medicine

## 2017-08-16 VITALS — BP 128/72 | HR 74 | Ht 74.0 in | Wt 259.0 lb

## 2017-08-16 DIAGNOSIS — Z8719 Personal history of other diseases of the digestive system: Secondary | ICD-10-CM | POA: Diagnosis not present

## 2017-08-16 DIAGNOSIS — Z86010 Personal history of colonic polyps: Secondary | ICD-10-CM

## 2017-08-16 NOTE — Patient Instructions (Signed)
  Dr Carlean Purl is going to investigate more into your bleeding issue with the hematologist and we will be back in touch.    I appreciate the opportunity to care for you. Silvano Rusk, MD, [facility]

## 2017-08-16 NOTE — Progress Notes (Signed)
Colton Cummings 72 y.o. [birth year] [id number]  Assessment & Plan:   Encounter Diagnoses  Name Primary?  Marland Kitchen Hx of adenomatous colonic polyps Yes  . Hx of lower gastrointestinal bleeding twice after colonoscopic polypectomy     The patient has a history of precancerous polyps adenomas and sessile serrated adenomas, and close follow-up surveillance colonoscopy is indicated.  Unfortunately he has had post polypectomy bleeding twice.  He is understandably reluctant to undergo another colonoscopy given what he is been through.  Based upon what I know it does not seem like he has some sort of bleeding disorder but since he has had this happen twice with 2 different operators, I do have some question.  I am going to query a hematologist and consider referral for testing.  Depending upon what the hematologist tells me, we will either send him for testing or if they do not think it is necessary then would consider having our new physician, Dr. Lilia Argue perform his next colonoscopy in case snare polypectomy with cautery as necessary again. Dr. Lilia Argue will have advanced endoscopic training and may have skills that allow for reduction in post polypectomy bleeding using tools that I am not yet familiar with.  Overall I would have to say I think this is most likely bad luck so to speak, based upon what I understand but it is interesting that this is happened twice which raises questions about his blood clotting ability.  I do believe he was on baby aspirin as a preventive when he had those procedures before, that has since been stopped.  Though technically aspirin use is not associated with an increased risk of periprocedural bleeding.  I appreciate the opportunity to care for this patient. CC: Derinda Late, MD  08/21/2017   I communicated with Dr. Beryle Beams who recommended screening with PTT, CBC, von Willebrand panel so will do that and see    Subjective:   Chief Complaint: History of  colon polyps and history of bleeding after colonoscopy  HPI The patient is here to discuss repeating a colonoscopy because of a history of colon polyps, he has unfortunately suffered to post polypectomy gastrointestinal bleeds one in 2014 after Dr. Deatra Ina removed a 15 mm a sending colon polyp and then again last year after I removed 25 mm and  10 mm cecal polyps.  In both cases clips were applied to prevent post polypectomy bleeding.  He is leery of another colonoscopy, understandably so.  He did have a very large adenoma so repeating a colonoscopy for close follow-up does make sense, though there is not necessarily an absolute indication.  He feels well at this time with respect to his GI tract though he fell and had 4 minimally displaced rib fractures last month and is recovering from that after a brief hospitalization.  He has had surgeries before and is never had any increased bleeding and denies any type of extra bruising etc.  Notably he had a laparoscopic appendectomy and an ulnar nerve transposition surgery in between his 2 colonoscopies and no extra bleeding is reported.  Original history of precancerous colon polyps dates back to 2003.  He had hot biopsy removal of diminutive polyps then and in 2009 with no bleeding afterwards. No Known Allergies Current Meds  Medication Sig  . acetaminophen (TYLENOL) 500 MG tablet Take 500-1,000 mg by mouth every 6 (six) hours as needed for headache (pain).   . chlorthalidone (HYGROTON) 25 MG tablet Take 25 mg by mouth daily.   Marland Kitchen  cholecalciferol (VITAMIN D) 1000 units tablet Take 1,000 Units by mouth at bedtime.   . finasteride (PROSCAR) 5 MG tablet Take 5 mg by mouth daily.   . fluticasone (FLONASE) 50 MCG/ACT nasal spray Place 1 spray into both nostrils daily as needed (seasonal allergies).  Marland Kitchen ibuprofen (ADVIL,MOTRIN) 200 MG tablet Take 400-800 mg by mouth 2 (two) times daily.  Marland Kitchen lisinopril (PRINIVIL,ZESTRIL) 10 MG tablet Take 10 mg by mouth daily.   Marland Kitchen  loratadine (CLARITIN) 10 MG tablet Take 10 mg by mouth daily.   . methocarbamol (ROBAXIN) 500 MG tablet Take 1 tablet (500 mg total) by mouth every 8 (eight) hours as needed for muscle spasms.  . Multiple Vitamin (MULTIVITAMIN WITH MINERALS) TABS tablet Take 1 tablet by mouth daily. Men's 50+  . omeprazole (PRILOSEC) 20 MG capsule Take 20 mg by mouth daily.   Marland Kitchen orlistat (ALLI) 60 MG capsule Take 60 mg by mouth 3 (three) times daily as needed (weight loss aid).   . simvastatin (ZOCOR) 10 MG tablet Take 10 mg by mouth daily after supper.   . tamsulosin (FLOMAX) 0.4 MG CAPS capsule Take 0.8 mg by mouth at bedtime.  . [DISCONTINUED] aspirin EC 81 MG tablet Take 81 mg by mouth daily.  . [DISCONTINUED] Oxycodone HCl 10 MG TABS Take 1 tablet (10 mg total) by mouth every 4 (four) hours as needed.  . [DISCONTINUED] traMADol (ULTRAM) 50 MG tablet Take 1 tablet (50 mg total) by mouth every 6 (six) hours as needed (mild pain).   Past Medical History:  Diagnosis Date  . Acute appendicitis 06/16/2015  . Acute lower GI bleeding after colon polypectomy 06/13/2016  . Adenomatous colon polyp    since 1999  . Allergic rhinitis    since 2010  . Anal polyp 2013  . Arthritis 07/29/2017  . BPH (benign prostatic hyperplasia)    since 2009  . Broken ribs 07/2017   left  . Cervical spondylosis   . Chordee    since 2005  . COPD (chronic obstructive pulmonary disease) ([place])    since 2010  . Cubital tunnel syndrome on left 2015  . Dental crowns present 07/29/2017  . Depression    since 2006  . ED (erectile dysfunction)    since 2005  . GERD (gastroesophageal reflux disease) 07/29/2017  . Hearing loss   . HTN (hypertension) 07/29/2017  . Hx of migraine headaches   . Hyperlipidemia 07/29/2017  . Osteoarthritis    generalized  . Vitreous detachment of right eye 2009  . Wears glasses 07/29/2017   Past Surgical History:  Procedure Laterality Date  . CATARACT EXTRACTION W/ INTRAOCULAR LENS  IMPLANT,  BILATERAL    . COLONOSCOPY    . COLONOSCOPY N/A 06/12/2016   Procedure: COLONOSCOPY;  Surgeon: Manus Gunning, MD;  Location: [facility];  Service: Gastroenterology;  Laterality: N/A;  . HEMORRHOID SURGERY  01/11/2012   Internal hemorrhoid ligation  . LAPAROSCOPIC APPENDECTOMY N/A 06/16/2015   Procedure: APPENDECTOMY LAPAROSCOPIC;  Surgeon: Excell Seltzer, MD;  Location: [facility];  Service: General;  Laterality: N/A;  . RECTAL POLYPECTOMY  01/11/2012   Excision of anal canal mass.  . TONSILLECTOMY    . ULNAR NERVE TRANSPOSITION Left 03/31/2014   Procedure: DECOMPRESSION  LEFT ULNAR NERVE;  Surgeon: Daryll Brod, MD;  Location: 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      He is married.  Lives with his wife.  Enjoys [employer] basketball      Former smoker 1-2 drinks of alcohol every 2 months quit smoking 2001 approximately 60-pack-year history   family history includes Bipolar disorder in his daughter; Diabetes in his sister; Heart attack (age of onset: 50) in his mother; Hypertension in his brother and sister; Liver disease in his sister; Other in his mother.   Review of Systems As per HPI.  Objective:   Physical Exam BP 128/72   Pulse 74   Ht 6\' 2"  (1.88 m)   Wt 259 lb (117.5 kg)   BMI 33.25 kg/m  No acute distress  15 minutes time spent with patient > half in counseling coordination of care

## 2017-08-21 ENCOUNTER — Other Ambulatory Visit: Payer: Self-pay | Admitting: Internal Medicine

## 2017-08-21 DIAGNOSIS — Z862 Personal history of diseases of the blood and blood-forming organs and certain disorders involving the immune mechanism: Secondary | ICD-10-CM

## 2017-08-22 ENCOUNTER — Telehealth: Payer: Self-pay

## 2017-08-22 NOTE — Telephone Encounter (Signed)
-----   Message from Gatha Mayer, MD sent at 08/21/2017  5:12 PM EDT ----- Regarding: needs to get labs drawn Call him and have him come to lab to do tests the hematologist suggested I have him do to see if he has an underlying bleeding disorder that caused post-polypectomy bleeding x 2  I entered the orders

## 2017-08-22 NOTE — Telephone Encounter (Signed)
Spoke with Ronalee Belts and advised him to come get labs drawn at his convenience. He will and I told him the lab hours.

## 2017-08-27 ENCOUNTER — Telehealth: Payer: Self-pay

## 2017-08-27 ENCOUNTER — Other Ambulatory Visit: Payer: Medicare Other

## 2017-08-27 DIAGNOSIS — Z862 Personal history of diseases of the blood and blood-forming organs and certain disorders involving the immune mechanism: Secondary | ICD-10-CM

## 2017-08-27 DIAGNOSIS — K922 Gastrointestinal hemorrhage, unspecified: Secondary | ICD-10-CM

## 2017-08-27 NOTE — Telephone Encounter (Signed)
Lab called and needed another Von Willebrand panel ordered with [facility] as the resulting agency. Dr Carlean Purl and Barb Merino , CGRN helped to pick another test and order placed. Lab informed.

## 2017-08-29 ENCOUNTER — Other Ambulatory Visit (INDEPENDENT_AMBULATORY_CARE_PROVIDER_SITE_OTHER): Payer: Medicare Other

## 2017-08-29 DIAGNOSIS — Z862 Personal history of diseases of the blood and blood-forming organs and certain disorders involving the immune mechanism: Secondary | ICD-10-CM | POA: Diagnosis not present

## 2017-08-29 DIAGNOSIS — K922 Gastrointestinal hemorrhage, unspecified: Secondary | ICD-10-CM

## 2017-08-29 LAB — CBC
HEMATOCRIT: 40.6 % (ref 39.0–52.0)
HEMOGLOBIN: 14.2 g/dL (ref 13.0–17.0)
MCHC: 34.9 g/dL (ref 30.0–36.0)
MCV: 90.8 fl (ref 78.0–100.0)
Platelets: 178 10*3/uL (ref 150.0–400.0)
RBC: 4.47 Mil/uL (ref 4.22–5.81)
RDW: 12.7 % (ref 11.5–15.5)
WBC: 8.6 10*3/uL (ref 4.0–10.5)

## 2017-08-29 LAB — APTT: APTT: 32 s (ref 23.4–32.7)

## 2017-08-29 LAB — PROTIME-INR
INR: 1.1 ratio — AB (ref 0.8–1.0)
Prothrombin Time: 12.5 s (ref 9.6–13.1)

## 2017-08-30 LAB — COAG STUDIES INTERP REPORT

## 2017-08-30 LAB — VON WILLEBRAND PANEL
FACTOR VIII ACTIVITY: 103 % (ref 57–163)
VON WILLEBRAND AG: 121 % (ref 50–200)
Von Willebrand Factor: 125 % (ref 50–200)

## 2017-09-04 NOTE — Progress Notes (Signed)
Let him know that his blood clotting looks ok so do not think that was the issue with post-polypectomy bleeding.  When he was here we talked about letting Dr. Rush Landmark try his next colonoscopy since Dr. Jerilynn Mages will have skillset that should allow for better bleeding prevention than anyone here now  Go ahead and make a recall for 11/2017 - colonoscopy under my name and we will contact the patient then.  Let me know if he has any ? And I am also ccing Dr. Sandi Mariscal

## 2018-03-15 ENCOUNTER — Telehealth: Payer: Self-pay

## 2018-03-15 NOTE — Telephone Encounter (Signed)
-----   Message from Irving Copas., MD sent at 03/15/2018  1:56 PM EST ----- Regarding: RE: needs appt Dr. Rush Landmark Sounds good. Gabe ----- Message ----- From: Gatha Mayer, MD Sent: 03/15/2018   1:38 PM EST To: Marlon Pel, RN, Irving Copas., MD Subject: needs appt Dr. Rush Landmark                      Patient has hx colon polyps and has had post-polypectomy bleeds x 2 w/ 2 different endoscopists  When I saw him earlier this year I suggested we wait for Dr. Jerilynn Mages and his expertise to arrive to consider having him do the colonoscopy since his skill set with different clips etc is beyond mine.  Coag w/u was neg for bleeding diathesis  Please contact the patient and arrange an appointment aith Dr. Jerilynn Mages.  Thanks

## 2018-03-15 NOTE — Telephone Encounter (Signed)
Patient notified of the recommendations He is scheduled to see Dr. Rush Landmark on 03/20/19

## 2018-03-19 ENCOUNTER — Other Ambulatory Visit (INDEPENDENT_AMBULATORY_CARE_PROVIDER_SITE_OTHER): Payer: Medicare Other

## 2018-03-19 ENCOUNTER — Ambulatory Visit: Payer: Medicare Other | Admitting: Gastroenterology

## 2018-03-19 ENCOUNTER — Encounter: Payer: Self-pay | Admitting: Gastroenterology

## 2018-03-19 VITALS — BP 116/72 | HR 81 | Ht 74.0 in | Wt 254.0 lb

## 2018-03-19 DIAGNOSIS — K635 Polyp of colon: Secondary | ICD-10-CM

## 2018-03-19 DIAGNOSIS — Z86010 Personal history of colonic polyps: Secondary | ICD-10-CM

## 2018-03-19 DIAGNOSIS — Z8719 Personal history of other diseases of the digestive system: Secondary | ICD-10-CM | POA: Diagnosis not present

## 2018-03-19 DIAGNOSIS — D120 Benign neoplasm of cecum: Secondary | ICD-10-CM | POA: Diagnosis not present

## 2018-03-19 LAB — BASIC METABOLIC PANEL
BUN: 24 mg/dL — ABNORMAL HIGH (ref 6–23)
CO2: 31 mEq/L (ref 19–32)
Calcium: 9.9 mg/dL (ref 8.4–10.5)
Chloride: 100 mEq/L (ref 96–112)
Creatinine, Ser: 1.01 mg/dL (ref 0.40–1.50)
GFR: 77.18 mL/min (ref >60.00)
Glucose, Bld: 103 mg/dL — ABNORMAL HIGH (ref 70–99)
Potassium: 4 mEq/L (ref 3.5–5.1)
SODIUM: 138 meq/L (ref 135–145)

## 2018-03-19 LAB — PROTIME-INR
INR: 1.2 ratio — ABNORMAL HIGH (ref 0.8–1.0)
Prothrombin Time: 13.9 s — ABNORMAL HIGH (ref 9.6–13.1)

## 2018-03-19 LAB — CBC
HCT: 42.4 % (ref 39.0–52.0)
Hemoglobin: 14.7 g/dL (ref 13.0–17.0)
MCHC: 34.7 g/dL (ref 30.0–36.0)
MCV: 91.4 fl (ref 78.0–100.0)
Platelets: 218 10*3/uL (ref 150.0–400.0)
RBC: 4.64 Mil/uL (ref 4.22–5.81)
RDW: 13.1 % (ref 11.5–15.5)
WBC: 7.7 10*3/uL (ref 4.0–10.5)

## 2018-03-19 MED ORDER — PEG 3350-KCL-NA BICARB-NACL 420 G PO SOLR: 4000.0000 mL | ORAL | 0 refills | Status: DC

## 2018-03-19 NOTE — Progress Notes (Signed)
[place] VISIT   Primary Care Provider Derinda Late, MD [address] [place] [address] [phone]  Referring Provider Gatha Mayer, MD [address], [place] [address] [phone]  Patient Profile: Colton Cummings is a 72 y.o. male with a pmh significant for BPH, COPD, depression/anxiety, hypertension, hyperlipidemia, status post appendectomy, history of recurrent cecal adenomas (history of recurrent GI bleeding after polypectomies).  The patient presents to the [facility] for an evaluation and management of problem(s) noted below:  Problem List 1. Hx of adenomatous colonic polyps   2. Cecal polyp   3. History of colonic polyps   4. Hx of lower gastrointestinal bleeding twice after colonoscopic polypectomy      History of Present Illness: This is a patient who is followed by my colleague Dr. Carlean Purl whom I am asked to evaluate in the setting of consideration of colon polyp surveillance and potential need for advanced polypectomy.  The patient has had a history of cecal adenomas most recently removed back in 2018.  He has however also had cecal polyp removed previously in 2014.  Both of these have led to post polypectomy bleeding requiring recurrent colonoscopies.  The patient's last colonoscopy in 2018 showed a large cecal polyp that was removed and clipped in effort of trying to decrease his risk of bleeding.  He previously was on aspirin but that has been stopped.  He is no longer on any antiplatelet agents and he is on no anticoagulation.  The patient has not had a follow-up colonoscopy during this coming year as would normally have been the case in the setting of how large the polyp was.  Patient has no other GI complaints currently other than what is described to Dr. Carlean Purl in the past.  GI Review of Systems Positive as above Negative for abdominal pain, nausea, vomiting, jaundice, change in bowel habits,  melena, hematochezia  Review of Systems General: Denies fevers/chills/weight loss Cardiovascular: Denies chest pain Pulmonary: Denies shortness of breath Gastroenterological: See HPI Genitourinary: Denies darkened urine Hematological: Denies easy bruising/bleeding Dermatological: Denies jaundice Psychological: Mood is stable   Medications Current Outpatient Medications  Medication Sig Dispense Refill  . acetaminophen (TYLENOL) 500 MG tablet Take 500-1,000 mg by mouth every 6 (six) hours as needed for headache (pain).     . chlorthalidone (HYGROTON) 25 MG tablet Take 25 mg by mouth daily.     . cholecalciferol (VITAMIN D) 1000 units tablet Take 1,000 Units by mouth at bedtime.     . finasteride (PROSCAR) 5 MG tablet Take 5 mg by mouth daily.     . fluticasone (FLONASE) 50 MCG/ACT nasal spray Place 1 spray into both nostrils daily as needed (seasonal allergies).    Marland Kitchen ibuprofen (ADVIL,MOTRIN) 200 MG tablet Take 400-800 mg by mouth 2 (two) times daily.    Marland Kitchen lisinopril (PRINIVIL,ZESTRIL) 10 MG tablet Take 10 mg by mouth daily.     Marland Kitchen loratadine (CLARITIN) 10 MG tablet Take 10 mg by mouth daily.     . Multiple Vitamin (MULTIVITAMIN WITH MINERALS) TABS tablet Take 1 tablet by mouth daily. Men's 50+    . omeprazole (PRILOSEC) 20 MG capsule Take 20 mg by mouth daily.     Marland Kitchen orlistat (ALLI) 60 MG capsule Take 60 mg by mouth 3 (three) times daily as needed (weight loss aid).     . simvastatin (ZOCOR) 10 MG tablet Take 10 mg by mouth daily after supper.     . tamsulosin (  FLOMAX) 0.4 MG CAPS capsule Take 0.8 mg by mouth at bedtime.    . polyethylene glycol-electrolytes (NULYTELY/GOLYTELY) 420 g solution Take 4,000 mLs by mouth as directed. 4000 mL 0   No current facility-administered medications for this visit.     Allergies No Known Allergies  Histories Past Medical History:  Diagnosis Date  . Acute appendicitis 06/16/2015  . Acute lower GI bleeding after colon polypectomy 06/13/2016  .  Adenomatous colon polyp    since 1999  . Allergic rhinitis    since 2010  . Anal polyp 2013  . Arthritis 07/29/2017  . BPH (benign prostatic hyperplasia)    since 2009  . Broken ribs 07/2017   left  . Cervical spondylosis   . [place]    since 2005  . COPD (chronic obstructive pulmonary disease) ([place])    since 2010  . Cubital tunnel syndrome on left 2015  . Dental crowns present 07/29/2017  . Depression    since 2006  . ED (erectile dysfunction)    since 2005  . GERD (gastroesophageal reflux disease) 07/29/2017  . Hearing loss   . HTN (hypertension) 07/29/2017  . Hx of migraine headaches   . Hyperlipidemia 07/29/2017  . Osteoarthritis    generalized  . Vitreous detachment of right eye 2009  . Wears glasses 07/29/2017   Past Surgical History:  Procedure Laterality Date  . CATARACT EXTRACTION W/ INTRAOCULAR LENS  IMPLANT, BILATERAL    . COLONOSCOPY    . COLONOSCOPY N/A 06/12/2016   Procedure: COLONOSCOPY;  Surgeon: Manus Gunning, MD;  Location: [facility];  Service: Gastroenterology;  Laterality: N/A;  . HEMORRHOID SURGERY  01/11/2012   Internal hemorrhoid ligation  . LAPAROSCOPIC APPENDECTOMY N/A 06/16/2015   Procedure: APPENDECTOMY LAPAROSCOPIC;  Surgeon: Excell Seltzer, MD;  Location: [facility];  Service: General;  Laterality: N/A;  . RECTAL POLYPECTOMY  01/11/2012   Excision of anal canal mass.  . TONSILLECTOMY    . ULNAR NERVE TRANSPOSITION Left 03/31/2014   Procedure: DECOMPRESSION  LEFT ULNAR NERVE;  Surgeon: Daryll Brod, MD;  Location: [facility];  Service: Orthopedics;  Laterality: Left;   Social History   Socioeconomic History  . Marital status: Married    Spouse name: Not on file  . Number of children: 2  . Years of education: Not on file  . Highest education level: Not on file  Occupational History    Employer: [employer]  Social Needs  . Financial resource strain: Not on file  . Food insecurity:    Worry: Not on file     Inability: Not on file  . Transportation needs:    Medical: Not on file    Non-medical: Not on file  Tobacco Use  . Smoking status: Former Smoker    Types: Cigarettes    Last attempt to quit: 01/02/2000    Years since quitting: 18.2  . Smokeless tobacco: Never Used  Substance and Sexual Activity  . Alcohol use: Yes    Alcohol/week: 1.0 - 2.0 standard drinks    Types: 1 - 2 Standard drinks or equivalent per week  . Drug use: No  . Sexual activity: Not on file  Lifestyle  . Physical activity:    Days per week: Not on file    Minutes per session: Not on file  . Stress: Not on file  Relationships  . Social connections:    Talks on phone: Not on file    Gets together: Not on file  Attends religious service: Not on file    Active member of club or organization: Not on file    Attends meetings of clubs or organizations: Not on file    Relationship status: Not on file  . Intimate partner violence:    Fear of current or ex partner: Not on file    Emotionally abused: Not on file    Physically abused: Not on file    Forced sexual activity: Not on file  Other Topics Concern  . 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      He is married.  Lives with his wife.  Enjoys [employer] basketball      Former smoker 1-2 drinks of alcohol every 2 months quit smoking 2001 approximately 60-pack-year history   Family History  Problem Relation Age of Onset  . Heart attack Mother 91       died  . Other Mother        hx of rheumatic heart disease  . Liver disease Sister        liver failure  . Hypertension Sister   . Diabetes Sister   . Hypertension Brother   . Bipolar disorder Daughter   . Colon cancer Neg Hx   . Esophageal cancer Neg Hx   . Rectal cancer Neg Hx   . Stomach cancer Neg Hx   . Inflammatory bowel disease Neg Hx   .  Pancreatic cancer Neg Hx    I have reviewed his medical, social, and family history in detail and updated the electronic medical record as necessary.    PHYSICAL EXAMINATION  BP 116/72   Pulse 81   Ht 6\' 2"  (1.88 m)   Wt 254 lb (115.2 kg)   BMI 32.61 kg/m  Wt Readings from Last 3 Encounters:  03/19/18 254 lb (115.2 kg)  08/16/17 259 lb (117.5 kg)  07/28/17 260 lb (117.9 kg)   GEN: NAD, appears stated age, doesn't appear chronically ill PSYCH: Cooperative, without pressured speech EYE: Conjunctivae pink, sclerae anicteric ENT: MMM, without oral ulcers, no erythema or exudates noted NECK: Supple CV: RR without R/Gs  RESP: CTAB posteriorly, without wheezing GI: NABS, soft, NT/ND, without rebound or guarding, no HSM appreciated GU: DRE shows MSK/EXT: _ edema, no palmar erythema SKIN: No jaundice, no spider angiomata, no concerning rashes NEURO:  Alert & Oriented x 3, no focal deficits, no evidence of asterixis   REVIEW OF DATA  I reviewed the following data at the time of this encounter:  GI Procedures and Studies  2018 Colonoscopy '- One 25 mm polyp in the cecum, removed using injection-lift and a hot snare. Resected and retrieved. Clips (MR conditional) were placed. - One 10 mm polyp in the cecum, removed with a hot snare. Resected and retrieved. - Two 3 to 7 mm polyps in the transverse colon, removed with a cold snare. Resected and retrieved. - External and internal hemorrhoids. - The examination was otherwise normal on direct and retroflexion views. - Personal history of colonic polyps. cecal/ascending ssp 2014  Laboratory Studies  Reviewed in epic  Imaging Studies  No relevant studies to review   ASSESSMENT  Mr. Amison is a 72 y.o. male with a pmh significant for BPH, COPD, depression/anxiety, hypertension, hyperlipidemia, status post appendectomy, history of recurrent cecal adenomas (history of recurrent GI bleeding after polypectomies).  The  patient is seen today  for evaluation and management of:  1. Hx of adenomatous colonic polyps   2. Cecal polyp   3. History of colonic polyps   4. Hx of lower gastrointestinal bleeding twice after colonoscopic polypectomy     The patient has a history of recurrent adenoma within the cecum as well as a secondary new cecal adenoma that was removed in 2018.  The patient requires a surveillance follow-up however he has had 2 significant GI bleeds post polypectomies even with clipping being placed in effort of trying to decrease the risk.  The etiology of his recurrent bleeding diastases not completely clear and he reports being told that he does not have any bleeding disorders.  I think it is reasonable for [place] to consider repeat evaluation I would plan to do this in the hospital-based setting.  If we end up finding that he has no recurrent and no move that would be great and he will need any further evaluation for at least 3 years to 5 years.  However if the patient does have recurrent adenoma continued resection endoscopically would be ideal.  We could consider the use of Endo Roeder with epinephrine injection to try and decrease the risk of bleeding post procedure if he had recurrent adenoma in the region otherwise we can use normal techniques for advanced polypectomy including mucosal resection.  The risks and benefits of endoscopic evaluation were discussed with the patient; these include but are not limited to the risk of perforation, infection, bleeding, missed lesions, lack of diagnosis, severe illness requiring hospitalization, as well as anesthesia and sedation related illnesses.  The patient is agreeable to proceed and we will schedule the patient for colonoscopy in the hospital based outpatient unit in the coming weeks.   PLAN  Obtain labs as noted below within 1 month prior to colonoscopy Proceed with colonoscopy for evaluation of any recurrent adenoma and resection techniques as necessary as well as closure at that  time to decrease risk of recurrent bleeding  Orders Placed This Encounter  Procedures  . CBC  . Basic metabolic panel  . Protime-INR  . Ambulatory referral to Gastroenterology    New Prescriptions   POLYETHYLENE GLYCOL-ELECTROLYTES (NULYTELY/GOLYTELY) 420 G SOLUTION    Take 4,000 mLs by mouth as directed.   Modified Medications   No medications on file    Planned Follow Up: No follow-ups on file.   Justice Britain, MD [facility] Advanced [facility]oscopy Office # [phone]

## 2018-03-19 NOTE — Patient Instructions (Signed)
You have been scheduled for a colonoscopy. Please follow written instructions given to you at your visit today.  Please pick up your prep supplies at the pharmacy within the next 1-3 days. If you use inhalers (even only as needed), please bring them with you on the day of your procedure. Your physician has requested that you go to www.startemmi.com and enter the access code given to you at your visit today. This web site gives a general overview about your procedure. However, you should still follow specific instructions given to you by our office regarding your preparation for the procedure.   Your provider has requested that you go to the basement level for lab work before leaving today. Press "B" on the elevator. The lab is located at the first door on the left as you exit the elevator.  Thank you for entrusting me with your care and choosing [facility].  Dr Rush Landmark

## 2018-03-22 ENCOUNTER — Encounter: Payer: Self-pay | Admitting: Gastroenterology

## 2018-03-22 DIAGNOSIS — K635 Polyp of colon: Secondary | ICD-10-CM | POA: Insufficient documentation

## 2018-03-22 DIAGNOSIS — D120 Benign neoplasm of cecum: Secondary | ICD-10-CM

## 2018-04-10 DIAGNOSIS — M069 Rheumatoid arthritis, unspecified: Secondary | ICD-10-CM

## 2018-04-10 HISTORY — DX: Rheumatoid arthritis, unspecified: M06.9

## 2018-04-26 ENCOUNTER — Encounter (HOSPITAL_COMMUNITY): Payer: Self-pay | Admitting: *Deleted

## 2018-04-26 ENCOUNTER — Other Ambulatory Visit: Payer: Self-pay

## 2018-04-26 NOTE — Progress Notes (Signed)
Spoke with pt for pre-op call. Pt denies cardiac history or diabetes.  EKG - 07/28/17 CXR - 07/30/17 Echo - 12/13/13

## 2018-04-29 ENCOUNTER — Ambulatory Visit (HOSPITAL_COMMUNITY): Payer: Medicare Other | Admitting: Anesthesiology

## 2018-04-29 ENCOUNTER — Encounter (HOSPITAL_COMMUNITY): Payer: Self-pay | Admitting: Gastroenterology

## 2018-04-29 ENCOUNTER — Ambulatory Visit (HOSPITAL_COMMUNITY)
Admission: RE | Admit: 2018-04-29 | Discharge: 2018-04-29 | Disposition: A | Discharge status: Home / Self Care | Payer: Medicare Other | Attending: Gastroenterology | Admitting: Gastroenterology

## 2018-04-29 ENCOUNTER — Encounter (HOSPITAL_COMMUNITY)
Admission: RE | Disposition: A | Discharge status: Home / Self Care | Payer: Self-pay | Source: Home / Self Care | Attending: Gastroenterology

## 2018-04-29 DIAGNOSIS — K642 Third degree hemorrhoids: Secondary | ICD-10-CM | POA: Diagnosis not present

## 2018-04-29 DIAGNOSIS — Z87891 Personal history of nicotine dependence: Secondary | ICD-10-CM | POA: Insufficient documentation

## 2018-04-29 DIAGNOSIS — D125 Benign neoplasm of sigmoid colon: Secondary | ICD-10-CM | POA: Diagnosis not present

## 2018-04-29 DIAGNOSIS — F329 Major depressive disorder, single episode, unspecified: Secondary | ICD-10-CM | POA: Insufficient documentation

## 2018-04-29 DIAGNOSIS — J449 Chronic obstructive pulmonary disease, unspecified: Secondary | ICD-10-CM | POA: Diagnosis not present

## 2018-04-29 DIAGNOSIS — M1990 Unspecified osteoarthritis, unspecified site: Secondary | ICD-10-CM | POA: Insufficient documentation

## 2018-04-29 DIAGNOSIS — D123 Benign neoplasm of transverse colon: Secondary | ICD-10-CM | POA: Diagnosis not present

## 2018-04-29 DIAGNOSIS — N400 Enlarged prostate without lower urinary tract symptoms: Secondary | ICD-10-CM | POA: Diagnosis not present

## 2018-04-29 DIAGNOSIS — Z86010 Personal history of colonic polyps: Secondary | ICD-10-CM | POA: Insufficient documentation

## 2018-04-29 DIAGNOSIS — Z1211 Encounter for screening for malignant neoplasm of colon: Secondary | ICD-10-CM | POA: Diagnosis present

## 2018-04-29 DIAGNOSIS — I10 Essential (primary) hypertension: Secondary | ICD-10-CM | POA: Insufficient documentation

## 2018-04-29 DIAGNOSIS — Z79899 Other long term (current) drug therapy: Secondary | ICD-10-CM | POA: Insufficient documentation

## 2018-04-29 DIAGNOSIS — K219 Gastro-esophageal reflux disease without esophagitis: Secondary | ICD-10-CM | POA: Diagnosis not present

## 2018-04-29 HISTORY — PX: COLONOSCOPY WITH PROPOFOL: SHX5780

## 2018-04-29 HISTORY — DX: Headache, unspecified: R51.9

## 2018-04-29 HISTORY — PX: POLYPECTOMY: SHX5525

## 2018-04-29 HISTORY — DX: Headache: R51

## 2018-04-29 HISTORY — DX: Anemia, unspecified: D64.9

## 2018-04-29 SURGERY — COLONOSCOPY WITH PROPOFOL
Anesthesia: Monitor Anesthesia Care

## 2018-04-29 MED ORDER — LACTATED RINGERS IV SOLN
INTRAVENOUS | Status: DC
  Administered 2018-04-29: 08:00:00 via INTRAVENOUS

## 2018-04-29 MED ORDER — SODIUM CHLORIDE 0.9 % IV SOLN: INTRAVENOUS | Status: DC

## 2018-04-29 MED ORDER — PROPOFOL 10 MG/ML IV BOLUS
INTRAVENOUS | Status: DC | PRN
  Administered 2018-04-29: 30 mg via INTRAVENOUS
  Administered 2018-04-29 (×2): 20 mg via INTRAVENOUS

## 2018-04-29 MED ORDER — PROPOFOL 500 MG/50ML IV EMUL
INTRAVENOUS | Status: DC | PRN
  Administered 2018-04-29: 100 ug/kg/min via INTRAVENOUS

## 2018-04-29 SURGICAL SUPPLY — 22 items

## 2018-04-29 NOTE — Anesthesia Procedure Notes (Signed)
Procedure Name: MAC Date/Time: 04/29/2018 9:18 AM Performed by: Raenette Rover, CRNA Oxygen Delivery Method: Nasal cannula Placement Confirmation: positive ETCO2

## 2018-04-29 NOTE — Anesthesia Postprocedure Evaluation (Signed)
Anesthesia Post Note  Patient: Colton Cummings  Procedure(s) Performed: COLONOSCOPY WITH PROPOFOL (N/A ) POLYPECTOMY HEMOSTASIS CLIP PLACEMENT SUBMUCOSAL LIFTING INJECTION     Patient location during evaluation: PACU Anesthesia Type: MAC Level of consciousness: awake and alert Pain management: pain level controlled Vital Signs Assessment: post-procedure vital signs reviewed and stable Respiratory status: spontaneous breathing, nonlabored ventilation, respiratory function stable and patient connected to nasal cannula oxygen Cardiovascular status: stable and blood pressure returned to baseline Postop Assessment: no apparent nausea or vomiting Anesthetic complications: no    Last Vitals:  Vitals:   04/29/18 1030 04/29/18 1040  BP: 123/74 133/63  Pulse: 66 65  Resp: 13 13  Temp:    SpO2: 94% 95%    Last Pain:  Vitals:   04/29/18 1040  TempSrc:   PainSc: 0-No pain                 [name]

## 2018-04-29 NOTE — Discharge Instructions (Signed)
YOU HAD AN ENDOSCOPIC PROCEDURE TODAY: Refer to the procedure report and other information in the discharge instructions given to you for any specific questions about what was found during the examination. If this information does not answer your questions, please call [facility] office at [phone] to clarify.  ° °YOU SHOULD EXPECT: Some feelings of bloating in the abdomen. Passage of more gas than usual. Walking can help get rid of the air that was put into your GI tract during the procedure and reduce the bloating. If you had a lower endoscopy (such as a colonoscopy or flexible sigmoidoscopy) you may notice spotting of blood in your stool or on the toilet paper. Some abdominal soreness may be present for a day or two, also. ° °DIET: Your first meal following the procedure should be a light meal and then it is ok to progress to your normal diet. A half-sandwich or bowl of soup is an example of a good first meal. Heavy or fried foods are harder to digest and may make you feel nauseous or bloated. Drink plenty of fluids but you should avoid alcoholic beverages for 24 hours. If you had a esophageal dilation, please see attached instructions for diet.   ° °ACTIVITY: Your care partner should take you home directly after the procedure. You should plan to take it easy, moving slowly for the rest of the day. You can resume normal activity the day after the procedure however YOU SHOULD NOT DRIVE, use power tools, machinery or perform tasks that involve climbing or major physical exertion for 24 hours (because of the sedation medicines used during the test).  ° °SYMPTOMS TO REPORT IMMEDIATELY: °A gastroenterologist can be reached at any hour. Please call [phone]  for any of the following symptoms:  °Following lower endoscopy (colonoscopy, flexible sigmoidoscopy) °Excessive amounts of blood in the stool  °Significant tenderness, worsening of abdominal pains  °Swelling of the abdomen that is new, acute  °Fever of 100° or  higher  °Following upper endoscopy (EGD, EUS, ERCP, esophageal dilation) °Vomiting of blood or coffee ground material  °New, significant abdominal pain  °New, significant chest pain or pain under the shoulder blades  °Painful or persistently difficult swallowing  °New shortness of breath  °Black, tarry-looking or red, bloody stools ° °FOLLOW UP:  °If any biopsies were taken you will be contacted by phone or by letter within the next 1-3 weeks. Call [phone]  if you have not heard about the biopsies in 3 weeks.  °Please also call with any specific questions about appointments or follow up tests. ° °

## 2018-04-29 NOTE — Transfer of Care (Signed)
Immediate Anesthesia Transfer of Care Note  Patient: Colton Cummings  Procedure(s) Performed: COLONOSCOPY WITH PROPOFOL (N/A ) POLYPECTOMY HEMOSTASIS CLIP PLACEMENT SUBMUCOSAL LIFTING INJECTION  Patient Location: PACU  Anesthesia Type:MAC  Level of Consciousness: awake, alert , oriented and patient cooperative  Airway & Oxygen Therapy: Patient Spontanous Breathing and Patient connected to nasal cannula oxygen  Post-op Assessment: Report given to RN and Post -op Vital signs reviewed and stable  Post vital signs: Reviewed and stable  Last Vitals:  Vitals Value Taken Time  BP    Temp    Pulse 73 04/29/2018 10:22 AM  Resp 18 04/29/2018 10:22 AM  SpO2 98 % 04/29/2018 10:22 AM    Last Pain:  Vitals:   04/29/18 1022  TempSrc: Oral  PainSc:          Complications: No apparent anesthesia complications

## 2018-04-29 NOTE — Anesthesia Preprocedure Evaluation (Signed)
Anesthesia Evaluation  Patient identified by MRN, date of birth, ID band Patient awake    Reviewed: Allergy & Precautions, NPO status , Patient's Chart, lab work & pertinent test results  Airway Mallampati: II  TM Distance: >3 FB Neck ROM: Full    Dental no notable dental hx.    Pulmonary COPD, former smoker,    Pulmonary exam normal breath sounds clear to auscultation       Cardiovascular hypertension, Pt. on medications Normal cardiovascular exam Rhythm:Regular Rate:Normal     Neuro/Psych negative neurological ROS  negative psych ROS   GI/Hepatic negative GI ROS, Neg liver ROS,   Endo/Other  negative endocrine ROS  Renal/GU negative Renal ROS  negative genitourinary   Musculoskeletal negative musculoskeletal ROS (+)   Abdominal   Peds negative pediatric ROS (+)  Hematology negative hematology ROS (+)   Anesthesia Other Findings   Reproductive/Obstetrics negative OB ROS                             Anesthesia Physical Anesthesia Plan  ASA: II  Anesthesia Plan: MAC   Post-op Pain Management:    Induction:   PONV Risk Score and Plan: 1 and Treatment may vary due to age or medical condition  Airway Management Planned: Nasal Cannula and Simple Face Mask  Additional Equipment:   Intra-op Plan:   Post-operative Plan:   Informed Consent: I have reviewed the patients History and Physical, chart, labs and discussed the procedure including the risks, benefits and alternatives for the proposed anesthesia with the patient or authorized representative who has indicated his/her understanding and acceptance.     Dental advisory given  Plan Discussed with: CRNA  Anesthesia Plan Comments:         Anesthesia Quick Evaluation

## 2018-04-29 NOTE — Op Note (Signed)
[facility] Patient Name: Colton Cummings Procedure Date : 04/29/2018 MRN: [id number] Attending MD: Justice Britain , MD Date of Birth: 1945/09/18 CSN: [id number] Age: 73 Admit Type: Outpatient Procedure:                Colonoscopy Indications:              High risk colon cancer surveillance: Personal                            history of colonic polyps, History of Cecal polyps                            x2 with EMR resections and bleeding post-procedure Providers:                Justice Britain, MD, Dorise Hiss, RN, Carlyn Reichert, RN, Carver Fila Referring MD:             Gatha Mayer, MD, Derinda Late Medicines:                Monitored Anesthesia Care Complications:            No immediate complications. Estimated Blood Loss:     Estimated blood loss was minimal. Procedure:                Pre-Anesthesia Assessment:                           - Prior to the procedure, a History and Physical                            was performed, and patient medications and                            allergies were reviewed. The patient's tolerance of                            previous anesthesia was also reviewed. The risks                            and benefits of the procedure and the sedation                            options and risks were discussed with the patient.                            All questions were answered, and informed consent                            was obtained. Prior Anticoagulants: The patient has                            taken no previous anticoagulant or antiplatelet  agents. ASA Grade Assessment: III - A patient with                            severe systemic disease. After reviewing the risks                            and benefits, the patient was deemed in                            satisfactory condition to undergo the procedure.                           After obtaining informed  consent, the colonoscope                            was passed under direct vision. Throughout the                            procedure, the patient's blood pressure, pulse, and                            oxygen saturations were monitored continuously. The                            CF-HQ190L ([id number]) Olympus colonoscope was                            introduced through the anus and advanced to the 5                            cm into the ileum. The colonoscopy was performed                            without difficulty. The patient tolerated the                            procedure. The quality of the bowel preparation was                            evaluated using the BBPS [facility] Bowel Preparation                            Scale) with scores of: Right Colon = 2 (minor                            amount of residual staining, small fragments of                            stool and/or opaque liquid, but mucosa seen well),                            Transverse Colon = 3 (entire mucosa seen well with  no residual staining, small fragments of stool or                            opaque liquid) and Left Colon = 3 (entire mucosa                            seen well with no residual staining, small                            fragments of stool or opaque liquid). The total                            BBPS score equals 8. The quality of the bowel                            preparation was good. Scope In: 9:26:01 AM Scope Out: 10:15:33 AM Scope Withdrawal Time: 0 hours 42 minutes 34 seconds  Total Procedure Duration: 0 hours 49 minutes 32 seconds  Findings:      The digital rectal exam findings include non-thrombosed internal       hemorrhoids. Pertinent negatives include no palpable rectal lesions.      The terminal ileum and ileocecal valve appeared normal.      A medium post mucosectomy scar was found in the cecum. There was no       evidence of the previous polyp.       A medium post mucosectomy scar was found in the cecum. There was       residual polypoid tissue. The polypoid tissue was removed with a cold       snare. Resection and retrieval were complete. To prevent bleeding       post-maneuver, four hemostatic clips were successfully placed (MR       conditional). There was no bleeding at the end of the procedure.      A 8 mm polyp was found in the hepatic flexure. The polyp was sessile.       The polyp was removed with a hot snare. Resection and retrieval were       complete. To prevent bleeding after the polypectomy, two hemostatic       clips were successfully placed (MR conditional). There was no bleeding       during, or at the end, of the procedure.      A 14 mm polyp was found in the splenic flexure. The polyp was sessile.       Preparations were made for mucosal resection. Saline was injected to       raise the lesion. Snare mucosal resection was performed. Resection and       retrieval were complete. To close a defect after mucosal resection,       three hemostatic clips were successfully placed (MR conditional). There       was no bleeding during, or at the end, of the procedure.      A 6 mm polyp was found in the sigmoid colon. The polyp was sessile. The       polyp was removed with a cold snare. Resection and retrieval were       complete. To prevent bleeding after the polypectomy, one hemostatic clip       was successfully  placed (MR conditional). There was no bleeding at the       end of the procedure.      Normal mucosa was found in the entire colon otherwise.      Non-bleeding non-thrombosed internal hemorrhoids were found during       retroflexion, during perianal exam and during digital exam. The       hemorrhoids were Grade III (internal hemorrhoids that prolapse but       require manual reduction). Impression:               - Non-thrombosed internal hemorrhoids found on                            digital rectal exam.                            - The examined portion of the ileum was normal.                           - Post mucosectomy scar in the cecum.                           - Second, post mucosectomy scar in the cecum with                            evidence of polypoid tissue present. Removed. Clips                            (MR conditional) were placed.                           - One 8 mm polyp at the hepatic flexure, removed                            with a hot snare. Resected and retrieved. Clips (MR                            conditional) were placed.                           - One 14 mm polyp at the splenic flexure, removed                            with mucosal resection. Resected and retrieved.                            Clips (MR conditional) were placed.                           - One 6 mm polyp in the sigmoid colon, removed with                            a cold snare. Resected and retrieved. Clip (MR  conditional) was placed.                           - Normal mucosa in the entire examined colon                            otherwise.                           - Non-bleeding non-thrombosed internal hemorrhoids. Recommendation:           - The patient will be observed post-procedure,                            until all discharge criteria are met.                           - Discharge patient to home.                           - Patient has a contact number available for                            emergencies. The signs and symptoms of potential                            delayed complications were discussed with the                            patient. Return to normal activities tomorrow.                            Written discharge instructions were provided to the                            patient.                           - High fiber diet.                           - Use fiber, for example Citrucel, Fibercon, Konsyl                            or Metamucil.                            - No aspirin, ibuprofen, naproxen, or other                            non-steroidal anti-inflammatory drugs for 1 week                            after polyp removal.                           - Await pathology results.                           -  Repeat colonoscopy in 1-3 years for surveillance                            based on pathology results. If the polypoid tissue                            in the cecum returns as adenoma, would repeat                            colonoscopy in 1-year to re-evaluate the region, if                            not, then 3-years for follow up would be                            recommended. The follow up procedure can be done by                            Dr. Carlean Purl or myself based on availability.                           - The findings and recommendations were discussed                            with the patient.                           - The findings and recommendations were discussed                            with the patient's family. Procedure Code(s):        --- Professional ---                           [phone], 2, Colonoscopy, flexible; with endoscopic                            mucosal resection                           [phone], Colonoscopy, flexible; with removal of                            tumor(s), polyp(s), or other lesion(s) by snare                            technique Diagnosis Code(s):        --- Professional ---                           Z86.010, Personal history of colonic polyps                           K64.2, Third degree hemorrhoids  Z79.150, Other specified postprocedural states                           D12.3, Benign neoplasm of transverse colon (hepatic                            flexure or splenic flexure)                           D12.5, Benign neoplasm of sigmoid colon CPT copyright 2018 American Medical Association. All rights reserved. The codes documented in this  report are preliminary and upon coder review may  be revised to meet current compliance requirements. Justice Britain, MD 04/29/2018 10:32:56 AM Number of Addenda: 0

## 2018-04-29 NOTE — H&P (Signed)
GASTROENTEROLOGY OUTPATIENT PROCEDURE H&P NOTE   Primary Care Physician: Derinda Late, MD  HPI: Colton Cummings is a 73 y.o. male who presents for Colonoscopy.  Past Medical History:  Diagnosis Date  . Acute appendicitis 06/16/2015  . Acute lower GI bleeding after colon polypectomy 06/13/2016  . Adenomatous colon polyp    since 1999  . Allergic rhinitis    since 2010  . Anal polyp 2013  . Anemia    due to bleeding after colonoscopy  . Arthritis 07/29/2017  . BPH (benign prostatic hyperplasia)    since 2009  . Broken ribs 07/2017   left  . Cervical spondylosis   . [place]ordee    since 2005  . COPD (chronic obstructive pulmonary disease) ([place])    since 2010  . Cubital tunnel syndrome on left 2015  . Dental crowns present 07/29/2017  . Depression    since 2006  . ED (erectile dysfunction)    since 2005  . GERD (gastroesophageal reflux disease) 07/29/2017  . Headache    migraines  . Hearing loss   . HTN (hypertension) 07/29/2017  . Hx of migraine headaches   . Hyperlipidemia 07/29/2017  . Osteoarthritis    generalized  . Vitreous detachment of right eye 2009  . Wears glasses 07/29/2017   Past Surgical History:  Procedure Laterality Date  . CATARACT EXTRACTION W/ INTRAOCULAR LENS  IMPLANT, BILATERAL    . COLONOSCOPY    . COLONOSCOPY N/A 06/12/2016   Procedure: COLONOSCOPY;  Surgeon: Manus Gunning, MD;  Location: [facility];  Service: Gastroenterology;  Laterality: N/A;  . HEMORRHOID SURGERY  01/11/2012   Internal hemorrhoid ligation  . LAPAROSCOPIC APPENDECTOMY N/A 06/16/2015   Procedure: APPENDECTOMY LAPAROSCOPIC;  Surgeon: Excell Seltzer, MD;  Location: [facility];  Service: General;  Laterality: N/A;  . RECTAL POLYPECTOMY  01/11/2012   Excision of anal canal mass.  . TONSILLECTOMY    . ULNAR NERVE TRANSPOSITION Left 03/31/2014   Procedure: DECOMPRESSION  LEFT ULNAR NERVE;  Surgeon: Daryll Brod, MD;  Location: [facility];  Service:  Orthopedics;  Laterality: Left;   Current Facility-Administered Medications  Medication Dose Route Frequency Provider Last Rate Last Dose  . 0.9 %  sodium chloride infusion   Intravenous Continuous Mansouraty, Telford Nab., MD       No Known Allergies Family History  Problem Relation Age of Onset  . Heart attack Mother 87       died  . Other Mother        hx of rheumatic heart disease  . Liver disease Sister        liver failure  . Hypertension Sister   . Diabetes Sister   . Hypertension Brother   . Bipolar disorder Daughter   . Colon cancer Neg Hx   . Esophageal cancer Neg Hx   . Rectal cancer Neg Hx   . Stomach cancer Neg Hx   . Inflammatory bowel disease Neg Hx   . Pancreatic cancer Neg Hx    Social History   Socioeconomic History  . Marital status: Married    Spouse name: Not on file  . Number of children: 2  . Years of education: Not on file  . Highest education level: Not on file  Occupational History    Employer: [employer]  Social Needs  . Financial resource strain: Not on file  . Food insecurity:    Worry: Not on file    Inability: Not on file  .  Transportation needs:    Medical: Not on file    Non-medical: Not on file  Tobacco Use  . Smoking status: Former Smoker    Types: Cigarettes    Last attempt to quit: 01/02/2000    Years since quitting: 18.3  . Smokeless tobacco: Never Used  Substance and Sexual Activity  . Alcohol use: Yes    Alcohol/week: 1.0 - 2.0 standard drinks    Types: 1 - 2 Standard drinks or equivalent per week  . Drug use: No  . Sexual activity: Not on file  Lifestyle  . Physical activity:    Days per week: Not on file    Minutes per session: Not on file  . Stress: Not on file  Relationships  . Social connections:    Talks on phone: Not on file    Gets together: Not on file    Attends religious service: Not on file    Active member of club or organization: Not on file    Attends meetings of clubs or organizations: Not  on file    Relationship status: Not on file  . Intimate partner violence:    Fear of current or ex partner: Not on file    Emotionally abused: Not on file    Physically abused: Not on file    Forced sexual activity: Not on file  Other Topics Concern  . 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      He is married.  Lives with his wife.  Enjoys [employer] basketball      Former smoker 1-2 drinks of alcohol every 2 months quit smoking 2001 approximately 60-pack-year history    Physical Exam: Vital signs in last 24 hours:     GEN: NAD EYE: Sclerae anicteric ENT: MMM CV: RR without R/Gs  RESP: CTAB posteriorly GI: Soft, NT/ND NEURO:  Alert & Oriented x 3  Lab Results: No results for input(s): WBC, HGB, HCT, PLT in the last 72 hours. BMET No results for input(s): NA, K, CL, CO2, GLUCOSE, BUN, CREATININE, CALCIUM in the last 72 hours. LFT No results for input(s): PROT, ALBUMIN, AST, ALT, ALKPHOS, BILITOT, BILIDIR, IBILI in the last 72 hours. PT/INR No results for input(s): LABPROT, INR in the last 72 hours.   Impression / Plan: This is a 73 y.o.male who presents for Colonoscopy.  The risks and benefits of endoscopic evaluation were discussed with the patient; these include but are not limited to the risk of perforation, infection, bleeding, missed lesions, lack of diagnosis, severe illness requiring hospitalization, as well as anesthesia and sedation related illnesses.  The patient is agreeable to proceed.    Justice Britain, MD [facility] Office # [phone]'

## 2018-04-30 ENCOUNTER — Encounter: Payer: Self-pay | Admitting: Gastroenterology

## 2018-05-01 ENCOUNTER — Encounter (HOSPITAL_COMMUNITY): Payer: Self-pay | Admitting: Gastroenterology

## 2019-01-12 IMAGING — CR DG CHEST 2V
2 series · 2 of 2 positions shown · non-contrast
Comparison: 06/16/2015

CLINICAL DATA: Shortness of breath

EXAM:
CHEST - 2 VIEW

[chest pa]
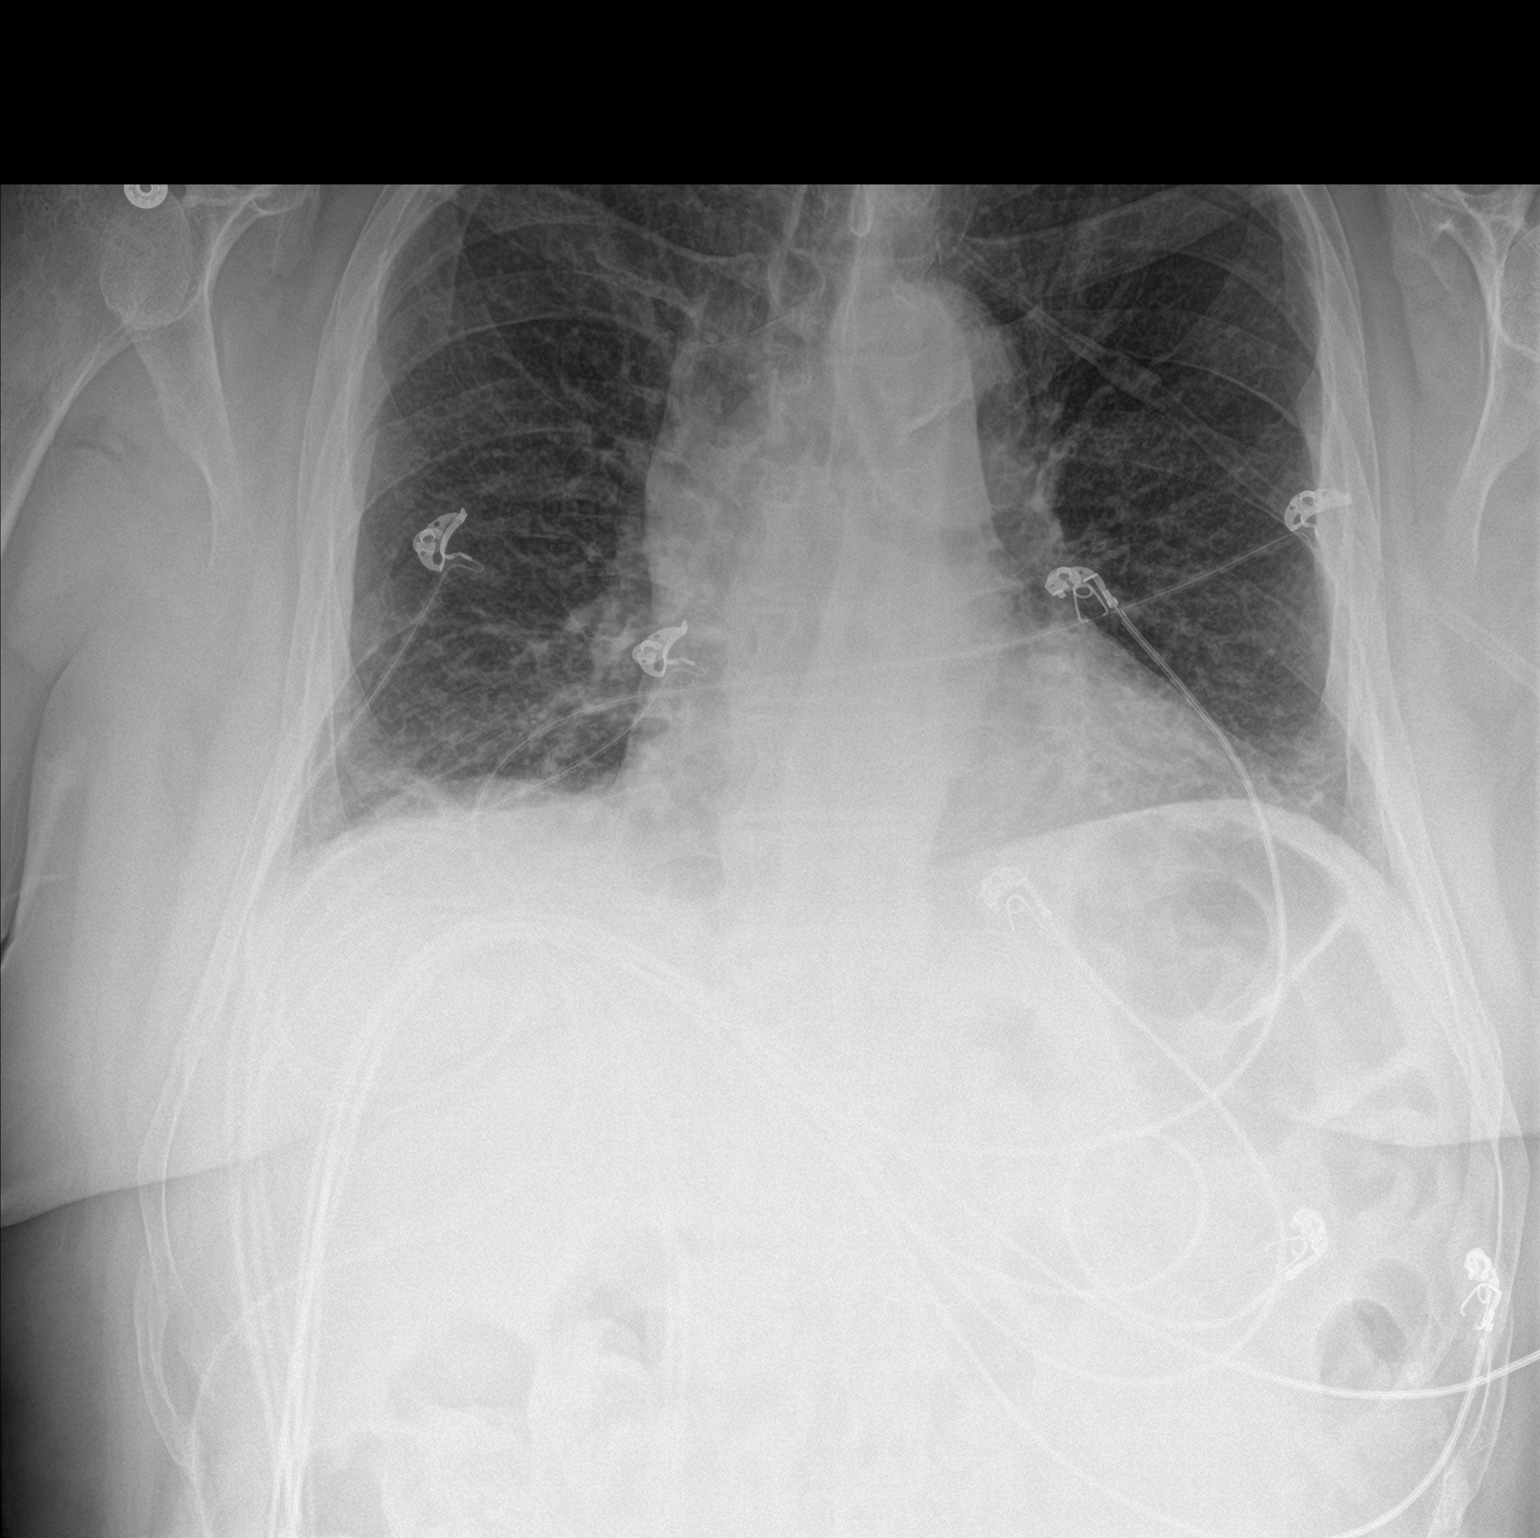

[chest lat]
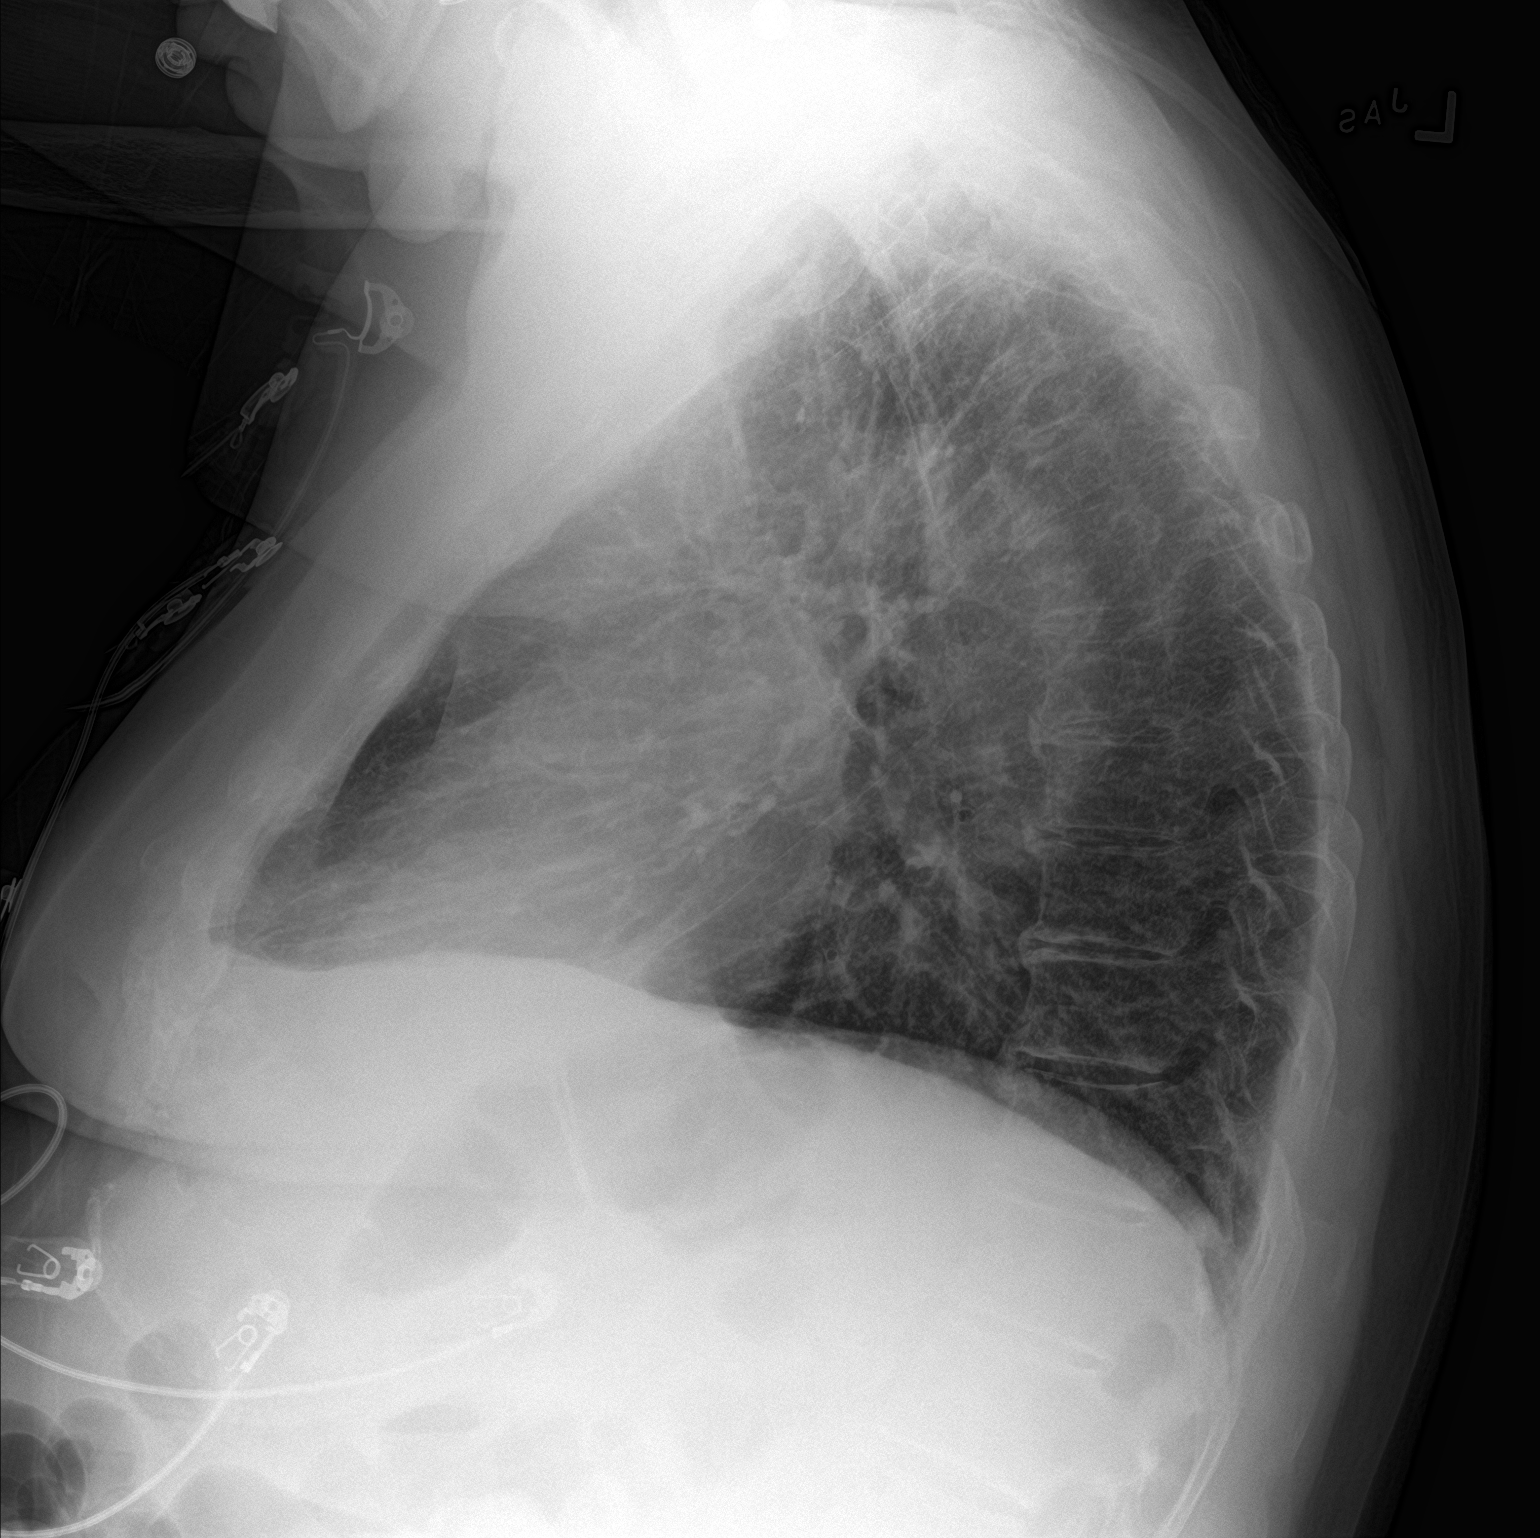

[2 of 2 positions shown; findings below may reference images not displayed]

FINDINGS: Hyperinflation. No pleural effusion. Minimal atelectasis at the
bases. Borderline to mild cardiomegaly with aortic atherosclerosis.
No pneumothorax. Mild degenerative changes of the spine.
IMPRESSION: No active cardiopulmonary disease. Mild subsegmental atelectasis at
the bases.

## 2019-01-12 IMAGING — CT CT CHEST W/ CM
2 of 5 series · 13 of 36 positions shown, 16 images · IV contrast (APPLIED)
Comparison: CT of the chest performed 04/16/2015, and CT of the
abdomen and pelvis from 06/16/2015

CLINICAL DATA: Acute onset of shortness of breath after fall on
left side at work. Upper abdominal swelling. Left rib tenderness.

EXAM:
CT CHEST, ABDOMEN, AND PELVIS WITH CONTRAST
TECHNIQUE: Multidetector CT imaging of the chest, abdomen and pelvis was
performed following the standard protocol during bolus
administration of intravenous contrast.
CONTRAST:  100mL NPGOJS-LCC IOPAMIDOL (NPGOJS-LCC) INJECTION 61%

[Series 3: cap 5.0 i31f 2 · axial · 0.98mm/px · z∈[+695,+1265]mm · 10 of 138 slices shown, 13 images]
[im 12/138  mediastinal]
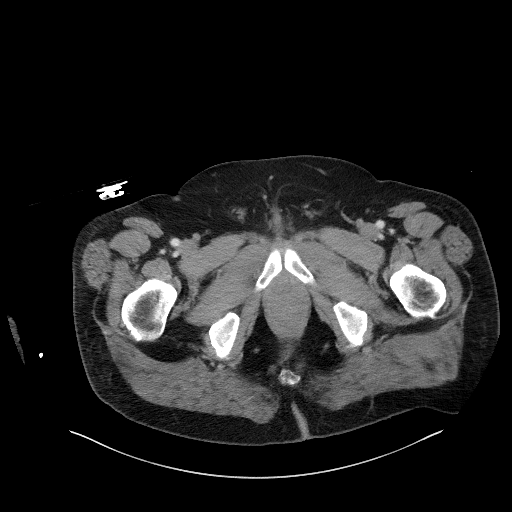
[im 12/138  lung]
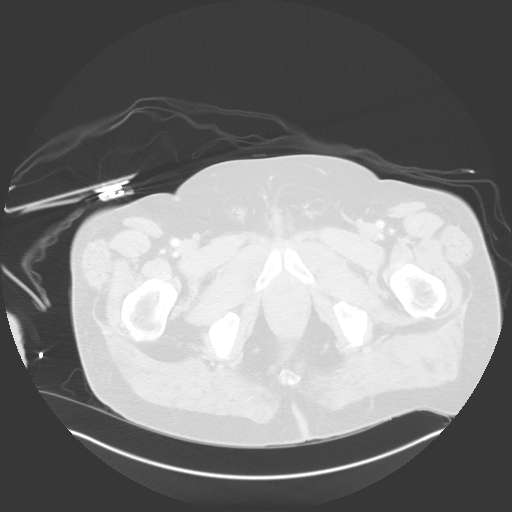
[im 23/138  lung]
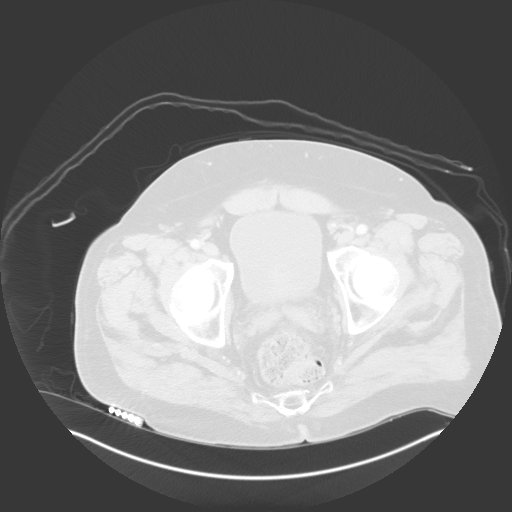
[im 35/138  lung]
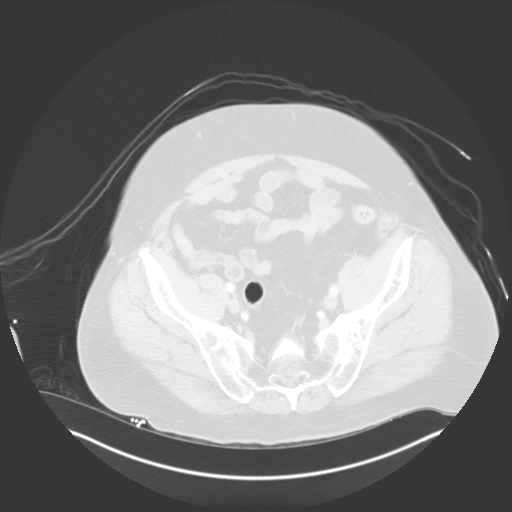
[im 46/138  lung]
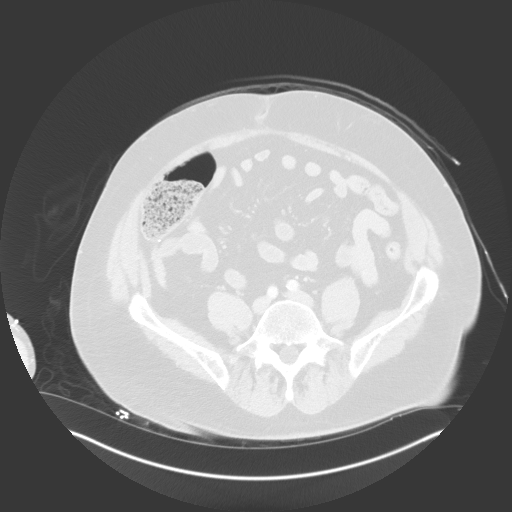
[im 58/138  mediastinal]
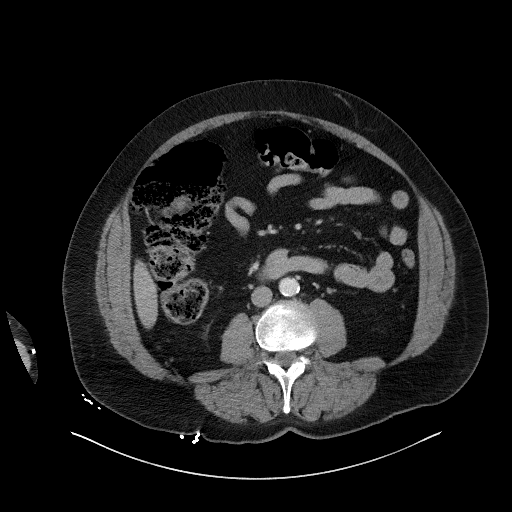
[im 58/138  lung]
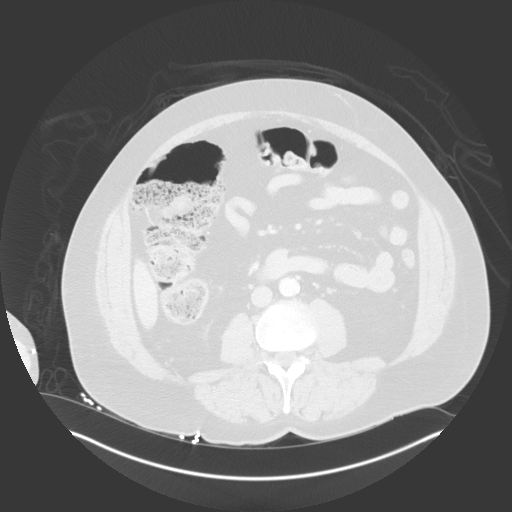
[im 80/138  lung]
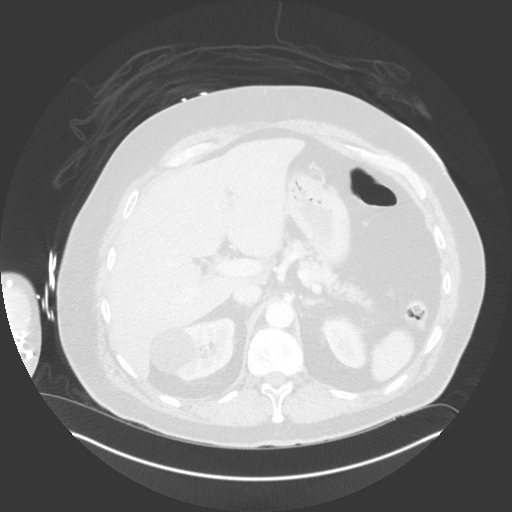
[im 92/138  lung]
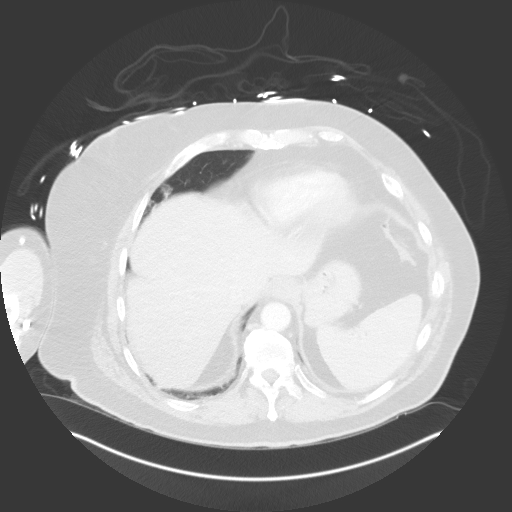
[im 103/138  lung]
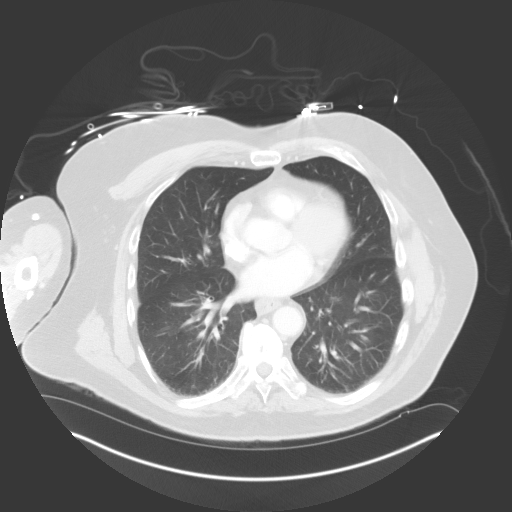
[im 115/138  mediastinal]
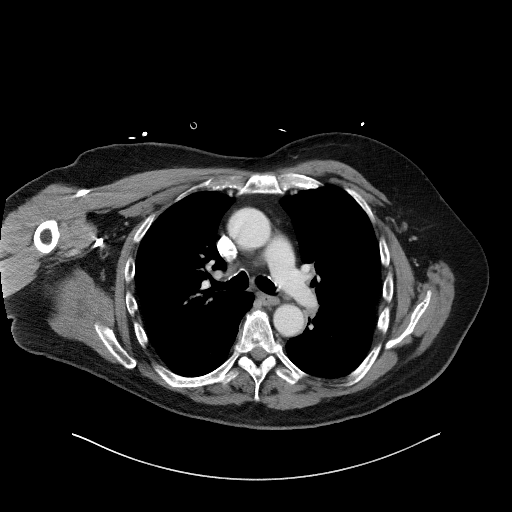
[im 115/138  lung]
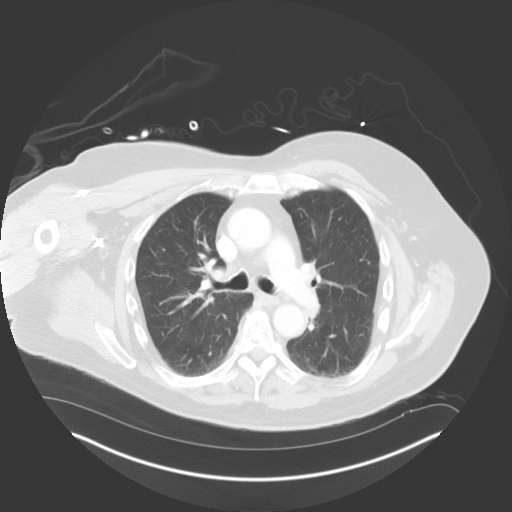
[im 126/138  lung]
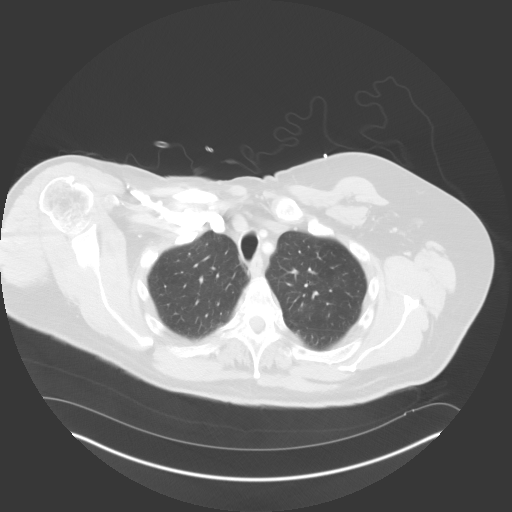

[Series 6: coronal · coronal · 0.82mm/px · 3 of 175 slices shown]
[im 35/175  lung]
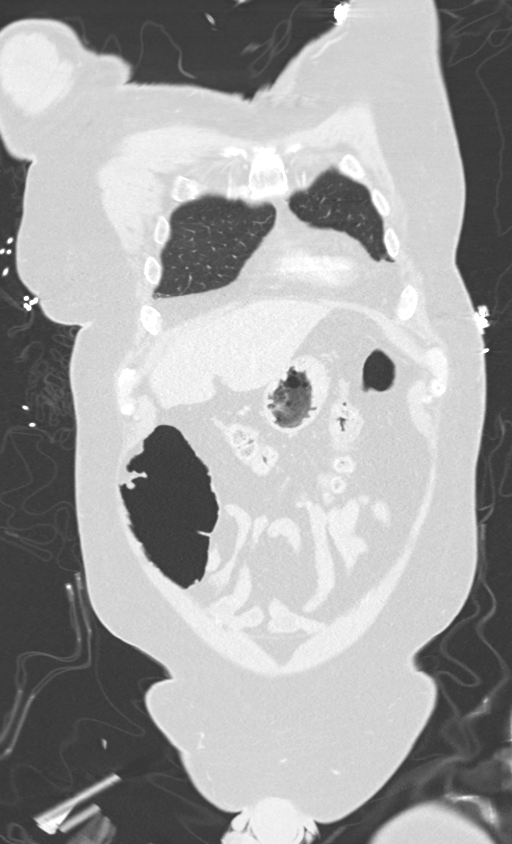
[im 70/175  lung]
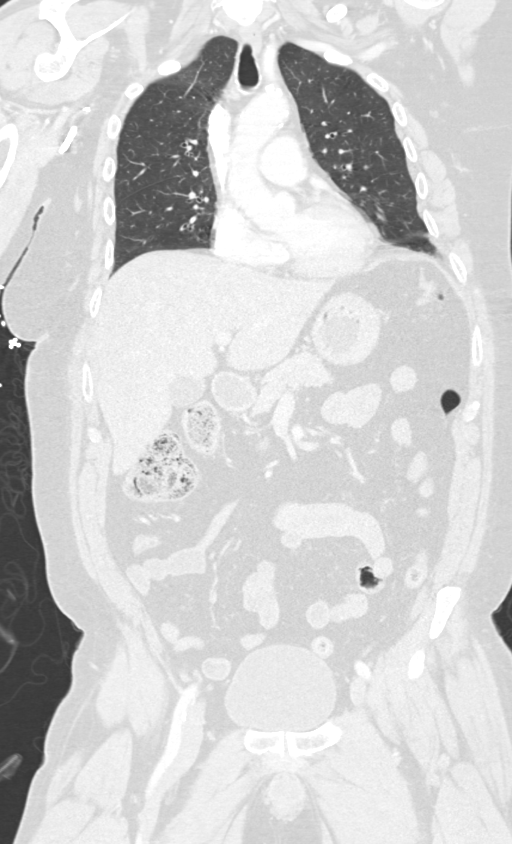
[im 105/175  lung]
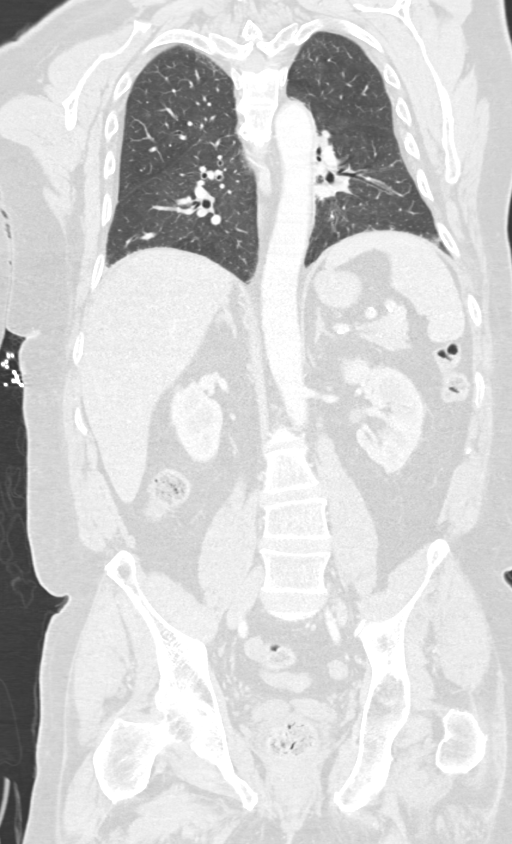

[13 of 36 positions shown; findings below may reference images not displayed]

FINDINGS: CT CHEST FINDINGS

Cardiovascular: The heart is normal in size. The thoracic aorta is
unremarkable, aside from scattered calcification. Mild calcification
is noted along the proximal great vessels.

Mediastinum/Nodes: The mediastinum is otherwise unremarkable. No
mediastinal lymphadenopathy is seen. No pericardial effusion is
identified. The visualized portions of the thyroid gland are
unremarkable. No axillary lymphadenopathy is seen.

Lungs/Pleura: Minimal bilateral atelectasis is noted. No pleural
effusion or pneumothorax is seen. No masses are identified.

Musculoskeletal: There are minimally displaced fractures of the left
lateral fifth through eighth ribs. The visualized musculature is
unremarkable in appearance.

CT ABDOMEN PELVIS FINDINGS

Hepatobiliary: The liver is unremarkable in appearance. The
gallbladder is unremarkable in appearance. The common bile duct
remains normal in caliber.

Pancreas: The pancreas is within normal limits.

Spleen: The spleen is unremarkable in appearance.

Adrenals/Urinary Tract: The adrenal glands are unremarkable in
appearance.

A right renal cyst is noted. Nonspecific perinephric stranding is
noted bilaterally. There is no evidence of hydronephrosis. No renal
or ureteral stones are identified.

Stomach/Bowel: The stomach is unremarkable in appearance. The small
bowel is within normal limits. The patient is status post
appendectomy. The colon is unremarkable in appearance.

Vascular/Lymphatic: Scattered calcification is seen along the
abdominal aorta and its branches. The abdominal aorta is otherwise
grossly unremarkable. The inferior vena cava is grossly
unremarkable. No retroperitoneal lymphadenopathy is seen. No pelvic
sidewall lymphadenopathy is identified.

Reproductive: The bladder is mildly distended and grossly
unremarkable. The prostate is enlarged, measuring 6.1 cm in
transverse dimension, with impression on the base of the bladder.

Other: No additional soft tissue abnormalities are seen.

Musculoskeletal: No acute osseous abnormalities are identified. The
visualized musculature is unremarkable in appearance.
IMPRESSION: 1. Minimally displaced fractures of the left lateral fifth through
eighth ribs.
2. Minimal bilateral atelectasis.  Lungs otherwise clear.
3. Right renal cyst noted.
4. Significantly enlarged prostate, with impression on the base of
the bladder. Would correlate with PSA.

## 2019-04-11 DIAGNOSIS — I2510 Atherosclerotic heart disease of native coronary artery without angina pectoris: Secondary | ICD-10-CM

## 2019-04-11 HISTORY — DX: Atherosclerotic heart disease of native coronary artery without angina pectoris: I25.10

## 2019-09-02 ENCOUNTER — Other Ambulatory Visit: Payer: Self-pay | Admitting: Family Medicine

## 2019-09-02 DIAGNOSIS — I7121 Aneurysm of the ascending aorta, without rupture: Secondary | ICD-10-CM

## 2019-09-19 ENCOUNTER — Other Ambulatory Visit: Payer: Self-pay

## 2019-09-19 ENCOUNTER — Ambulatory Visit
Admission: RE | Admit: 2019-09-19 | Discharge: 2019-09-19 | Disposition: A | Discharge status: Home / Self Care | Payer: Medicare Other | Source: Ambulatory Visit | Attending: Family Medicine | Admitting: Family Medicine

## 2019-09-19 DIAGNOSIS — I7121 Aneurysm of the ascending aorta, without rupture: Secondary | ICD-10-CM

## 2019-09-19 MED ORDER — IOPAMIDOL (ISOVUE-370) INJECTION 76%
75.0000 mL | Freq: Once | INTRAVENOUS | Status: AC | PRN
  Administered 2019-09-19: 75 mL via INTRAVENOUS

## 2021-04-18 ENCOUNTER — Emergency Department
Admission: EM | Admit: 2021-04-18 | Discharge: 2021-04-18 | Disposition: A | Discharge status: Home / Self Care | Payer: Medicare Other | Attending: Emergency Medicine | Admitting: Emergency Medicine

## 2021-04-18 ENCOUNTER — Emergency Department: Payer: Medicare Other

## 2021-04-18 ENCOUNTER — Other Ambulatory Visit: Payer: Self-pay

## 2021-04-18 DIAGNOSIS — M7989 Other specified soft tissue disorders: Secondary | ICD-10-CM | POA: Diagnosis present

## 2021-04-18 DIAGNOSIS — M79604 Pain in right leg: Secondary | ICD-10-CM | POA: Diagnosis not present

## 2021-04-18 LAB — CBC
HCT: 37.8 % — ABNORMAL LOW (ref 39.0–52.0)
Hemoglobin: 13 g/dL (ref 13.0–17.0)
MCH: 32.4 pg (ref 26.0–34.0)
MCHC: 34.4 g/dL (ref 30.0–36.0)
MCV: 94.3 fL (ref 80.0–100.0)
Platelets: 188 10*3/uL (ref 150–400)
RBC: 4.01 MIL/uL — ABNORMAL LOW (ref 4.22–5.81)
RDW: 14.3 % (ref 11.5–15.5)
WBC: 6.4 10*3/uL (ref 4.0–10.5)
nRBC: 0 % (ref 0.0–0.2)

## 2021-04-18 LAB — BASIC METABOLIC PANEL
Anion gap: 7 (ref 5–15)
BUN: 18 mg/dL (ref 8–23)
CO2: 32 mmol/L (ref 22–32)
Calcium: 9.4 mg/dL (ref 8.9–10.3)
Chloride: 98 mmol/L (ref 98–111)
Creatinine, Ser: 0.94 mg/dL (ref 0.61–1.24)
GFR, Estimated: >60 mL/min (ref >60)
Glucose, Bld: 125 mg/dL — ABNORMAL HIGH (ref 70–99)
Potassium: 3.8 mmol/L (ref 3.5–5.1)
Sodium: 137 mmol/L (ref 135–145)

## 2021-04-18 MED ORDER — HYDROCODONE-ACETAMINOPHEN 5-325 MG PO TABS
1.0000 | ORAL_TABLET | Freq: Once | ORAL | Status: AC
  Administered 2021-04-18: 1 via ORAL
  Filled 2021-04-18: qty 1

## 2021-04-18 MED ORDER — ONDANSETRON 4 MG PO TBDP
4.0000 mg | ORAL_TABLET | Freq: Once | ORAL | Status: AC
  Administered 2021-04-18: 4 mg via ORAL
  Filled 2021-04-18: qty 1

## 2021-04-18 MED ORDER — MELOXICAM 7.5 MG PO TABS: 7.5000 mg | ORAL_TABLET | Freq: Every day | ORAL | 0 refills | Status: AC

## 2021-04-18 NOTE — Discharge Instructions (Signed)
Take Meloxicam once daily for pain and inflammation.  

## 2021-04-18 NOTE — ED Provider Notes (Signed)
[facility] Provider Note  Patient Contact: 7:58 PM (approximate)   History   Leg Pain   HPI  Colton Cummings is a 76 y.o. male presents to the emergency department with concern for right thigh swelling for the past 2 days.  Patient denies noticing any erythema or edema.  He has no suprapubic pain.  He does have reproducible discomfort with internal and external rotation of the right hip.  No prior history of right hip fractures or osteoarthritis.  No fever or chills.  No other alleviating measures have been attempted.      Physical Exam   Triage Vital Signs: ED Triage Vitals  Enc Vitals Group     BP 04/18/21 1645 (!) 154/69     Pulse Rate 04/18/21 1645 81     Resp 04/18/21 1645 20     Temp 04/18/21 1645 97.7 F (36.5 C)     Temp Source 04/18/21 1645 Oral     SpO2 04/18/21 1645 93 %     Weight 04/18/21 1646 250 lb (113.4 kg)     Height 04/18/21 1646 6\' 2"  (1.88 m)     Head Circumference --      Peak Flow --      Pain Score 04/18/21 1645 0     Pain Loc --      Pain Edu? --      Excl. in [place]? --     Most recent vital signs: Vitals:   04/18/21 1645 04/18/21 2113  BP: (!) 154/69 (!) 148/71  Pulse: 81 87  Resp: 20 20  Temp: 97.7 F (36.5 C)   SpO2: 93% 94%     General: Alert and in no acute distress. Eyes:  PERRL. EOMI. Head: No acute traumatic findings ENT:      Ears:       Nose: No congestion/rhinnorhea.      Mouth/Throat: Mucous membranes are moist. Neck: No stridor. No cervical spine tenderness to palpation. Cardiovascular:  Good peripheral perfusion Respiratory: Normal respiratory effort without tachypnea or retractions. Lungs CTAB. Good air entry to the bases with no decreased or absent breath sounds. Gastrointestinal: Bowel sounds 4 quadrants. Soft and nontender to palpation. No guarding or rigidity. No palpable masses. No distention. No CVA tenderness. Musculoskeletal: Full range of motion to all extremities.  Neurologic:  No  gross focal neurologic deficits are appreciated.  Skin:   No rash noted Other:   ED Results / Procedures / Treatments   Labs (all labs ordered are listed, but only abnormal results are displayed) Labs Reviewed  CBC - Abnormal; Notable for the following components:      Result Value   RBC 4.01 (*)    HCT 37.8 (*)    All other components within normal limits  BASIC METABOLIC PANEL - Abnormal; Notable for the following components:   Glucose, Bld 125 (*)    All other components within normal limits        RADIOLOGY  I personally viewed and evaluated these images as part of my medical decision making, as well as reviewing the written report by the radiologist.  ED Provider Interpretation: No acute bony abnormality of the right hip.      MEDICATIONS ORDERED IN ED: Medications  HYDROcodone-acetaminophen (NORCO/VICODIN) 5-325 MG per tablet 1 tablet (1 tablet Oral Given 04/18/21 2109)  ondansetron (ZOFRAN-ODT) disintegrating tablet 4 mg (4 mg Oral Given 04/18/21 2109)     IMPRESSION / MDM / ASSESSMENT AND PLAN / ED COURSE  I reviewed the triage vital signs and the nursing notes.                              Differential diagnosis includes, but is not limited to, osteoarthritis, DVT, adductor strain...  Assessment and plan: Pain:  76 year old male presents to the emergency department with concern for pain along the right anterior thigh that is reproduced with range of motion at the right hip.  Vital signs were reassuring at triage.  On physical exam, patient was alert, active and nontoxic-appearing.  There was no bony abnormality on x-ray of the right hip.  No evidence of DVT on venous ultrasound.  Recommended low-dose meloxicam, ice and elevation at home with follow-up with primary care as needed.  Return precautions were given to return with new or worsening symptoms.     FINAL CLINICAL IMPRESSION(S) / ED DIAGNOSES   Final diagnoses:  Right leg pain     Rx / DC  Orders   ED Discharge Orders          Ordered    meloxicam (MOBIC) 7.5 MG tablet  Daily        04/18/21 2047             Note:  This document was prepared using Dragon voice recognition software and may include unintentional dictation errors.   Vallarie Mare [place], Hershal Coria 04/18/21 2224    Vanessa Ridgeway, MD 04/23/21 2006

## 2021-04-18 NOTE — ED Triage Notes (Signed)
Pt to ED right upper leg and groin pain that started last night with swelling. Increased pain with ambulation.

## 2021-04-18 NOTE — ED Provider Triage Note (Signed)
Emergency Medicine Provider Triage Evaluation Note  Colton Cummings , a 76 y.o. male  was evaluated in triage.  Pt complains of right-sided groin pain that worsens when patient attempts to stand.  Patient denies lower abdominal pain.  No fever or chills.  Denies a history of hernias.  Denies scrotal pain.  Denies edema or erythema of the lower extremities.  Patient denies falls or mechanisms of trauma.  No similar symptoms in the past.  Review of Systems  Positive: Patient has right sided groin pain.  Negative: No chest pain, chest tightness or shortness of breath.   Physical Exam  BP (!) 154/69    Pulse 81    Temp 97.7 F (36.5 C) (Oral)    Resp 20    Ht 6\' 2"  (1.88 m)    Wt 113.4 kg    SpO2 93%    BMI 32.10 kg/m  Gen:   Awake, no distress   Resp:  Normal effort  MSK:   Moves extremities without difficulty  Other:    Medical Decision Making  Medically screening exam initiated at 4:49 PM.  Appropriate orders placed.  Haze Boyden was informed that the remainder of the evaluation will be completed by another provider, this initial triage assessment does not replace that evaluation, and the importance of remaining in the ED until their evaluation is complete.     Vallarie Mare [place], [place] 04/18/21 1652

## 2021-06-16 DIAGNOSIS — M150 Primary generalized (osteo)arthritis: Secondary | ICD-10-CM | POA: Diagnosis not present

## 2021-06-16 DIAGNOSIS — R5383 Other fatigue: Secondary | ICD-10-CM | POA: Diagnosis not present

## 2021-06-16 DIAGNOSIS — Z79899 Other long term (current) drug therapy: Secondary | ICD-10-CM | POA: Diagnosis not present

## 2021-06-16 DIAGNOSIS — M0579 Rheumatoid arthritis with rheumatoid factor of multiple sites without organ or systems involvement: Secondary | ICD-10-CM | POA: Diagnosis not present

## 2021-06-16 DIAGNOSIS — M1009 Idiopathic gout, multiple sites: Secondary | ICD-10-CM | POA: Diagnosis not present

## 2021-06-16 DIAGNOSIS — M2550 Pain in unspecified joint: Secondary | ICD-10-CM | POA: Diagnosis not present

## 2021-06-27 ENCOUNTER — Encounter: Payer: Self-pay | Admitting: Internal Medicine

## 2021-09-28 DIAGNOSIS — M1009 Idiopathic gout, multiple sites: Secondary | ICD-10-CM | POA: Diagnosis not present

## 2021-09-28 DIAGNOSIS — Z79899 Other long term (current) drug therapy: Secondary | ICD-10-CM | POA: Diagnosis not present

## 2021-09-28 DIAGNOSIS — M0579 Rheumatoid arthritis with rheumatoid factor of multiple sites without organ or systems involvement: Secondary | ICD-10-CM | POA: Diagnosis not present

## 2021-09-28 DIAGNOSIS — M1991 Primary osteoarthritis, unspecified site: Secondary | ICD-10-CM | POA: Diagnosis not present

## 2021-11-04 DIAGNOSIS — Z125 Encounter for screening for malignant neoplasm of prostate: Secondary | ICD-10-CM | POA: Diagnosis not present

## 2021-11-18 DIAGNOSIS — E782 Mixed hyperlipidemia: Secondary | ICD-10-CM | POA: Diagnosis not present

## 2021-11-18 DIAGNOSIS — M069 Rheumatoid arthritis, unspecified: Secondary | ICD-10-CM | POA: Diagnosis not present

## 2021-11-18 DIAGNOSIS — I10 Essential (primary) hypertension: Secondary | ICD-10-CM | POA: Diagnosis not present

## 2021-11-18 DIAGNOSIS — R7302 Impaired glucose tolerance (oral): Secondary | ICD-10-CM | POA: Diagnosis not present

## 2021-11-29 DIAGNOSIS — Z0289 Encounter for other administrative examinations: Secondary | ICD-10-CM

## 2021-12-06 ENCOUNTER — Encounter (INDEPENDENT_AMBULATORY_CARE_PROVIDER_SITE_OTHER): Payer: Self-pay | Admitting: Family Medicine

## 2021-12-06 ENCOUNTER — Ambulatory Visit (INDEPENDENT_AMBULATORY_CARE_PROVIDER_SITE_OTHER): Payer: Medicare Other | Admitting: Family Medicine

## 2021-12-06 VITALS — BP 127/77 | HR 61 | Temp 97.7°F | Ht 72.0 in | Wt 255.0 lb

## 2021-12-06 DIAGNOSIS — E7849 Other hyperlipidemia: Secondary | ICD-10-CM

## 2021-12-06 DIAGNOSIS — Z6834 Body mass index (BMI) 34.0-34.9, adult: Secondary | ICD-10-CM

## 2021-12-06 DIAGNOSIS — E669 Obesity, unspecified: Secondary | ICD-10-CM

## 2021-12-06 DIAGNOSIS — R5383 Other fatigue: Secondary | ICD-10-CM

## 2021-12-06 DIAGNOSIS — M0579 Rheumatoid arthritis with rheumatoid factor of multiple sites without organ or systems involvement: Secondary | ICD-10-CM

## 2021-12-06 DIAGNOSIS — R0602 Shortness of breath: Secondary | ICD-10-CM

## 2021-12-06 DIAGNOSIS — I10 Essential (primary) hypertension: Secondary | ICD-10-CM | POA: Diagnosis not present

## 2021-12-06 DIAGNOSIS — Z1331 Encounter for screening for depression: Secondary | ICD-10-CM

## 2021-12-06 DIAGNOSIS — E559 Vitamin D deficiency, unspecified: Secondary | ICD-10-CM

## 2021-12-06 DIAGNOSIS — R7309 Other abnormal glucose: Secondary | ICD-10-CM | POA: Diagnosis not present

## 2021-12-06 DIAGNOSIS — R7401 Elevation of levels of liver transaminase levels: Secondary | ICD-10-CM

## 2021-12-07 ENCOUNTER — Other Ambulatory Visit: Payer: Self-pay | Admitting: Family Medicine

## 2021-12-07 ENCOUNTER — Other Ambulatory Visit (HOSPITAL_COMMUNITY): Payer: Self-pay | Admitting: Family Medicine

## 2021-12-07 DIAGNOSIS — E782 Mixed hyperlipidemia: Secondary | ICD-10-CM

## 2021-12-07 LAB — LIPID PANEL WITH LDL/HDL RATIO
Cholesterol, Total: 108 mg/dL (ref 100–199)
HDL: 36 mg/dL — ABNORMAL LOW (ref >39)
LDL Chol Calc (NIH): 54 mg/dL (ref 0–99)
LDL/HDL Ratio: 1.5 ratio (ref 0.0–3.6)
Triglycerides: 92 mg/dL (ref 0–149)
VLDL Cholesterol Cal: 18 mg/dL (ref 5–40)

## 2021-12-07 LAB — T3: T3, Total: 136 ng/dL (ref 71–180)

## 2021-12-07 LAB — HEMOGLOBIN A1C
Est. average glucose Bld gHb Est-mCnc: 105 mg/dL
Hgb A1c MFr Bld: 5.3 % (ref 4.8–5.6)

## 2021-12-07 LAB — COMPREHENSIVE METABOLIC PANEL
ALT: 40 IU/L (ref 0–44)
AST: 28 IU/L (ref 0–40)
Albumin/Globulin Ratio: 2 (ref 1.2–2.2)
Albumin: 4.4 g/dL (ref 3.8–4.8)
Alkaline Phosphatase: 57 IU/L (ref 44–121)
BUN/Creatinine Ratio: 20 (ref 10–24)
BUN: 17 mg/dL (ref 8–27)
Bilirubin Total: 0.5 mg/dL (ref 0.0–1.2)
CO2: 27 mmol/L (ref 20–29)
Calcium: 9.5 mg/dL (ref 8.6–10.2)
Chloride: 102 mmol/L (ref 96–106)
Creatinine, Ser: 0.83 mg/dL (ref 0.76–1.27)
Globulin, Total: 2.2 g/dL (ref 1.5–4.5)
Glucose: 93 mg/dL (ref 70–99)
Potassium: 3.7 mmol/L (ref 3.5–5.2)
Sodium: 143 mmol/L (ref 134–144)
Total Protein: 6.6 g/dL (ref 6.0–8.5)
eGFR: 91 mL/min/{1.73_m2} (ref >59)

## 2021-12-07 LAB — TSH: TSH: 1.34 u[IU]/mL (ref 0.450–4.500)

## 2021-12-07 LAB — T4, FREE: Free T4: 1.12 ng/dL (ref 0.82–1.77)

## 2021-12-07 LAB — VITAMIN D 25 HYDROXY (VIT D DEFICIENCY, FRACTURES): Vit D, 25-Hydroxy: 63.5 ng/mL (ref 30.0–100.0)

## 2021-12-07 LAB — FOLATE: Folate: >20 ng/mL

## 2021-12-07 LAB — VITAMIN B12: Vitamin B-12: 585 pg/mL (ref 232–1245)

## 2021-12-07 LAB — INSULIN, RANDOM: INSULIN: 17 u[IU]/mL (ref 2.6–24.9)

## 2021-12-12 NOTE — Progress Notes (Signed)
Dear Colton Cummings,   Thank you for referring Colton Cummings to our clinic. The following note includes my evaluation and treatment recommendations.  Chief Complaint:   Colton Cummings (MR# [id number]) is a 76 y.o. male who presents for evaluation and treatment of Colton and related comorbidities. Current BMI is Body mass index is 34.58 kg/m. Colton Cummings has been struggling with his weight for many years and has been unsuccessful in either losing weight, maintaining weight loss, or reaching his healthy weight goal.  Colton Cummings is currently in the action stage of change and ready to dedicate time achieving and maintaining a healthier weight. Colton Cummings is interested in becoming our patient and working on intensive lifestyle modifications including (but not limited to) diet and exercise for weight loss.  Colton Cummings lost weight recently doing a low carb diet. He eats breakfast out at [employer]. Colton Cummings gets 3 eggs, 2 small sausage or 3 pieces of bacon, 1 cup of hash browns, and 2 pieces of toast in which he feels satisfied. Lunch is a hamburger and medium fries from [employer] or [employer] and a diet Coke. He feels satisfied. Dinner is around 6:00pm at [employer]. Colton Cummings eats beef tips over 1/2 cup of rice, 1/2 cup of green beans, salad, roll and a 2 doughnuts. He feels satisfied after dinner.  Colton Cummings's habits were reviewed today and are as follows: His family eats meals together, he thinks his family will eat healthier with him, his desired weight loss is 40, he has been heavy most of his life, his heaviest weight ever was 280 pounds, he snacks frequently in the evenings, he is frequently drinking liquids with calories, he frequently makes poor food choices, he frequently eats larger portions than normal, and he has binge eating behaviors.  Depression Screen Colton Cummings's Food and Mood (modified PHQ-9) score was 6.     12/06/2021    7:31 AM  Depression screen PHQ 2/9  Decreased Interest 2  Down, Depressed,  Hopeless 1  PHQ - 2 Score 3  Altered sleeping 1  Tired, decreased energy 2  Change in appetite 0  Feeling bad or failure about yourself  0  Trouble concentrating 0  Moving slowly or fidgety/restless 0  Suicidal thoughts 0  PHQ-9 Score 6  Difficult doing work/chores Not difficult at all   Subjective:   1. Other fatigue Colton Cummings's EKG had a normal sinus rhythm at 62 bpm. Colton Cummings denies daytime somnolence and denies waking up still tired. Patient has a history of symptoms of morning headache and hypertension. Colton Cummings generally gets 7 or 8 hours of sleep per night, and states that he has generally restful sleep. Snoring is present. Apneic episodes are not present. Epworth Sleepiness Score is 5.   2. SOBOE (shortness of breath on exertion) Colton Cummings notes increasing shortness of breath with exercising and seems to be worsening over time with weight gain. He notes getting out of breath sooner with activity than he used to. This has not gotten worse recently. Colton Cummings denies shortness of breath at rest or orthopnea.  3. Elevated random blood glucose level Colton Cummings's fasting blood sugar was 132 in 09/29/21.  4. Essential hypertension Colton Cummings was diagnosed with hypertension about 20 years ago. He is on chlorthalidone and losartan. Colton Cummings's blood pressure is well controlled today.  5. Other hyperlipidemia Colton Cummings was diagnosed with hyperlipidemia about 10 years ago. He is on simvastatin and denies myalgias.  6. Transaminitis Colton Cummings's ALT was slightly elevated on his last labs.   7.  Rheumatoid arthritis involving multiple sites with positive rheumatoid factor Colton Cummings) Colton Cummings sees Colton Cummings and reports that his diagnosis is well managed. He takes methotrexate and Enbrel.  8. Vitamin D deficiency Colton Cummings has a likely diagnosis of vitamin D deficiency given Colton. He is not on a vitamin D supplement.  Assessment/Plan:   1. Other fatigue Colton Cummings does feel that his weight is causing his energy to be lower than it  should be. Fatigue may be related to Colton, depression or many other causes. Labs will be ordered, and in the meanwhile, Colton Cummings will focus on self care including making healthy food choices, increasing physical activity and focusing on stress reduction.  - EKG 12-Lead - TSH - T4, free - T3  2. SOBOE (shortness of breath on exertion) Colton Cummings does feel that he gets out of breath more easily that he used to when he exercises. Colton Cummings's shortness of breath appears to be Colton related and exercise induced. He has agreed to work on weight loss and gradually increase exercise to treat his exercise induced shortness of breath. Will continue to monitor closely.  3. Elevated random blood glucose level Labs were ordered today and we will follow up at the next visit.  - Hemoglobin A1c - Insulin, random  4. Essential hypertension Labs were drawn today and Colton Cummings agrees to follow up as directed.  - Comprehensive metabolic panel  5. Other hyperlipidemia Labs were drawn today and will be discussed at the next office visit.  - Lipid Panel With LDL/HDL Ratio  6. Transaminitis Labs were obtained today. Colton Cummings agrees to follow up at the next appointment.  7. Rheumatoid arthritis involving multiple sites with positive rheumatoid factor (Colton Cummings) Labs were drawn today. Colton Cummings agrees to follow up with Colton Cummings for further management.   - Vitamin B12 - Folate  8. Vitamin D deficiency Labs were drawn today. Colton Cummings agrees to follow up as directed.  - VITAMIN D 25 Hydroxy (Vit-D Deficiency, Fractures)  9. Depression screening Colton Cummings had a negative depression screening. Depression is commonly associated with Colton and often results in emotional eating behaviors. We will monitor this closely and work on CBT to help improve the non-hunger eating patterns.  10. Class 1 Colton with serious comorbidity and body mass index (BMI) of 34.0 to 34.9 in adult, unspecified Colton type Colton Cummings is currently in the  action stage of change and his goal is to continue with weight loss efforts. I recommend Colton Cummings begin the structured treatment plan as follows:  He has agreed to keeping a food journal and adhering to recommended goals of 1900 to 2000 calories and 120+ grams of protein daily.  Exercise goals: No exercise has been prescribed at this time.   Behavioral modification strategies: increasing lean protein intake, decreasing eating out, meal planning and cooking strategies, keeping healthy foods in the home, and planning for success.  He was informed of the importance of frequent follow-up visits to maximize his success with intensive lifestyle modifications for his multiple health conditions. He was informed we would discuss his lab results at his next visit unless there is a critical issue that needs to be addressed sooner. Colton Cummings agreed to keep his next visit at the agreed upon time to discuss these results.  Objective:   Blood pressure 127/77, pulse 61, temperature 97.7 F (36.5 C), height 6' (1.829 m), weight 255 lb (115.7 kg), SpO2 96 %. Body mass index is 34.58 kg/m.  EKG: Normal sinus rhythm, rate 62.  Indirect Calorimeter completed today shows a  VO2 of 336 and a REE of 2318.  His calculated basal metabolic rate is 8338 thus his basal metabolic rate is worse than expected.  General: Cooperative, alert, well developed, in no acute distress. HEENT: Conjunctivae and lids unremarkable. Cardiovascular: Regular rhythm.  Lungs: Normal work of breathing. Neurologic: No focal deficits.   Lab Results  Component Value Date   CREATININE 0.83 12/06/2021   BUN 17 12/06/2021   NA 143 12/06/2021   K 3.7 12/06/2021   CL 102 12/06/2021   CO2 27 12/06/2021   Lab Results  Component Value Date   ALT 40 12/06/2021   AST 28 12/06/2021   ALKPHOS 57 12/06/2021   BILITOT 0.5 12/06/2021   Lab Results  Component Value Date   HGBA1C 5.3 12/06/2021   Lab Results  Component Value Date   INSULIN  17.0 12/06/2021   Lab Results  Component Value Date   TSH 1.340 12/06/2021   Lab Results  Component Value Date   CHOL 108 12/06/2021   HDL 36 (L) 12/06/2021   LDLCALC 54 12/06/2021   TRIG 92 12/06/2021   Lab Results  Component Value Date   WBC 6.4 04/18/2021   HGB 13.0 04/18/2021   HCT 37.8 (L) 04/18/2021   MCV 94.3 04/18/2021   PLT 188 04/18/2021   No results found for: "IRON", "TIBC", "FERRITIN" Colton Behavioral Intervention:   Approximately 15 minutes were spent on the discussion below.  ASK: We discussed the diagnosis of Colton with Colton Cummings today and Colton Cummings agreed to give [place] permission to discuss Colton behavioral modification therapy today.  ASSESS: Korbyn has the diagnosis of Colton and his BMI today is 34.7. Kc is in the action stage of change.   ADVISE: Colton Cummings was educated on the multiple health risks of Colton as well as the benefit of weight loss to improve his health. He was advised of the need for long term treatment and the importance of lifestyle modifications to improve his current health and to decrease his risk of future health problems.  AGREE: Multiple dietary modification options and treatment options were discussed and Colton Cummings agreed to follow the recommendations documented in the above note.  ARRANGE: Colton Cummings was educated on the importance of frequent visits to treat Colton as outlined per CMS and USPSTF guidelines and agreed to schedule his next follow up appointment today.  Attestation Statements:   Reviewed by clinician on day of visit: allergies, medications, problem list, medical history, surgical history, family history, social history, and previous encounter notes.  Lenward Chancellor, CMA, am acting as transcriptionist for Coralie Common, MD  This is the patient's first visit at [facility]. The patient's NEW PATIENT PACKET was reviewed at length. Included in the packet: current and past health history,  medications, allergies, ROS, gynecologic history (women only), surgical history, family history, social history, weight history, weight loss surgery history (for those that have had weight loss surgery), nutritional evaluation, mood and food questionnaire, PHQ9, Epworth questionnaire, sleep habits questionnaire, patient life and health improvement goals questionnaire. These will all be scanned into the patient's chart under media.   During the visit, I independently reviewed the patient's EKG, bioimpedance scale results, and indirect calorimeter results. I used this information to tailor a meal plan for the patient that will help him to lose weight and will improve his Colton-related conditions going forward. I performed a medically necessary appropriate examination and/or evaluation. I discussed the assessment and treatment plan with the patient. The patient was provided an opportunity  to ask questions and all were answered. The patient agreed with the plan and demonstrated an understanding of the instructions. Labs were ordered at this visit and will be reviewed at the next visit unless more critical results need to be addressed immediately. Clinical information was updated and documented in the EMR.   Time spent on visit including pre-visit chart review and post-visit care was 40 minutes.   I have reviewed the above documentation for accuracy and completeness, and I agree with the above. - Coralie Common, MD

## 2021-12-20 ENCOUNTER — Ambulatory Visit (INDEPENDENT_AMBULATORY_CARE_PROVIDER_SITE_OTHER): Payer: Medicare Other | Admitting: Family Medicine

## 2021-12-20 ENCOUNTER — Encounter (INDEPENDENT_AMBULATORY_CARE_PROVIDER_SITE_OTHER): Payer: Self-pay | Admitting: Family Medicine

## 2021-12-20 VITALS — BP 100/64 | HR 90 | Temp 98.3°F | Ht 72.0 in | Wt 244.0 lb

## 2021-12-20 DIAGNOSIS — E669 Obesity, unspecified: Secondary | ICD-10-CM

## 2021-12-20 DIAGNOSIS — I10 Essential (primary) hypertension: Secondary | ICD-10-CM

## 2021-12-20 DIAGNOSIS — E7849 Other hyperlipidemia: Secondary | ICD-10-CM | POA: Diagnosis not present

## 2021-12-20 DIAGNOSIS — E559 Vitamin D deficiency, unspecified: Secondary | ICD-10-CM | POA: Diagnosis not present

## 2021-12-20 DIAGNOSIS — E8881 Metabolic syndrome: Secondary | ICD-10-CM | POA: Diagnosis not present

## 2021-12-20 DIAGNOSIS — Z6833 Body mass index (BMI) 33.0-33.9, adult: Secondary | ICD-10-CM

## 2021-12-22 NOTE — Progress Notes (Unsigned)
Chief Complaint:   OBESITY Colton Cummings is here to discuss his progress with his obesity treatment plan along with follow-up of his obesity related diagnoses. Colton Cummings is on keeping a food journal and adhering to recommended goals of 1900-2000 calories and 120+ grams of protein and states he is following his eating plan approximately 80% of the time. Colton Cummings states he is walking 5-6,000 steps 7 times per week.  Today's visit was #: 2 Starting weight: 255 lbs Starting date: 12/06/2021 Today's weight: 244 lbs Today's date: 12/20/2021 Total lbs lost to date: 11 lbs Total lbs lost since last in-office visit: 11  Interim History: Colton Cummings felt hungry a decent amount of last few weeks. Trying to correctly estimate food intake has been at mom and pop places has been difficult. Dinner ok but he is hungry after dinner. Ending 2000 cal/day ( a few days over and 1-2 days lower). Not sure where he is protein wise. Has been writing food down and not logging electronically.  Subjective:   1. Vitamin D deficiency Colton Cummings is on 1k IU/day. Notes fatigue. Vit D level of 63.5.  2. Insulin resistance Colton Cummings's A1c 5.3, insulin 17.0.  3. Other hyperlipidemia Colton Cummings is on Zocor 20 mg daily. LDL 54, HDL 36, Trigly 92.  4. Essential hypertension Colton Cummings's blood pressure very well controlled today. Denies chest pain, chest pressure and headache, dizziness or lightheadedness.  Assessment/Plan:   1. Vitamin D deficiency Continue over the counter Vit D.  2. Insulin resistance Pathophysiology of IR, Prediabetes, diabetes. He has not paid attention to carbohydrates vs protein intake--will start electronically.  3. Other hyperlipidemia Repeat FLP in 3 months without changes in dose of medication.  4. Essential hypertension Follow up blood pressure at next appointment may want to decrease medications if blood pressure stays low end of normal.  5. Obesity with current BMI of 33.2 Colton Cummings is currently in  the action stage of change. As such, his goal is to continue with weight loss efforts. He has agreed to keeping a food journal and adhering to recommended goals of 1900-2000 calories and 120+ grams of protein daily.   Exercise goals: No exercise has been prescribed at this time.  Colton Cummings to start electronic logging.  Behavioral modification strategies: increasing lean protein intake, meal planning and cooking strategies, keeping healthy foods in the home, and keeping a strict food journal.  Colton Cummings has agreed to follow-up with our clinic in 3 weeks. He was informed of the importance of frequent follow-up visits to maximize his success with intensive lifestyle modifications for his multiple health conditions.   Objective:   Blood pressure 100/64, pulse 90, temperature 98.3 F (36.8 C), height 6' (1.829 m), weight 244 lb (110.7 kg), SpO2 96 %. Body mass index is 33.09 kg/m.  General: Cooperative, alert, well developed, in no acute distress. HEENT: Conjunctivae and lids unremarkable. Cardiovascular: Regular rhythm.  Lungs: Normal work of breathing. Neurologic: No focal deficits.   Lab Results  Component Value Date   CREATININE 0.83 12/06/2021   BUN 17 12/06/2021   NA 143 12/06/2021   K 3.7 12/06/2021   CL 102 12/06/2021   CO2 27 12/06/2021   Lab Results  Component Value Date   ALT 40 12/06/2021   AST 28 12/06/2021   ALKPHOS 57 12/06/2021   BILITOT 0.5 12/06/2021   Lab Results  Component Value Date   HGBA1C 5.3 12/06/2021   Lab Results  Component Value Date   INSULIN 17.0 12/06/2021   Lab Results  Component Value Date   TSH 1.340 12/06/2021   Lab Results  Component Value Date   CHOL 108 12/06/2021   HDL 36 (L) 12/06/2021   LDLCALC 54 12/06/2021   TRIG 92 12/06/2021   Lab Results  Component Value Date   VD25OH 63.5 12/06/2021   Lab Results  Component Value Date   WBC 6.4 04/18/2021   HGB 13.0 04/18/2021   HCT 37.8 (L) 04/18/2021   MCV 94.3 04/18/2021    PLT 188 04/18/2021   No results found for: "IRON", "TIBC", "FERRITIN"  Attestation Statements:   Reviewed by clinician on day of visit: allergies, medications, problem list, medical history, surgical history, family history, social history, and previous encounter notes.  Time spent on visit including pre-visit chart review and post-visit care and charting was 25 minutes.   I, Elnora Morrison, RMA am acting as transcriptionist for Coralie Common, MD.  I have reviewed the above documentation for accuracy and completeness, and I agree with the above. - Coralie Common, MD

## 2021-12-26 DIAGNOSIS — R3915 Urgency of urination: Secondary | ICD-10-CM | POA: Diagnosis not present

## 2021-12-26 DIAGNOSIS — N401 Enlarged prostate with lower urinary tract symptoms: Secondary | ICD-10-CM | POA: Diagnosis not present

## 2021-12-26 DIAGNOSIS — R351 Nocturia: Secondary | ICD-10-CM | POA: Diagnosis not present

## 2021-12-30 DIAGNOSIS — M0579 Rheumatoid arthritis with rheumatoid factor of multiple sites without organ or systems involvement: Secondary | ICD-10-CM | POA: Diagnosis not present

## 2022-01-04 ENCOUNTER — Ambulatory Visit (HOSPITAL_BASED_OUTPATIENT_CLINIC_OR_DEPARTMENT_OTHER)
Admission: RE | Admit: 2022-01-04 | Discharge: 2022-01-04 | Disposition: A | Discharge status: Home / Self Care | Payer: Medicare Other | Source: Ambulatory Visit | Attending: Family Medicine | Admitting: Family Medicine

## 2022-01-04 DIAGNOSIS — E782 Mixed hyperlipidemia: Secondary | ICD-10-CM | POA: Insufficient documentation

## 2022-01-17 ENCOUNTER — Ambulatory Visit (INDEPENDENT_AMBULATORY_CARE_PROVIDER_SITE_OTHER): Payer: Medicare Other | Admitting: Family Medicine

## 2022-01-17 ENCOUNTER — Encounter (INDEPENDENT_AMBULATORY_CARE_PROVIDER_SITE_OTHER): Payer: Self-pay | Admitting: Family Medicine

## 2022-01-17 VITALS — BP 132/66 | HR 78 | Temp 98.2°F | Ht 72.0 in | Wt 237.0 lb

## 2022-01-17 DIAGNOSIS — Z6832 Body mass index (BMI) 32.0-32.9, adult: Secondary | ICD-10-CM | POA: Diagnosis not present

## 2022-01-17 DIAGNOSIS — E669 Obesity, unspecified: Secondary | ICD-10-CM | POA: Diagnosis not present

## 2022-01-17 DIAGNOSIS — I10 Essential (primary) hypertension: Secondary | ICD-10-CM

## 2022-01-19 NOTE — Progress Notes (Signed)
Chief Complaint:   OBESITY Colton Cummings is here to discuss his progress with his obesity treatment plan along with follow-up of his obesity related diagnoses. [name] is on keeping a food journal and adhering to recommended goals of 1900-2000 calories and 120 grams of protein and states he is following his eating plan approximately 95% of the time. Halvor states he is walking 6-7,000 steps at work.   Today's visit was #: 3 Starting weight: 255 lbs Starting date: 12/06/2021 Today's weight: 237 lbs Today's date: 01/17/2022 Total lbs lost to date: 18 lbs Total lbs lost since last in-office visit: 7  Interim History: Colton Cummings is trying to mentally keep up with calorie and protein intake but he is not writing it down. Thinks he is getting 1900 cal/day and then 100g of protein. Going to a local family restaurant for breakfast and splitting Chicken Parmesan with his wife with salad. Supper is often 45 calorie bread with a few slices of country ham and a couple of sugar free jello. Thinks he may go to [place] to go to [place] experience.  Subjective:   1. Essential hypertension Blood pressure well controlled today. Denies chest pain, chest pressure and headache. He is on Cozaar, Chlorthalidone.  Assessment/Plan:   1. Essential hypertension Follow up with blood pressure at next appointment.  2. Obesity with current BMI of 32.2 [name] is currently in the action stage of change. As such, his goal is to continue with weight loss efforts. He has agreed to keeping a food journal and adhering to recommended goals of 1900-2000 calories and 120+ grams of protein daily.   Olson still not getting enough protein in daily.  Exercise goals: All adults should avoid inactivity. Some physical activity is better than none, and adults who participate in any amount of physical activity gain some health benefits.  Behavioral modification strategies: increasing lean protein intake, meal planning and cooking  strategies, keeping healthy foods in the home, planning for success, and keeping a strict food journal.  Ogle has agreed to follow-up with our clinic in 3 weeks. He was informed of the importance of frequent follow-up visits to maximize his success with intensive lifestyle modifications for his multiple health conditions.   Objective:   Blood pressure 132/66, pulse 78, temperature 98.2 F (36.8 C), height 6' (1.829 m), weight 237 lb (107.5 kg), SpO2 99 %. Body mass index is 32.14 kg/m.  General: Cooperative, alert, well developed, in no acute distress. HEENT: Conjunctivae and lids unremarkable. Cardiovascular: Regular rhythm.  Lungs: Normal work of breathing. Neurologic: No focal deficits.   Lab Results  Component Value Date   CREATININE 0.83 12/06/2021   BUN 17 12/06/2021   NA 143 12/06/2021   K 3.7 12/06/2021   CL 102 12/06/2021   CO2 27 12/06/2021   Lab Results  Component Value Date   ALT 40 12/06/2021   AST 28 12/06/2021   ALKPHOS 57 12/06/2021   BILITOT 0.5 12/06/2021   Lab Results  Component Value Date   HGBA1C 5.3 12/06/2021   Lab Results  Component Value Date   INSULIN 17.0 12/06/2021   Lab Results  Component Value Date   TSH 1.340 12/06/2021   Lab Results  Component Value Date   CHOL 108 12/06/2021   HDL 36 (L) 12/06/2021   LDLCALC 54 12/06/2021   TRIG 92 12/06/2021   Lab Results  Component Value Date   VD25OH 63.5 12/06/2021   Lab Results  Component Value Date   WBC 6.4 04/18/2021  HGB 13.0 04/18/2021   HCT 37.8 (L) 04/18/2021   MCV 94.3 04/18/2021   PLT 188 04/18/2021   No results found for: "IRON", "TIBC", "FERRITIN"  Attestation Statements:   Reviewed by clinician on day of visit: allergies, medications, problem list, medical history, surgical history, family history, social history, and previous encounter notes.  I, Elnora Morrison, RMA am acting as transcriptionist for Coralie Common, MD. I have reviewed the above  documentation for accuracy and completeness, and I agree with the above. - Coralie Common, MD

## 2022-02-09 DIAGNOSIS — N401 Enlarged prostate with lower urinary tract symptoms: Secondary | ICD-10-CM | POA: Diagnosis not present

## 2022-02-09 DIAGNOSIS — R3915 Urgency of urination: Secondary | ICD-10-CM | POA: Diagnosis not present

## 2022-02-16 ENCOUNTER — Other Ambulatory Visit: Payer: Self-pay | Admitting: Urology

## 2022-02-20 ENCOUNTER — Encounter (INDEPENDENT_AMBULATORY_CARE_PROVIDER_SITE_OTHER): Payer: Self-pay | Admitting: Family Medicine

## 2022-02-20 ENCOUNTER — Ambulatory Visit (INDEPENDENT_AMBULATORY_CARE_PROVIDER_SITE_OTHER): Payer: Medicare Other | Admitting: Family Medicine

## 2022-02-20 VITALS — BP 120/60 | HR 65 | Temp 98.3°F | Ht 72.0 in | Wt 230.0 lb

## 2022-02-20 DIAGNOSIS — E669 Obesity, unspecified: Secondary | ICD-10-CM

## 2022-02-20 DIAGNOSIS — E559 Vitamin D deficiency, unspecified: Secondary | ICD-10-CM

## 2022-02-20 DIAGNOSIS — I10 Essential (primary) hypertension: Secondary | ICD-10-CM

## 2022-02-20 DIAGNOSIS — Z6831 Body mass index (BMI) 31.0-31.9, adult: Secondary | ICD-10-CM | POA: Diagnosis not present

## 2022-03-07 NOTE — Progress Notes (Signed)
Chief Complaint:   OBESITY Colton Cummings is here to discuss his progress with his obesity treatment plan along with follow-up of his obesity related diagnoses. Colton Cummings is on keeping a food journal and adhering to recommended goals of 1900-2000 calories and 120+ grams protein and states he is following his eating plan approximately 75% of the time. Colton Cummings states he is walking 6,000 steps 7 times per week.  Today's visit was #: 4 Starting weight: 255 lbs Starting date: 12/06/2021 Today's weight: 230 lbs Today's date: 02/20/2022 Total lbs lost to date: 25 lbs Total lbs lost since last in-office visit: 7  Interim History: Colton Cummings has not been logging. Has tried to stay more focused on protein intake. He is drinking Atkins shakes as an addition to a meal (or as a snack). Thinks he is getting at 50 grams of protein at supper. Going to his son's for Thanksgiving.  Subjective:   1. Essential hypertension Colton Cummings's blood pressure is well controlled today. Denies chest pain, chest pressure and headache.  2. Vitamin D deficiency Colton Cummings is taking over the counter Vit D. His Vit D level of 63.5.  Assessment/Plan:   1. Essential hypertension Continue with current medications of Chlorthalidone, Cozaar. Follow up with Dr. Sandi Mariscal for further management.  2. Vitamin D deficiency Continue taking over the counter Vit D.  3. Obesity with current BMI of 31.2 Colton Cummings is currently in the action stage of change. As such, his goal is to continue with weight loss efforts. He has agreed to keeping a food journal and adhering to recommended goals of 1900-2000 calories and 120+ grams of protein daily.   Exercise goals: All adults should avoid inactivity. Some physical activity is better than none, and adults who participate in any amount of physical activity gain some health benefits.  Behavioral modification strategies: increasing lean protein intake, meal planning and cooking strategies, and keeping  healthy foods in the home.  Colton Cummings has agreed to follow-up with our clinic in 3 weeks. He was informed of the importance of frequent follow-up visits to maximize his success with intensive lifestyle modifications for his multiple health conditions.   Objective:   Blood pressure 120/60, pulse 65, temperature 98.3 F (36.8 C), height 6' (1.829 m), weight 230 lb (104.3 kg), SpO2 95 %. Body mass index is 31.19 kg/m.  General: Cooperative, alert, well developed, in no acute distress. HEENT: Conjunctivae and lids unremarkable. Cardiovascular: Regular rhythm.  Lungs: Normal work of breathing. Neurologic: No focal deficits.   Lab Results  Component Value Date   CREATININE 0.83 12/06/2021   BUN 17 12/06/2021   NA 143 12/06/2021   K 3.7 12/06/2021   CL 102 12/06/2021   CO2 27 12/06/2021   Lab Results  Component Value Date   ALT 40 12/06/2021   AST 28 12/06/2021   ALKPHOS 57 12/06/2021   BILITOT 0.5 12/06/2021   Lab Results  Component Value Date   HGBA1C 5.3 12/06/2021   Lab Results  Component Value Date   INSULIN 17.0 12/06/2021   Lab Results  Component Value Date   TSH 1.340 12/06/2021   Lab Results  Component Value Date   CHOL 108 12/06/2021   HDL 36 (L) 12/06/2021   LDLCALC 54 12/06/2021   TRIG 92 12/06/2021   Lab Results  Component Value Date   VD25OH 63.5 12/06/2021   Lab Results  Component Value Date   WBC 6.4 04/18/2021   HGB 13.0 04/18/2021   HCT 37.8 (L) 04/18/2021   MCV  94.3 04/18/2021   PLT 188 04/18/2021   No results found for: "IRON", "TIBC", "FERRITIN"  Attestation Statements:   Reviewed by clinician on day of visit: allergies, medications, problem list, medical history, surgical history, family history, social history, and previous encounter notes.  I, Elnora Morrison, RMA am acting as transcriptionist for Coralie Common, MD.  I have reviewed the above documentation for accuracy and completeness, and I agree with the above. - Coralie Common, MD

## 2022-03-10 ENCOUNTER — Encounter: Payer: Self-pay | Admitting: Gastroenterology

## 2022-03-10 ENCOUNTER — Ambulatory Visit: Payer: Medicare Other | Admitting: Gastroenterology

## 2022-03-10 VITALS — BP 128/68 | HR 75 | Ht 72.0 in | Wt 241.1 lb

## 2022-03-10 DIAGNOSIS — K219 Gastro-esophageal reflux disease without esophagitis: Secondary | ICD-10-CM

## 2022-03-10 DIAGNOSIS — Z86010 Personal history of colonic polyps: Secondary | ICD-10-CM

## 2022-03-10 MED ORDER — SUPREP BOWEL PREP KIT 17.5-3.13-1.6 GM/177ML PO SOLN: 1.0000 | ORAL | 0 refills | Status: DC

## 2022-03-10 NOTE — Patient Instructions (Signed)
We have sent the following medications to your pharmacy for you to pick up at your convenience: [place] have been scheduled for a colonoscopy. Please follow written instructions given to you at your visit today.  Please pick up your prep supplies at the pharmacy within the next 1-3 days. If you use inhalers (even only as needed), please bring them with you on the day of your procedure.  Due to recent changes in healthcare laws, you may see the results of your imaging and laboratory studies on MyChart before your provider has had a chance to review them.  We understand that in some cases there may be results that are confusing or concerning to you. Not all laboratory results come back in the same time frame and the provider may be waiting for multiple results in order to interpret others.  Please give [place] 48 hours in order for your provider to thoroughly review all the results before contacting the office for clarification of your results.   Thank you for choosing me and [facility].  Dr. Rush Landmark

## 2022-03-10 NOTE — Progress Notes (Signed)
[facility] VISIT   Primary Care Provider Derinda Late, MD [place] [address] [phone]  Referring Provider Derinda Late, MD [address] [place],  [place] [address] [phone]  Patient Profile: Colton Cummings is a 76 y.o. male with a pmh significant for hypertension Past Medical History:  Diagnosis Date   Acute appendicitis 06/16/2015   Acute lower GI bleeding after colon polypectomy 06/13/2016   Adenomatous colon polyp    since 1999   Allergic rhinitis    since 2010   Anal polyp 2013   Anemia    due to bleeding after colonoscopy   Arthritis 07/29/2017   Back pain    BPH (benign prostatic hyperplasia)    since 2009   Broken ribs 07/2017   left   Cervical spondylosis    [place]    since 2005   COPD (chronic obstructive pulmonary disease) ([place])    since 2010   Cubital tunnel syndrome on left 2015   Dental crowns present 07/29/2017   Depression    since 2006   ED (erectile dysfunction)    since 2005   Enlarged prostate    GERD (gastroesophageal reflux disease) 07/29/2017   Headache    migraines   Hearing loss    HTN (hypertension) 07/29/2017   Hx of migraine headaches    Hyperlipidemia 07/29/2017   Joint pain    Osteoarthritis    generalized   Rheumatoid arthritis ([place])    Vitreous detachment of right eye 2009   Wears glasses 07/29/2017    The patient presents to the [facility] for an evaluation and management of problem(s) noted below:  Problem List No diagnosis found.  History of Present Illness    The patient does/does not take NSAIDs or BC/Goody Powder. Patient has/has not had an EGD. Patient has/has not had a Colonoscopy.  GI Review of Systems Positive as above Negative for  Pyrosis; Reflux; Regurgitation; Dysphagia; Odynophagia; Globus; Post-prandial cough; Nocturnal cough; Nasal regurgitation; Epigastric pain; Nausea; Vomiting; Hematemesis; Jaundice; Change  in Appetite; Early satiety; Abdominal pain; Abdominal bloating; Eructation; Flatulence; Change in BM Frequency; Change in BM Consistency; Constipation; Diarrhea; Incontinence; Urgency; Tenesmus; Hematochezia; Melena  Review of Systems General: Denies fevers/chills/weight loss/night sweats HEENT: Denies oral lesions/sore throat/headaches/visual changes Cardiovascular: Denies chest pain/palpitations Pulmonary: Denies shortness of breath/cough Gastroenterological: See HPI Genitourinary: Denies darkened urine or hematuria Hematological: Denies easy bruising/bleeding Endocrine: Denies temperature intolerance Dermatological: Denies skin changes Psychological: Mood is stable Allergy & Immunology: Denies severe allergic reactions Musculoskeletal: Denies new arthralgias   Medications Current Outpatient Medications  Medication Sig Dispense Refill   acetaminophen (TYLENOL) 500 MG tablet Take 500 mg by mouth every 6 (six) hours as needed.     albuterol (VENTOLIN HFA) 108 (90 Base) MCG/ACT inhaler Inhale 2 puffs into the lungs every 6 (six) hours as needed for wheezing or shortness of breath.     chlorthalidone (HYGROTON) 25 MG tablet Take 25 mg by mouth daily.      cholecalciferol (VITAMIN D) 1000 units tablet Take 1,000 Units by mouth at bedtime.      etanercept (ENBREL) 50 MG/ML injection Inject 50 mg into the skin once a week.     finasteride (PROSCAR) 5 MG tablet Take 5 mg by mouth daily.      FLUTICASONE PROPIONATE NA Place 2 puffs into the nose.     folic acid (FOLVITE) 1 MG tablet Take 1 mg by mouth daily.     ibuprofen (ADVIL,MOTRIN) 200  MG tablet Take 400 mg by mouth 2 (two) times daily.      losartan (COZAAR) 50 MG tablet Take 50 mg by mouth daily.     methotrexate 2.5 MG tablet Take 2.5 mg by mouth once a week. 10 tabs per week     Multiple Vitamin (MULTIVITAMIN WITH MINERALS) TABS tablet Take 1 tablet by mouth daily. Men's 50+     omeprazole (PRILOSEC) 20 MG capsule Take 20 mg by  mouth daily.      simvastatin (ZOCOR) 20 MG tablet Take 20 mg by mouth daily after supper.      SUMAtriptan (IMITREX) 100 MG tablet Take 100 mg by mouth every 2 (two) hours as needed for migraine. May repeat in 2 hours if headache persists or recurs.     tadalafil (CIALIS) 5 MG tablet Take 5 mg by mouth daily as needed for erectile dysfunction.     tamsulosin (FLOMAX) 0.4 MG CAPS capsule Take 0.8 mg by mouth at bedtime.     No current facility-administered medications for this visit.    Allergies No Known Allergies  Histories Past Medical History:  Diagnosis Date   Acute appendicitis 06/16/2015   Acute lower GI bleeding after colon polypectomy 06/13/2016   Adenomatous colon polyp    since 1999   Allergic rhinitis    since 2010   Anal polyp 2013   Anemia    due to bleeding after colonoscopy   Arthritis 07/29/2017   Back pain    BPH (benign prostatic hyperplasia)    since 2009   Broken ribs 07/2017   left   Cervical spondylosis    [place]    since 2005   COPD (chronic obstructive pulmonary disease) ([place])    since 2010   Cubital tunnel syndrome on left 2015   Dental crowns present 07/29/2017   Depression    since 2006   ED (erectile dysfunction)    since 2005   Enlarged prostate    GERD (gastroesophageal reflux disease) 07/29/2017   Headache    migraines   Hearing loss    HTN (hypertension) 07/29/2017   Hx of migraine headaches    Hyperlipidemia 07/29/2017   Joint pain    Osteoarthritis    generalized   Rheumatoid arthritis ([place])    Vitreous detachment of right eye 2009   Wears glasses 07/29/2017   Past Surgical History:  Procedure Laterality Date   CATARACT EXTRACTION W/ INTRAOCULAR LENS  IMPLANT, BILATERAL     COLONOSCOPY     COLONOSCOPY N/A 06/12/2016   Procedure: COLONOSCOPY;  Surgeon: Manus Gunning, MD;  Location: [facility];  Service: Gastroenterology;  Laterality: N/A;   COLONOSCOPY WITH PROPOFOL N/A 04/29/2018   Procedure: COLONOSCOPY WITH  PROPOFOL;  Surgeon: Rush Landmark Telford Nab., MD;  Location: [facility];  Service: Gastroenterology;  Laterality: N/A;  need tp be 60 minutes   HEMORRHOID SURGERY  01/11/2012   Internal hemorrhoid ligation   LAPAROSCOPIC APPENDECTOMY N/A 06/16/2015   Procedure: APPENDECTOMY LAPAROSCOPIC;  Surgeon: Excell Seltzer, MD;  Location: [facility];  Service: General;  Laterality: N/A;   POLYPECTOMY  04/29/2018   Procedure: POLYPECTOMY;  Surgeon: Irving Copas., MD;  Location: [facility];  Service: Gastroenterology;;   RECTAL POLYPECTOMY  01/11/2012   Excision of anal canal mass.   TONSILLECTOMY     ULNAR NERVE TRANSPOSITION Left 03/31/2014   Procedure: DECOMPRESSION  LEFT ULNAR NERVE;  Surgeon: Daryll Brod, MD;  Location: [facility];  Service: Orthopedics;  Laterality: Left;  Social History   Socioeconomic History   Marital status: Married    Spouse name: Not on file   Number of children: 2   Years of education: Not on file   Highest education level: Not on file  Occupational History    Employer: [employer]  Tobacco Use   Smoking status: Former    Types: Cigarettes    Quit date: 01/02/2000    Years since quitting: 22.2   Smokeless tobacco: Never  Vaping Use   Vaping Use: Never used  Substance and Sexual Activity   Alcohol use: Yes    Alcohol/week: 1.0 - 2.0 standard drink of alcohol    Types: 1 - 2 Standard drinks or equivalent per week   Drug use: No   Sexual activity: Not on file  Other Topics Concern   Not on file  Social History Narrative   He is self-employed in the [place] and 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      He is married.  Lives with his wife.  Enjoys [employer] basketball      Former smoker 1-2 drinks of alcohol every 2 months quit smoking 2001 approximately 60-pack-year history   Social Determinants of Radio broadcast assistant Strain: Not on file   Food Insecurity: Not on file  Transportation Needs: Not on file  Physical Activity: Not on file  Stress: Not on file  Social Connections: Not on file  Intimate Partner Violence: Not on file   Family History  Problem Relation Age of Onset   Hypertension Mother    Heart attack Mother 15       died   Other Mother        hx of rheumatic heart disease   Stroke Father    Hypertension Father    Liver disease Sister        liver failure   Hypertension Sister    Diabetes Sister    Hypertension Brother    Bipolar disorder Daughter    Colon cancer Neg Hx    Esophageal cancer Neg Hx    Rectal cancer Neg Hx    Stomach cancer Neg Hx    Inflammatory bowel disease Neg Hx    Pancreatic cancer Neg Hx    I have reviewed his medical, social, and family history in detail and updated the electronic medical record as necessary.    PHYSICAL EXAMINATION  There were no vitals taken for this visit. Wt Readings from Last 3 Encounters:  02/20/22 230 lb (104.3 kg)  01/17/22 237 lb (107.5 kg)  12/20/21 244 lb (110.7 kg)   GEN: NAD, appears stated age, doesn't appear chronically ill PSYCH: Cooperative, without pressured speech EYE: Conjunctivae pink, sclerae anicteric ENT: MMM, without oral ulcers, no erythema or exudates noted NECK: Supple CV: RR without R/Gs  RESP: CTAB posteriorly, without wheezing GI: NABS, soft, NT/ND, without rebound or guarding, no HSM appreciated GU: DRE shows MSK/EXT: _ edema, no palmar erythema SKIN: No jaundice, no spider angiomata, no concerning rashes NEURO:  Alert & Oriented x 3, no focal deficits, no evidence of asterixis   REVIEW OF DATA  I reviewed the following data at the time of this encounter:  GI Procedures and Studies  2020 colonoscopy - Non-thrombosed internal hemorrhoids found on digital rectal exam. - The examined portion of the ileum was normal. - Post mucosectomy scar in the cecum. - Second, post mucosectomy scar in the  cecum with evidence  of polypoid tissue present. Removed. Clips (MR conditional) were placed. - One 8 mm polyp at the hepatic flexure, removed with a hot snare. Resected and retrieved. Clips (MR conditional) were placed. - One 14 mm polyp at the splenic flexure, removed with mucosal resection. Resected and retrieved. Clips (MR conditional) were placed. - One 6 mm polyp in the sigmoid colon, removed with a cold snare. Resected and retrieved. Clip (MR conditional) was placed. - Normal mucosa in the entire examined colon otherwise. - Non-bleeding non-thrombosed internal hemorrhoids.  Pathology Diagnosis 1. Colon, polyp(s), Hepatic Flexture/Splenic Flexture/Sigmoid - TRADITIONAL SERRATED ADENOMA (SIX FRAGMENTS). - NO HIGH GRADE DYSPLASIA OR MALIGNANCY. 2. Colon, polyp(s), Cecal (Multiple) - POLYPOID FRAGMENTS OF BENIGN COLONIC MUCOSA.  Laboratory Studies  ***  Imaging Studies  ***   ASSESSMENT  Colton Cummings is a 76 y.o. male with a pmh significant for The patient is seen today for evaluation and management of:  No diagnosis found.  ***   PLAN  There are no diagnoses linked to this encounter.   No orders of the defined types were placed in this encounter.   New Prescriptions   No medications on file   Modified Medications   No medications on file    Planned Follow Up No follow-ups on file.   Total Time in Face-to-Face and in Coordination of Care for patient including independent/personal interpretation/review of prior testing, medical history, examination, medication adjustment, communicating results with the patient directly, and documentation within the EHR is ***.   Justice Britain, MD [facility] Advanced [facility]oscopy Office # [phone]

## 2022-03-12 ENCOUNTER — Encounter: Payer: Self-pay | Admitting: Gastroenterology

## 2022-03-12 DIAGNOSIS — K219 Gastro-esophageal reflux disease without esophagitis: Secondary | ICD-10-CM | POA: Insufficient documentation

## 2022-03-15 ENCOUNTER — Ambulatory Visit (INDEPENDENT_AMBULATORY_CARE_PROVIDER_SITE_OTHER): Payer: Medicare Other | Admitting: Family Medicine

## 2022-03-15 ENCOUNTER — Encounter (INDEPENDENT_AMBULATORY_CARE_PROVIDER_SITE_OTHER): Payer: Self-pay | Admitting: Family Medicine

## 2022-03-15 VITALS — BP 119/63 | HR 63 | Temp 97.7°F | Ht 72.0 in | Wt 232.0 lb

## 2022-03-15 DIAGNOSIS — E669 Obesity, unspecified: Secondary | ICD-10-CM

## 2022-03-15 DIAGNOSIS — Z6831 Body mass index (BMI) 31.0-31.9, adult: Secondary | ICD-10-CM | POA: Diagnosis not present

## 2022-03-15 DIAGNOSIS — I10 Essential (primary) hypertension: Secondary | ICD-10-CM | POA: Diagnosis not present

## 2022-03-17 DIAGNOSIS — R351 Nocturia: Secondary | ICD-10-CM | POA: Diagnosis not present

## 2022-03-17 DIAGNOSIS — R3915 Urgency of urination: Secondary | ICD-10-CM | POA: Diagnosis not present

## 2022-03-17 DIAGNOSIS — N401 Enlarged prostate with lower urinary tract symptoms: Secondary | ICD-10-CM | POA: Diagnosis not present

## 2022-03-20 DIAGNOSIS — M0579 Rheumatoid arthritis with rheumatoid factor of multiple sites without organ or systems involvement: Secondary | ICD-10-CM | POA: Diagnosis not present

## 2022-03-20 DIAGNOSIS — M1009 Idiopathic gout, multiple sites: Secondary | ICD-10-CM | POA: Diagnosis not present

## 2022-03-20 DIAGNOSIS — M1991 Primary osteoarthritis, unspecified site: Secondary | ICD-10-CM | POA: Diagnosis not present

## 2022-03-20 DIAGNOSIS — Z79899 Other long term (current) drug therapy: Secondary | ICD-10-CM | POA: Diagnosis not present

## 2022-03-22 ENCOUNTER — Encounter (HOSPITAL_BASED_OUTPATIENT_CLINIC_OR_DEPARTMENT_OTHER): Payer: Self-pay | Admitting: Urology

## 2022-03-22 NOTE — Progress Notes (Addendum)
Spoke w/ via phone for pre-op interview--- pt Lab needs dos---- [employer] results------ current EKG in epic/ chart COVID test -----patient states asymptomatic no test needed Arrive at -------  1115 on 03-24-2022 NPO after MN NO Solid Food.  Clear liquids from MN until---  1015 Med rec completed Medications to take morning of surgery ----- prilosec Diabetic medication ----- n/a Patient instructed no nail polish to be worn day of surgery Patient instructed to bring photo id and insurance card day of surgery Patient aware to have Driver (ride ) / caregiver for 24 hours after surgery --- wife, angela Patient Special Instructions ----- reviewed [facility] and visitor guidelines .  Asked to bring rescue inhaler dos Pre-Op special Istructions ----- n/a Patient verbalized understanding of instructions that were given at this phone interview. Patient denies shortness of breath, chest pain, fever, cough at this phone interview.

## 2022-03-24 ENCOUNTER — Ambulatory Visit (HOSPITAL_BASED_OUTPATIENT_CLINIC_OR_DEPARTMENT_OTHER): Payer: Medicare Other | Admitting: Anesthesiology

## 2022-03-24 ENCOUNTER — Observation Stay (HOSPITAL_BASED_OUTPATIENT_CLINIC_OR_DEPARTMENT_OTHER)
Admission: RE | Admit: 2022-03-24 | Discharge: 2022-03-25 | Disposition: A | Discharge status: Home / Self Care | Payer: Medicare Other | Attending: Urology | Admitting: Urology

## 2022-03-24 ENCOUNTER — Encounter (HOSPITAL_BASED_OUTPATIENT_CLINIC_OR_DEPARTMENT_OTHER)
Admission: RE | Disposition: A | Discharge status: Home / Self Care | Payer: Self-pay | Source: Home / Self Care | Attending: Urology

## 2022-03-24 ENCOUNTER — Encounter (HOSPITAL_BASED_OUTPATIENT_CLINIC_OR_DEPARTMENT_OTHER): Payer: Self-pay | Admitting: Urology

## 2022-03-24 ENCOUNTER — Other Ambulatory Visit: Payer: Self-pay

## 2022-03-24 DIAGNOSIS — Z79899 Other long term (current) drug therapy: Secondary | ICD-10-CM | POA: Insufficient documentation

## 2022-03-24 DIAGNOSIS — I10 Essential (primary) hypertension: Secondary | ICD-10-CM | POA: Insufficient documentation

## 2022-03-24 DIAGNOSIS — R3915 Urgency of urination: Secondary | ICD-10-CM | POA: Insufficient documentation

## 2022-03-24 DIAGNOSIS — R351 Nocturia: Secondary | ICD-10-CM | POA: Diagnosis not present

## 2022-03-24 DIAGNOSIS — N401 Enlarged prostate with lower urinary tract symptoms: Principal | ICD-10-CM | POA: Insufficient documentation

## 2022-03-24 DIAGNOSIS — R350 Frequency of micturition: Secondary | ICD-10-CM | POA: Diagnosis not present

## 2022-03-24 DIAGNOSIS — N138 Other obstructive and reflux uropathy: Secondary | ICD-10-CM | POA: Diagnosis present

## 2022-03-24 DIAGNOSIS — Z01818 Encounter for other preprocedural examination: Secondary | ICD-10-CM

## 2022-03-24 DIAGNOSIS — N400 Enlarged prostate without lower urinary tract symptoms: Secondary | ICD-10-CM | POA: Diagnosis not present

## 2022-03-24 HISTORY — DX: Mixed hyperlipidemia: E78.2

## 2022-03-24 HISTORY — DX: Benign prostatic hyperplasia with lower urinary tract symptoms: N40.1

## 2022-03-24 HISTORY — PX: TRANSURETHRAL RESECTION OF PROSTATE: SHX73

## 2022-03-24 HISTORY — DX: Migraine, unspecified, not intractable, without status migrainosus: G43.909

## 2022-03-24 HISTORY — DX: Idiopathic gout, unspecified site: M10.00

## 2022-03-24 HISTORY — DX: Personal history of colonic polyps: Z86.010

## 2022-03-24 HISTORY — DX: Presence of external hearing-aid: Z97.4

## 2022-03-24 HISTORY — DX: Personal history of adenomatous and serrated colon polyps: Z86.0101

## 2022-03-24 LAB — POCT I-STAT, CHEM 8
BUN: 15 mg/dL (ref 8–23)
Calcium, Ion: 1.25 mmol/L (ref 1.15–1.40)
Chloride: 98 mmol/L (ref 98–111)
Creatinine, Ser: 0.6 mg/dL — ABNORMAL LOW (ref 0.61–1.24)
Glucose, Bld: 101 mg/dL — ABNORMAL HIGH (ref 70–99)
HCT: 38 % — ABNORMAL LOW (ref 39.0–52.0)
Hemoglobin: 12.9 g/dL — ABNORMAL LOW (ref 13.0–17.0)
Potassium: 3.3 mmol/L — ABNORMAL LOW (ref 3.5–5.1)
Sodium: 140 mmol/L (ref 135–145)
TCO2: 28 mmol/L (ref 22–32)

## 2022-03-24 SURGERY — TURP (TRANSURETHRAL RESECTION OF PROSTATE)
Anesthesia: General | Site: Prostate

## 2022-03-24 MED ORDER — ONDANSETRON HCL 4 MG/2ML IJ SOLN: 4.0000 mg | Freq: Once | INTRAMUSCULAR | Status: DC | PRN

## 2022-03-24 MED ORDER — ALBUTEROL SULFATE HFA 108 (90 BASE) MCG/ACT IN AERS: 2.0000 | INHALATION_SPRAY | Freq: Four times a day (QID) | RESPIRATORY_TRACT | Status: DC | PRN

## 2022-03-24 MED ORDER — PROPOFOL 500 MG/50ML IV EMUL
INTRAVENOUS | Status: AC
  Filled 2022-03-24: qty 50

## 2022-03-24 MED ORDER — DEXAMETHASONE SODIUM PHOSPHATE 10 MG/ML IJ SOLN
INTRAMUSCULAR | Status: AC
  Filled 2022-03-24: qty 4

## 2022-03-24 MED ORDER — STERILE WATER FOR IRRIGATION IR SOLN
Status: DC | PRN
  Administered 2022-03-24: 1000 mL

## 2022-03-24 MED ORDER — PANTOPRAZOLE SODIUM 40 MG PO TBEC
DELAYED_RELEASE_TABLET | ORAL | Status: AC
  Filled 2022-03-24: qty 1

## 2022-03-24 MED ORDER — ACETAMINOPHEN 500 MG PO TABS
ORAL_TABLET | ORAL | Status: AC
  Filled 2022-03-24: qty 2

## 2022-03-24 MED ORDER — OXYCODONE HCL 5 MG/5ML PO SOLN: 5.0000 mg | Freq: Once | ORAL | Status: DC | PRN

## 2022-03-24 MED ORDER — ZOLPIDEM TARTRATE 5 MG PO TABS: 5.0000 mg | ORAL_TABLET | Freq: Every evening | ORAL | Status: DC | PRN

## 2022-03-24 MED ORDER — CEPHALEXIN 500 MG PO CAPS: 500.0000 mg | ORAL_CAPSULE | Freq: Two times a day (BID) | ORAL | 0 refills | Status: AC

## 2022-03-24 MED ORDER — HYDROCODONE-ACETAMINOPHEN 5-325 MG PO TABS
1.0000 | ORAL_TABLET | ORAL | Status: DC | PRN
  Administered 2022-03-25 (×2): 2 via ORAL

## 2022-03-24 MED ORDER — OXYBUTYNIN CHLORIDE 5 MG PO TABS: 5.0000 mg | ORAL_TABLET | Freq: Three times a day (TID) | ORAL | 1 refills | Status: DC | PRN

## 2022-03-24 MED ORDER — LOSARTAN POTASSIUM 50 MG PO TABS
50.0000 mg | ORAL_TABLET | Freq: Every day | ORAL | Status: DC
  Filled 2022-03-24: qty 1

## 2022-03-24 MED ORDER — FENTANYL CITRATE (PF) 100 MCG/2ML IJ SOLN
INTRAMUSCULAR | Status: AC
  Filled 2022-03-24: qty 2

## 2022-03-24 MED ORDER — CEFAZOLIN SODIUM-DEXTROSE 2-4 GM/100ML-% IV SOLN
INTRAVENOUS | Status: AC
  Filled 2022-03-24: qty 100

## 2022-03-24 MED ORDER — LIDOCAINE HCL (CARDIAC) PF 100 MG/5ML IV SOSY
PREFILLED_SYRINGE | INTRAVENOUS | Status: DC | PRN
  Administered 2022-03-24: 60 mg via INTRAVENOUS

## 2022-03-24 MED ORDER — PROPOFOL 10 MG/ML IV BOLUS
INTRAVENOUS | Status: DC | PRN
  Administered 2022-03-24: 150 mg via INTRAVENOUS

## 2022-03-24 MED ORDER — ONDANSETRON HCL 4 MG/2ML IJ SOLN
INTRAMUSCULAR | Status: AC
  Filled 2022-03-24: qty 8

## 2022-03-24 MED ORDER — PHENYLEPHRINE 80 MCG/ML (10ML) SYRINGE FOR IV PUSH (FOR BLOOD PRESSURE SUPPORT)
PREFILLED_SYRINGE | INTRAVENOUS | Status: AC
  Filled 2022-03-24: qty 30

## 2022-03-24 MED ORDER — PANTOPRAZOLE SODIUM 40 MG PO TBEC
40.0000 mg | DELAYED_RELEASE_TABLET | Freq: Every day | ORAL | Status: DC
  Administered 2022-03-24: 40 mg via ORAL

## 2022-03-24 MED ORDER — ACETAMINOPHEN 500 MG PO TABS
1000.0000 mg | ORAL_TABLET | Freq: Once | ORAL | Status: AC
  Administered 2022-03-24: 1000 mg via ORAL

## 2022-03-24 MED ORDER — CEFAZOLIN SODIUM-DEXTROSE 1-4 GM/50ML-% IV SOLN
1.0000 g | Freq: Three times a day (TID) | INTRAVENOUS | Status: DC
  Administered 2022-03-24 – 2022-03-25 (×2): 1 g via INTRAVENOUS

## 2022-03-24 MED ORDER — LACTATED RINGERS IV SOLN: INTRAVENOUS | Status: DC

## 2022-03-24 MED ORDER — LIDOCAINE HCL (PF) 2 % IJ SOLN
INTRAMUSCULAR | Status: AC
  Filled 2022-03-24: qty 20

## 2022-03-24 MED ORDER — AMISULPRIDE (ANTIEMETIC) 5 MG/2ML IV SOLN: 10.0000 mg | Freq: Once | INTRAVENOUS | Status: DC | PRN

## 2022-03-24 MED ORDER — TRAMADOL HCL 50 MG PO TABS: 50.0000 mg | ORAL_TABLET | Freq: Four times a day (QID) | ORAL | 0 refills | Status: AC | PRN

## 2022-03-24 MED ORDER — OXYBUTYNIN CHLORIDE 5 MG PO TABS: 5.0000 mg | ORAL_TABLET | Freq: Three times a day (TID) | ORAL | Status: DC | PRN

## 2022-03-24 MED ORDER — SODIUM CHLORIDE 0.9 % IR SOLN
Status: DC | PRN
  Administered 2022-03-24 (×8): 3000 mL

## 2022-03-24 MED ORDER — EPHEDRINE SULFATE (PRESSORS) 50 MG/ML IJ SOLN
INTRAMUSCULAR | Status: DC | PRN
  Administered 2022-03-24: 10 mg via INTRAVENOUS

## 2022-03-24 MED ORDER — SODIUM CHLORIDE 0.9 % IV SOLN: INTRAVENOUS | Status: DC

## 2022-03-24 MED ORDER — SODIUM CHLORIDE 0.9 % IR SOLN
3000.0000 mL | Status: DC
  Administered 2022-03-24: 3000 mL

## 2022-03-24 MED ORDER — ONDANSETRON HCL 4 MG/2ML IJ SOLN
INTRAMUSCULAR | Status: DC | PRN
  Administered 2022-03-24: 4 mg via INTRAVENOUS

## 2022-03-24 MED ORDER — DIPHENHYDRAMINE HCL 12.5 MG/5ML PO ELIX: 12.5000 mg | ORAL_SOLUTION | Freq: Four times a day (QID) | ORAL | Status: DC | PRN

## 2022-03-24 MED ORDER — OXYCODONE HCL 5 MG PO TABS: 5.0000 mg | ORAL_TABLET | Freq: Once | ORAL | Status: DC | PRN

## 2022-03-24 MED ORDER — FENTANYL CITRATE (PF) 100 MCG/2ML IJ SOLN
INTRAMUSCULAR | Status: DC | PRN
  Administered 2022-03-24: 50 ug via INTRAVENOUS

## 2022-03-24 MED ORDER — CEFAZOLIN SODIUM-DEXTROSE 2-4 GM/100ML-% IV SOLN
2.0000 g | INTRAVENOUS | Status: AC
  Administered 2022-03-24: 2 g via INTRAVENOUS

## 2022-03-24 MED ORDER — DIPHENHYDRAMINE HCL 50 MG/ML IJ SOLN: 12.5000 mg | Freq: Four times a day (QID) | INTRAMUSCULAR | Status: DC | PRN

## 2022-03-24 MED ORDER — HYDROMORPHONE HCL 1 MG/ML IJ SOLN: 0.5000 mg | INTRAMUSCULAR | Status: DC | PRN

## 2022-03-24 MED ORDER — PHENAZOPYRIDINE HCL 200 MG PO TABS: 200.0000 mg | ORAL_TABLET | Freq: Three times a day (TID) | ORAL | 0 refills | Status: DC | PRN

## 2022-03-24 MED ORDER — PHENYLEPHRINE HCL (PRESSORS) 10 MG/ML IV SOLN
INTRAVENOUS | Status: DC | PRN
  Administered 2022-03-24: 80 ug via INTRAVENOUS

## 2022-03-24 MED ORDER — ACETAMINOPHEN 325 MG PO TABS: 650.0000 mg | ORAL_TABLET | ORAL | Status: DC | PRN

## 2022-03-24 MED ORDER — CEFAZOLIN SODIUM-DEXTROSE 1-4 GM/50ML-% IV SOLN
INTRAVENOUS | Status: AC
  Filled 2022-03-24: qty 50

## 2022-03-24 MED ORDER — SUMATRIPTAN SUCCINATE 100 MG PO TABS: 100.0000 mg | ORAL_TABLET | ORAL | Status: DC | PRN

## 2022-03-24 MED ORDER — ONDANSETRON HCL 4 MG/2ML IJ SOLN: 4.0000 mg | INTRAMUSCULAR | Status: DC | PRN

## 2022-03-24 MED ORDER — HYDROMORPHONE HCL 1 MG/ML IJ SOLN: 0.2500 mg | INTRAMUSCULAR | Status: DC | PRN

## 2022-03-24 MED ORDER — PROPOFOL 1000 MG/100ML IV EMUL
INTRAVENOUS | Status: AC
  Filled 2022-03-24: qty 100

## 2022-03-24 MED ORDER — DEXAMETHASONE SODIUM PHOSPHATE 4 MG/ML IJ SOLN
INTRAMUSCULAR | Status: DC | PRN
  Administered 2022-03-24: 4 mg via INTRAVENOUS

## 2022-03-24 SURGICAL SUPPLY — 27 items
BAG DRAIN URO-CYSTO SKYTR STRL (DRAIN) ×1 IMPLANT
BAG DRN RND TRDRP ANRFLXCHMBR (UROLOGICAL SUPPLIES) ×1
BAG DRN UROCATH (DRAIN) ×1
BAG URINE DRAIN 2000ML AR STRL (UROLOGICAL SUPPLIES) ×1 IMPLANT
BAND INSRT 18 STRL LF DISP RB (MISCELLANEOUS) ×1
BAND RUBBER #18 3X1/16 STRL (MISCELLANEOUS) IMPLANT
CATH FOLEY 3WAY 30CC 22FR (CATHETERS) IMPLANT
CATH FOLEY 3WAY 30CC 24FR (CATHETERS) ×1
CATH URTH STD 24FR FL 3W 2 (CATHETERS) IMPLANT
GLOVE BIO SURGEON STRL SZ7.5 (GLOVE) ×1 IMPLANT
GLOVE BIOGEL PI IND STRL 6 (GLOVE) IMPLANT
GLOVE ECLIPSE 6.0 STRL STRAW (GLOVE) IMPLANT
GOWN STRL REUS W/TWL LRG LVL3 (GOWN DISPOSABLE) IMPLANT
GOWN STRL REUS W/TWL XL LVL3 (GOWN DISPOSABLE) ×1 IMPLANT
HOLDER FOLEY CATH W/STRAP (MISCELLANEOUS) IMPLANT
IV NS IRRIG 3000ML ARTHROMATIC (IV SOLUTION) ×2 IMPLANT
KIT TURNOVER CYSTO (KITS) ×1 IMPLANT
LOOP CUT BIPOLAR 24F LRG (ELECTROSURGICAL) IMPLANT
MANIFOLD NEPTUNE II (INSTRUMENTS) ×1 IMPLANT
PACK CYSTO (CUSTOM PROCEDURE TRAY) ×1 IMPLANT
PIN SAFETY STERILE (MISCELLANEOUS) IMPLANT
SYR 30ML LL (SYRINGE) IMPLANT
SYR TOOMEY IRRIG 70ML (MISCELLANEOUS) ×1
SYRINGE TOOMEY IRRIG 70ML (MISCELLANEOUS) ×1 IMPLANT
TUBE CONNECTING 12X1/4 (SUCTIONS) ×1 IMPLANT
TUBING UROLOGY SET (TUBING) ×1 IMPLANT
WATER STERILE IRR 500ML POUR (IV SOLUTION) IMPLANT

## 2022-03-24 NOTE — Anesthesia Preprocedure Evaluation (Addendum)
Anesthesia Evaluation  Patient identified by MRN, date of birth, ID band Patient awake    Reviewed: Allergy & Precautions, NPO status , Patient's Chart, lab work & pertinent test results  Airway Mallampati: IV  TM Distance: >3 FB Neck ROM: Full    Dental  (+) Teeth Intact, Dental Advisory Given   Pulmonary COPD (uses inhaler almost daily),  COPD inhaler, former smoker Quit smoking 2001   Pulmonary exam normal breath sounds clear to auscultation       Cardiovascular hypertension, Pt. on medications Normal cardiovascular exam Rhythm:Regular Rate:Normal  Echo 2013 normal LVEF, grade 1 diastolic dysfunction, normal valves   Neuro/Psych  Headaches PSYCHIATRIC DISORDERS  Depression       GI/Hepatic Neg liver ROS,GERD  Medicated and Controlled,,Hx GIB    Endo/Other  negative endocrine ROS    Renal/GU negative Renal ROS Bladder dysfunction      Musculoskeletal  (+) Arthritis , Osteoarthritis,    Abdominal   Peds  Hematology negative hematology ROS (+)   Anesthesia Other Findings   Reproductive/Obstetrics negative OB ROS                             Anesthesia Physical Anesthesia Plan  ASA: 2  Anesthesia Plan: General   Post-op Pain Management: Tylenol PO (pre-op)*   Induction: Intravenous  PONV Risk Score and Plan: 3 and Ondansetron, Dexamethasone and Treatment may vary due to age or medical condition  Airway Management Planned: LMA  Additional Equipment: None  Intra-op Plan:   Post-operative Plan: Extubation in OR  Informed Consent: I have reviewed the patients History and Physical, chart, labs and discussed the procedure including the risks, benefits and alternatives for the proposed anesthesia with the patient or authorized representative who has indicated his/her understanding and acceptance.     Dental advisory given  Plan Discussed with: CRNA  Anesthesia Plan Comments:         Anesthesia Quick Evaluation

## 2022-03-24 NOTE — Anesthesia Procedure Notes (Signed)
Procedure Name: LMA Insertion Date/Time: 03/24/2022 1:26 PM  Performed by: Georgeanne Nim, CRNAPre-anesthesia Checklist: Patient identified, Emergency Drugs available, Suction available, Patient being monitored and Timeout performed Patient Re-evaluated:Patient Re-evaluated prior to induction Oxygen Delivery Method: Circle system utilized Preoxygenation: Pre-oxygenation with 100% oxygen Induction Type: IV induction Ventilation: Mask ventilation without difficulty LMA: LMA inserted LMA Size: 5.0 Number of attempts: 1 Placement Confirmation: positive ETCO2 and breath sounds checked- equal and bilateral Tube secured with: Tape Dental Injury: Teeth and Oropharynx as per pre-operative assessment

## 2022-03-24 NOTE — Anesthesia Postprocedure Evaluation (Signed)
Anesthesia Post Note  Patient: Colton Cummings  Procedure(s) Performed: TRANSURETHRAL RESECTION OF THE PROSTATE (TURP) (Prostate)     Patient location during evaluation: PACU Anesthesia Type: General Level of consciousness: awake and alert, oriented and patient cooperative Pain management: pain level controlled Vital Signs Assessment: post-procedure vital signs reviewed and stable Respiratory status: spontaneous breathing, nonlabored ventilation and respiratory function stable Cardiovascular status: blood pressure returned to baseline and stable Postop Assessment: no apparent nausea or vomiting Anesthetic complications: no   No notable events documented.  Last Vitals:  Vitals:   03/24/22 1438 03/24/22 1445  BP: (!) 157/75 (!) 160/74  Pulse: 61 (!) 57  Resp: 14 12  Temp: 36.8 C   SpO2: 99% 100%    Last Pain:  Vitals:   03/24/22 1438  TempSrc:   PainSc: 0-No pain                 [name]

## 2022-03-24 NOTE — H&P (Signed)
Office Visit Report     03/17/2022   --------------------------------------------------------------------------------   Colton Cummings  MRN: [id number]  DOB: [birth year], 76 year old Male  SSN:    PRIMARY CARE:  Marylene Land, MD  PRIMARY CARE FAX:  [phone]  REFERRING:  Glena Norfolk. Lovena Neighbours, MD  PROVIDER:  Ellison Hughs, M.D.  TREATING:  Jiles Crocker, NP  LOCATION:  [facility] [phone]     --------------------------------------------------------------------------------   CC/HPI: Enlarged prostate   Colton Cummings is a 76 year old male with a history of BPH with worsening LUTS.   Last PSA: 1.12 (2023)   -He is currently on tamsulosin 0.8 mg nightly, finasteride 5 mg daily and tadalafil 5 mg daily to address his LUTS  -His main complaint is nocturia x3-4. He also reports daytime urgency and frequency q 1-2 hours along with a fluctuating and weak force of stream  -Currently on chlorthalidone for HTN in the AM  -Drinks 1.5 cups of coffee per day. Denies alcohol intake  -No prior GU surgery, trauma or infections  -His father required simple prostatectomy--no personal/family history of prostate cancer   02/09/22: The patient is here today for a routine follow-up. He continues to take a host of BPH medications and reports stable LUTS. Denies interval UTIs, dysuria or hematuria. Transrectal ultrasound today revealed a prostate volume of approximately 70 cm. He is interested in proceeding with TURP.   03/17/2022: Patient here today for preoperative appointment prior to undergoing TURP with Dr. Lovena Neighbours on 12/15. Patient denies any changes in past medical history, prescription medications taken on daily basis, no interval surgical or procedural intervention. Voiding symptoms remain grossly stable but bothersome. He has had no acute worsening symptomology including absence of any dysuria or gross hematuria. No recent UTI or other infection treatment. He denies any  recent fevers or chills, nausea/vomiting, chest pain or shortness of breath.     ALLERGIES: None   MEDICATIONS: Finasteride 5 mg tablet  Lisinopril 10 mg tablet  Omeprazole 20 mg capsule,delayed release  Tamsulosin Hcl 0.4 mg capsule  Centrum Silver  Chlorthalidone 25 mg tablet  Claritin  Enbrel 50 mg/ml (1 ml) syringe one time weekly  Folic Acid  Ibuprofen PRN  Methotrexate 2.5 mg tablet  Tadalafil 5 mg tablet  Tylenol PRN  Vitamin D3     GU PSH: None     PSH Notes: elbow surgery   NON-GU PSH: None   GU PMH: BPH w/LUTS - 02/09/2022, - 12/26/2021 Urinary Urgency - 02/09/2022, - 12/26/2021 Nocturia - 12/26/2021    NON-GU PMH: Anxiety Arthritis Gout Hypercholesterolemia Hypertension    FAMILY HISTORY: 1 Daughter - Daughter 1 son - Son Heart Disease - Mother kidney stone - Father   SOCIAL HISTORY: Marital Status: Married Preferred Language: English; Ethnicity: Not Hispanic Or Latino; Race: White Current Smoking Status: Patient does not smoke anymore.   Tobacco Use Assessment Completed: Used Tobacco in last 30 days? Has never drank.  Drinks 2 caffeinated drinks per day.    REVIEW OF SYSTEMS:    GU Review Male:   Patient reports frequent urination, hard to postpone urination, get up at night to urinate, stream starts and stops, trouble starting your stream, and have to strain to urinate . Patient denies burning/ pain with urination, leakage of urine, erection problems, and penile pain.  Gastrointestinal (Upper):   Patient denies nausea, vomiting, and indigestion/ heartburn.  Gastrointestinal (Lower):   Patient denies diarrhea and constipation.  Constitutional:   Patient denies fever, night sweats,  weight loss, and fatigue.  Skin:   Patient denies skin rash/ lesion and itching.  Eyes:   Patient denies blurred vision and double vision.  Ears/ Nose/ Throat:   Patient denies sore throat and sinus problems.  Hematologic/Lymphatic:   Patient denies swollen glands and easy  bruising.  Cardiovascular:   Patient denies leg swelling and chest pains.  Respiratory:   Patient denies cough and shortness of breath.  Endocrine:   Patient denies excessive thirst.  Musculoskeletal:   Patient denies back pain and joint pain.  Neurological:   Patient denies headaches and dizziness.  Psychologic:   Patient denies depression and anxiety.   Notes: Updated from previous visit 12/26/2021 with review from patient as noted above.   VITAL SIGNS:      03/17/2022 02:16 PM  Weight 230 lb / 104.33 kg  Height 72 in / 182.88 cm  BP 125/76 mmHg  Pulse 85 /min  BMI 31.2 kg/m   MULTI-SYSTEM PHYSICAL EXAMINATION:    Constitutional: Well-nourished. No physical deformities. Normally developed. Good grooming.  Neck: Neck symmetrical, not swollen. Normal tracheal position.  Respiratory: No labored breathing, no use of accessory muscles.   Cardiovascular: Normal temperature, normal extremity pulses, no swelling, no varicosities.  Skin: No paleness, no jaundice, no cyanosis. No lesion, no ulcer, no rash.  Neurologic / Psychiatric: Oriented to time, oriented to place, oriented to person. No depression, no anxiety, no agitation.  Gastrointestinal: No mass, no tenderness, no rigidity, non obese abdomen.  Musculoskeletal: Normal gait and station of head and neck.     Complexity of Data:  Source Of History:  Patient, Medical Record Summary  Records Review:   Previous Doctor Records, Previous Hospital Records, Previous Patient Records  Urine Test Review:   Urinalysis  Urodynamics Review:   Review Bladder Scan  X-Ray Review: Prostate Ultrasound: Reviewed Films.     03/17/22  Urinalysis  Urine Appearance Clear   Urine Color Yellow   Urine Glucose Neg mg/dL  Urine Bilirubin Neg mg/dL  Urine Ketones Trace mg/dL  Urine Specific Gravity 1.020   Urine Blood Neg ery/uL  Urine pH <=5.0   Urine Protein Neg mg/dL  Urine Urobilinogen 0.2 mg/dL  Urine Nitrites Neg   Urine Leukocyte Esterase  Neg leu/uL   PROCEDURES:          Urinalysis Dipstick Dipstick Cont'd  Color: Yellow Bilirubin: Neg mg/dL  Appearance: Clear Ketones: Trace mg/dL  Specific Gravity: 1.020 Blood: Neg ery/uL  pH: <=5.0 Protein: Neg mg/dL  Glucose: Neg mg/dL Urobilinogen: 0.2 mg/dL    Nitrites: Neg    Leukocyte Esterase: Neg leu/uL    ASSESSMENT:      ICD-10 Details  1 GU:   BPH w/LUTS - N40.1 Chronic, Stable  2   Urinary Urgency - R39.15 Chronic, Stable  3   Nocturia - R35.1 Chronic, Stable   PLAN:           Orders Labs Urine Culture          Schedule Return Visit/Planned Activity: Keep Scheduled Appointment - Follow up MD, Schedule Surgery          Document Letter(s):  Created for Patient: Clinical Summary         Notes:   All questions answered to the best of my ability regarding the upcoming procedure and expected postoperative course with understanding expressed by the patient. Urine culture sent today to serve as a baseline. He will proceed with previously scheduled TURP on 12/15 with Dr. Lovena Neighbours.   -  The risks, benefits and alternatives of cystoscopy with TURP was discussed with the patient. The risks included, but are not limited to, bleeding, urinary tract infection, bladder perforation requiring prolonged catheterization and/or open bladder repair, ureteral injury, ureteral obstruction, urethral stricture disease, new or worsening voiding dysfunction, retrograde ejaculation, MI, CVA, PE, DVT and the inherent risks of general anesthesia. We also discussed the need for Foley catheterization for at least 3 days post-op and the likely need for post-op observation in the hospital following the procedure. The patient voices understanding and wishes to proceed.

## 2022-03-24 NOTE — Transfer of Care (Signed)
Immediate Anesthesia Transfer of Care Note  Patient: Colton Cummings  Procedure(s) Performed: TRANSURETHRAL RESECTION OF THE PROSTATE (TURP) (Prostate)  Patient Location: PACU  Anesthesia Type:General  Level of Consciousness: awake and patient cooperative  Airway & Oxygen Therapy: Patient Spontanous Breathing and Patient connected to nasal cannula oxygen  Post-op Assessment: Report given to RN and Post -op Vital signs reviewed and stable  Post vital signs: Reviewed and stable  Last Vitals:  Vitals Value Taken Time  BP 157/75 03/24/22 1438  Temp    Pulse 61 03/24/22 1439  Resp 12 03/24/22 1439  SpO2 99 % 03/24/22 1439  Vitals shown include unvalidated device data.  Last Pain:  Vitals:   03/24/22 1125  TempSrc: Oral  PainSc: 0-No pain      Patients Stated Pain Goal: 5 (75/10/25 8527)  Complications: No notable events documented.

## 2022-03-24 NOTE — Op Note (Signed)
Operative Note  Preoperative diagnosis:  1.  BPH with bladder outlet obstruction  Postoperative diagnosis: 1.  BPH with bladder outlet obstruction  Procedure(s): 1.  Bipolar TURP  Surgeon: Ellison Hughs, MD  Assistants:  None  Anesthesia:  General  Complications:  None  EBL: 150 mL  Specimens: 1. Prostate chips  Drains/Catheters: 1.  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 male with refractory LUTS despite tamsulosin twice daily, finasteride and tadalafil daily.  He has been consented for the above procedures, voices understanding wishes to proceed.  Description of procedure:  After informed consent was obtained, the patient was brought to the operating room and general anesthesia was administered. The patient was then placed in the dorsolithotomy position and prepped and draped in usual sterile fashion. A timeout was performed. A 23 French rigid cystoscope was then inserted into the urethral meatus and advanced into the bladder under direct vision. A complete bladder survey revealed no intravesical pathology.  Both ureteral orifices were identified and well away from the bladder neck.  The rigid cystoscope was then exchanged for a 26 French resectoscope with a bipolar loop working element.  Starting at the bladder neck and progressing distally to the verumontanum, the prostatic adenoma was systematically resected until a widely patent prostatic urethral channel was created.  All prostate chips were then hand irrigated out of the bladder and sent to pathology for permanent section.  The resectoscope was then removed and exchanged for a 24 [place] three-way Foley catheter.  The three-way Foley catheter was then extensively hand irrigated until the irrigant returned clear to light pink.  The catheter was then  started on continuous bladder irrigation and placed on rubber band traction.  He tolerated the procedure well and was transferred to the postanesthesia unit in stable condition.  Plan:  CBI and traction overnight

## 2022-03-25 DIAGNOSIS — R351 Nocturia: Secondary | ICD-10-CM | POA: Diagnosis not present

## 2022-03-25 DIAGNOSIS — Z79899 Other long term (current) drug therapy: Secondary | ICD-10-CM | POA: Diagnosis not present

## 2022-03-25 DIAGNOSIS — N138 Other obstructive and reflux uropathy: Secondary | ICD-10-CM | POA: Diagnosis not present

## 2022-03-25 DIAGNOSIS — I10 Essential (primary) hypertension: Secondary | ICD-10-CM | POA: Diagnosis not present

## 2022-03-25 DIAGNOSIS — N401 Enlarged prostate with lower urinary tract symptoms: Secondary | ICD-10-CM | POA: Diagnosis not present

## 2022-03-25 DIAGNOSIS — R3915 Urgency of urination: Secondary | ICD-10-CM | POA: Diagnosis not present

## 2022-03-25 DIAGNOSIS — R350 Frequency of micturition: Secondary | ICD-10-CM | POA: Diagnosis not present

## 2022-03-25 LAB — BASIC METABOLIC PANEL
Anion gap: 7 (ref 5–15)
BUN: 16 mg/dL (ref 8–23)
CO2: 29 mmol/L (ref 22–32)
Calcium: 9.2 mg/dL (ref 8.9–10.3)
Chloride: 100 mmol/L (ref 98–111)
Creatinine, Ser: 0.72 mg/dL (ref 0.61–1.24)
GFR, Estimated: >60 mL/min (ref >60)
Glucose, Bld: 119 mg/dL — ABNORMAL HIGH (ref 70–99)
Potassium: 3.2 mmol/L — ABNORMAL LOW (ref 3.5–5.1)
Sodium: 136 mmol/L (ref 135–145)

## 2022-03-25 LAB — HEMOGLOBIN AND HEMATOCRIT, BLOOD
HCT: 34.3 % — ABNORMAL LOW (ref 39.0–52.0)
Hemoglobin: 11.5 g/dL — ABNORMAL LOW (ref 13.0–17.0)

## 2022-03-25 MED ORDER — HYDROCODONE-ACETAMINOPHEN 5-325 MG PO TABS
ORAL_TABLET | ORAL | Status: AC
  Filled 2022-03-25: qty 2

## 2022-03-25 MED ORDER — CEFAZOLIN SODIUM-DEXTROSE 1-4 GM/50ML-% IV SOLN
INTRAVENOUS | Status: AC
  Filled 2022-03-25: qty 50

## 2022-03-25 NOTE — Discharge Summary (Addendum)
Date of admission: 03/24/2022  Date of discharge: 03/25/2022  Admission diagnosis: BPH  Discharge diagnosis: Same  Secondary diagnoses: None  History and Physical: For full details, please see admission history and physical. Briefly, Colton Cummings is a 76 y.o. year old patient with BPH with LUTS who presented for scheduled outpatient TURP.   Hospital Course: Patient underwent TURP with Dr. Lovena Neighbours on 03/24/22. He tolerated the procedure well and post-operatively was taken to the recovery unit for routine post-operative care. He was kept on CBI overnight. By the morning of POD#1, his CBI was clamped and urine remained light pink. His pain was well-controlled and he was tolerating a regular diet. He was ambulating without difficulty. His labs were notable for Hgb 11.5 from 12.9 and Cr was stable at 0.72. He was cleared for discharge from the recovery unit.  Laboratory values:  Recent Labs    03/24/22 1141 03/25/22 0648  HGB 12.9* 11.5*  HCT 38.0* 34.3*   Recent Labs    03/24/22 1141 03/25/22 0648  CREATININE 0.60* 0.72    Disposition: Home  Discharge instruction: The patient was instructed to be ambulatory but told to refrain from heavy lifting, strenuous activity, or driving. Keep catheter to drainage at all times.  Discharge medications:  Allergies as of 03/25/2022   No Known Allergies      Medication List     STOP taking these medications    tadalafil 5 MG tablet Commonly known as: CIALIS       TAKE these medications    acetaminophen 500 MG tablet Commonly known as: TYLENOL Take 500 mg by mouth every 6 (six) hours as needed.   albuterol 108 (90 Base) MCG/ACT inhaler Commonly known as: VENTOLIN HFA Inhale 2 puffs into the lungs every 6 (six) hours as needed for wheezing or shortness of breath.   cephALEXin 500 MG capsule Commonly known as: KEFLEX Take 1 capsule (500 mg total) by mouth 2 (two) times daily for 3 days. Start taking on 03/28/2022    chlorthalidone 25 MG tablet Commonly known as: HYGROTON Take 25 mg by mouth daily.   cholecalciferol 1000 units tablet Commonly known as: VITAMIN D Take 1,000 Units by mouth at bedtime.   Enbrel 50 MG/ML injection Generic drug: etanercept Inject 50 mg into the skin once a week. Friday's   finasteride 5 MG tablet Commonly known as: PROSCAR Take 5 mg by mouth at bedtime.   FLUTICASONE PROPIONATE NA Place 2 puffs into the nose daily.   folic acid 1 MG tablet Commonly known as: FOLVITE Take 1 mg by mouth at bedtime.   ibuprofen 200 MG tablet Commonly known as: ADVIL Take 400 mg by mouth 2 (two) times daily.   losartan 50 MG tablet Commonly known as: COZAAR Take 50 mg by mouth daily.   methotrexate 2.5 MG tablet Commonly known as: RHEUMATREX Take 12.5 mg by mouth 2 (two) times a week. Per pt takes 5 tabs on Monday's and 5 tabs on Tuesday's   multivitamin with minerals Tabs tablet Take 1 tablet by mouth daily. Men's 50+   omeprazole 20 MG capsule Commonly known as: PRILOSEC Take 20 mg by mouth daily.   oxybutynin 5 MG tablet Commonly known as: DITROPAN Take 1 tablet (5 mg total) by mouth every 8 (eight) hours as needed for bladder spasms.   phenazopyridine 200 MG tablet Commonly known as: Pyridium Take 1 tablet (200 mg total) by mouth 3 (three) times daily as needed (for pain with urination).   simvastatin  20 MG tablet Commonly known as: ZOCOR Take 20 mg by mouth daily after supper.   SUMAtriptan 100 MG tablet Commonly known as: IMITREX Take 100 mg by mouth every 2 (two) hours as needed for migraine. May repeat in 2 hours if headache persists or recurs.   Suprep Bowel Prep Kit 17.5-3.13-1.6 GM/177ML Soln Generic drug: Na Sulfate-K Sulfate-Mg Sulf Take 1 kit by mouth as directed. For colonoscopy prep   tamsulosin 0.4 MG Caps capsule Commonly known as: FLOMAX Take 0.8 mg by mouth at bedtime.   traMADol 50 MG tablet Commonly known as: Ultram Take 1 tablet  (50 mg total) by mouth every 6 (six) hours as needed for up to 5 days.        Followup:   Follow-up Information     [facility] Follow up on 03/29/2022.   Why: Catheter removal at [place] information: [place] [phone]

## 2022-03-26 ENCOUNTER — Emergency Department (HOSPITAL_COMMUNITY)
Admission: EM | Admit: 2022-03-26 | Discharge: 2022-03-27 | Disposition: A | Discharge status: Home / Self Care | Payer: Medicare Other | Attending: Emergency Medicine | Admitting: Emergency Medicine

## 2022-03-26 ENCOUNTER — Encounter (HOSPITAL_COMMUNITY): Payer: Self-pay

## 2022-03-26 ENCOUNTER — Other Ambulatory Visit: Payer: Self-pay

## 2022-03-26 DIAGNOSIS — E876 Hypokalemia: Secondary | ICD-10-CM | POA: Insufficient documentation

## 2022-03-26 DIAGNOSIS — R1030 Lower abdominal pain, unspecified: Secondary | ICD-10-CM | POA: Diagnosis present

## 2022-03-26 DIAGNOSIS — T83091A Other mechanical complication of indwelling urethral catheter, initial encounter: Secondary | ICD-10-CM | POA: Diagnosis not present

## 2022-03-26 DIAGNOSIS — R319 Hematuria, unspecified: Secondary | ICD-10-CM | POA: Diagnosis not present

## 2022-03-26 LAB — URINALYSIS, ROUTINE W REFLEX MICROSCOPIC
Bacteria, UA: NONE SEEN
Bilirubin Urine: NEGATIVE
Glucose, UA: NEGATIVE mg/dL
Ketones, ur: NEGATIVE mg/dL
Nitrite: NEGATIVE
Protein, ur: 30 mg/dL — AB
RBC / HPF: >50 RBC/hpf — ABNORMAL HIGH (ref 0–5)
Specific Gravity, Urine: 1.006 (ref 1.005–1.030)
pH: 5 (ref 5.0–8.0)

## 2022-03-26 LAB — BASIC METABOLIC PANEL
Anion gap: 8 (ref 5–15)
BUN: 17 mg/dL (ref 8–23)
CO2: 32 mmol/L (ref 22–32)
Calcium: 9.5 mg/dL (ref 8.9–10.3)
Chloride: 99 mmol/L (ref 98–111)
Creatinine, Ser: 0.88 mg/dL (ref 0.61–1.24)
GFR, Estimated: >60 mL/min (ref >60)
Glucose, Bld: 108 mg/dL — ABNORMAL HIGH (ref 70–99)
Potassium: 2.9 mmol/L — ABNORMAL LOW (ref 3.5–5.1)
Sodium: 139 mmol/L (ref 135–145)

## 2022-03-26 LAB — CBC WITH DIFFERENTIAL/PLATELET
Abs Immature Granulocytes: 0.03 10*3/uL (ref 0.00–0.07)
Basophils Absolute: 0 10*3/uL (ref 0.0–0.1)
Basophils Relative: 0 %
Eosinophils Absolute: 0.6 10*3/uL — ABNORMAL HIGH (ref 0.0–0.5)
Eosinophils Relative: 6 %
HCT: 35.2 % — ABNORMAL LOW (ref 39.0–52.0)
Hemoglobin: 11.9 g/dL — ABNORMAL LOW (ref 13.0–17.0)
Immature Granulocytes: 0 %
Lymphocytes Relative: 15 %
Lymphs Abs: 1.4 10*3/uL (ref 0.7–4.0)
MCH: 32.3 pg (ref 26.0–34.0)
MCHC: 33.8 g/dL (ref 30.0–36.0)
MCV: 95.7 fL (ref 80.0–100.0)
Monocytes Absolute: 1.1 10*3/uL — ABNORMAL HIGH (ref 0.1–1.0)
Monocytes Relative: 12 %
Neutro Abs: 6.3 10*3/uL (ref 1.7–7.7)
Neutrophils Relative %: 67 %
Platelets: 153 10*3/uL (ref 150–400)
RBC: 3.68 MIL/uL — ABNORMAL LOW (ref 4.22–5.81)
RDW: 14.2 % (ref 11.5–15.5)
WBC: 9.3 10*3/uL (ref 4.0–10.5)
nRBC: 0 % (ref 0.0–0.2)

## 2022-03-26 MED ORDER — POTASSIUM CHLORIDE 10 MEQ/100ML IV SOLN
10.0000 meq | INTRAVENOUS | Status: AC
  Administered 2022-03-26 (×2): 10 meq via INTRAVENOUS
  Filled 2022-03-26 (×2): qty 100

## 2022-03-26 MED ORDER — SODIUM CHLORIDE 0.9 % IV BOLUS
1000.0000 mL | Freq: Once | INTRAVENOUS | Status: AC
  Administered 2022-03-26: 1000 mL via INTRAVENOUS

## 2022-03-26 MED ORDER — POTASSIUM CHLORIDE CRYS ER 20 MEQ PO TBCR: 20.0000 meq | EXTENDED_RELEASE_TABLET | Freq: Every day | ORAL | 0 refills | Status: AC

## 2022-03-26 MED ORDER — POTASSIUM CHLORIDE CRYS ER 20 MEQ PO TBCR
40.0000 meq | EXTENDED_RELEASE_TABLET | Freq: Once | ORAL | Status: AC
  Administered 2022-03-26: 40 meq via ORAL
  Filled 2022-03-26: qty 2

## 2022-03-26 NOTE — Discharge Instructions (Signed)
Your potassium is slightly low.  I have prescribed potassium daily for 3 days.  Your Foley was clogged likely from small blood clots.  I recommend that you stay hydrated and will prevent blood clots from forming.  Please follow-up with your urologist on Wednesday  You will need to have a repeat potassium check this week  Return to ER if your Foley is nonfunctioning or if you have severe bladder spasms

## 2022-03-26 NOTE — ED Provider Triage Note (Signed)
Emergency Medicine Provider Triage Evaluation Note  BINH DOTEN , a 76 y.o. male  was evaluated in triage.  Pt complains of catheter issue.  Patient had transurethral resection 03/24/2022, three-way catheter in place.  States that today he is having urine output but it is leaking.  Endorses streaks of pink blood but no frank hematuria.  He is having some spasms but denies any specific pain.  No vomiting, no fevers but endorsing chills at home.  Review of Systems  Per HPI  Physical Exam  BP 138/68 (BP Location: Left Arm)   Pulse 79   Temp 98.6 F (37 C) (Oral)   Resp 18   Ht 6' (1.829 m)   Wt 107.5 kg   SpO2 95%   BMI 32.14 kg/m  Gen:   Awake, no distress   Resp:  Normal effort  MSK:   Moves extremities without difficulty  Other:    Medical Decision Making  Medically screening exam initiated at 7:11 PM.  Appropriate orders placed.  Haze Boyden was informed that the remainder of the evaluation will be completed by another provider, this initial triage assessment does not replace that evaluation, and the importance of remaining in the ED until their evaluation is complete.  Pain is controlled.  Discussed case with Dr. Darl Householder, will do BMP, CBC UA, urine culture and BladderScan patient.   Sherrill Raring, PA-C 03/26/22 1913

## 2022-03-26 NOTE — ED Triage Notes (Addendum)
Patient reports that he began having leaking from the catheter since 1400 today. Patient states he is also having bladder spasms.  Patient states, "No urine is going in the bag."

## 2022-03-26 NOTE — ED Provider Notes (Signed)
[place] DEPT Provider Note   CSN: [id number] Arrival date & time: 03/26/22  1747     History  Chief Complaint  Patient presents with   bladder spasms    Colton Cummings is a 76 y.o. male history of BPH status post TURP on 12/15, here presenting with catheter issue.  Patient states that he just left the hospital yesterday.  He was briefly on CBI per protocol.  He states that he noticed that there is some small clots in his catheter today.  He states that since 2 PM nothing drains in the catheter and he noticed some drainage around the catheter.  Patient has some suprapubic pain afterwards.  Denies any fever.  The history is provided by the patient.       Home Medications Prior to Admission medications   Medication Sig Start Date End Date Taking? Authorizing Provider  acetaminophen (TYLENOL) 500 MG tablet Take 500 mg by mouth every 6 (six) hours as needed.    [provider]  albuterol (VENTOLIN HFA) 108 (90 Base) MCG/ACT inhaler Inhale 2 puffs into the lungs every 6 (six) hours as needed for wheezing or shortness of breath.    [provider]  cephALEXin (KEFLEX) 500 MG capsule Take 1 capsule (500 mg total) by mouth 2 (two) times daily for 3 days. Start taking on 03/28/2022 03/24/22 03/27/22  Ceasar Mons, MD  chlorthalidone (HYGROTON) 25 MG tablet Take 25 mg by mouth daily. 12/08/11   [provider]  cholecalciferol (VITAMIN D) 1000 units tablet Take 1,000 Units by mouth at bedtime.     [provider]  etanercept (ENBREL) 50 MG/ML injection Inject 50 mg into the skin once a week. Friday's    [provider]  finasteride (PROSCAR) 5 MG tablet Take 5 mg by mouth at bedtime. 12/08/11   [provider]  FLUTICASONE PROPIONATE NA Place 2 puffs into the nose daily.    [provider]  folic acid (FOLVITE) 1 MG tablet Take 1 mg by mouth at bedtime.    [provider]   ibuprofen (ADVIL,MOTRIN) 200 MG tablet Take 400 mg by mouth 2 (two) times daily.     [provider]  losartan (COZAAR) 50 MG tablet Take 50 mg by mouth daily.    [provider]  methotrexate 2.5 MG tablet Take 12.5 mg by mouth 2 (two) times a week. Per pt takes 5 tabs on Monday's and 5 tabs on Tuesday's    [provider]  Multiple Vitamin (MULTIVITAMIN WITH MINERALS) TABS tablet Take 1 tablet by mouth daily. Men's 50+    [provider]  omeprazole (PRILOSEC) 20 MG capsule Take 20 mg by mouth daily. 12/08/11   [provider]  oxybutynin (DITROPAN) 5 MG tablet Take 1 tablet (5 mg total) by mouth every 8 (eight) hours as needed for bladder spasms. 03/24/22   Ceasar Mons, MD  phenazopyridine (PYRIDIUM) 200 MG tablet Take 1 tablet (200 mg total) by mouth 3 (three) times daily as needed (for pain with urination). 03/24/22 03/24/23  Ceasar Mons, MD  simvastatin (ZOCOR) 20 MG tablet Take 20 mg by mouth daily after supper. 12/08/11   [provider]  SUMAtriptan (IMITREX) 100 MG tablet Take 100 mg by mouth every 2 (two) hours as needed for migraine. May repeat in 2 hours if headache persists or recurs.    [provider]  SUPREP BOWEL PREP KIT 17.5-3.13-1.6 GM/177ML SOLN Take 1 kit  by mouth as directed. For colonoscopy prep Patient not taking: Reported on 03/22/2022 03/10/22   Mansouraty, Telford Nab., MD  tamsulosin (FLOMAX) 0.4 MG CAPS capsule Take 0.8 mg by mouth at bedtime.    [provider]  traMADol (ULTRAM) 50 MG tablet Take 1 tablet (50 mg total) by mouth every 6 (six) hours as needed for up to 5 days. 03/24/22 03/29/22  Ceasar Mons, MD      Allergies    Patient has no known allergies.    Review of Systems   Review of Systems  Genitourinary:  Positive for difficulty urinating.  All other systems reviewed and are negative.   Physical Exam Updated Vital Signs BP (!) 115/53    Pulse 76   Temp 98.6 F (37 C) (Oral)   Resp 18   Ht 6' (1.829 m)   Wt 107.5 kg   SpO2 93%   BMI 32.14 kg/m  Physical Exam Vitals and nursing note reviewed.  Constitutional:      Appearance: Normal appearance.  HENT:     Head: Normocephalic.     Nose: Nose normal.     Mouth/Throat:     Mouth: Mucous membranes are moist.  Eyes:     Extraocular Movements: Extraocular movements intact.     Pupils: Pupils are equal, round, and reactive to light.  Cardiovascular:     Rate and Rhythm: Normal rate and regular rhythm.     Pulses: Normal pulses.     Heart sounds: Normal heart sounds.  Pulmonary:     Effort: Pulmonary effort is normal.     Breath sounds: Normal breath sounds.  Abdominal:     General: Abdomen is flat. Bowel sounds are normal.     Palpations: Abdomen is soft.  Genitourinary:    Comments: + Suprapubic tenderness.  Patient has a three-way Foley catheter.  There is no urine in the bag Musculoskeletal:     Cervical back: Normal range of motion and neck supple.  Skin:    General: Skin is warm.  Neurological:     General: No focal deficit present.     Mental Status: He is alert and oriented to person, place, and time.  Psychiatric:        Mood and Affect: Mood normal.        Behavior: Behavior normal.     ED Results / Procedures / Treatments   Labs (all labs ordered are listed, but only abnormal results are displayed) Labs Reviewed  BASIC METABOLIC PANEL - Abnormal; Notable for the following components:      Result Value   Potassium 2.9 (*)    Glucose, Bld 108 (*)    All other components within normal limits  CBC WITH DIFFERENTIAL/PLATELET - Abnormal; Notable for the following components:   RBC 3.68 (*)    Hemoglobin 11.9 (*)    HCT 35.2 (*)    Monocytes Absolute 1.1 (*)    Eosinophils Absolute 0.6 (*)    All other components within normal limits  URINALYSIS, ROUTINE W REFLEX MICROSCOPIC - Abnormal; Notable for the following components:   APPearance HAZY  (*)    Hgb urine dipstick MODERATE (*)    Protein, ur 30 (*)    Leukocytes,Ua TRACE (*)    RBC / HPF >50 (*)    All other components within normal limits  URINE CULTURE    EKG None  Radiology No results found.  Procedures Procedures    Bladder irrigation procedure I irrigated the catheter manually  with 60 cc syringes with normal saline.  I was able to aspirate several clots.  The urine cleared up afterwards.  Medications Ordered in ED Medications  potassium chloride 10 mEq in 100 mL IVPB (10 mEq Intravenous New Bag/Given 03/26/22 2203)  sodium chloride 0.9 % bolus 1,000 mL (1,000 mLs Intravenous New Bag/Given 03/26/22 2158)  potassium chloride SA (KLOR-CON M) CR tablet 40 mEq (40 mEq Oral Given 03/26/22 2156)    ED Course/ Medical Decision Making/ A&P                           Medical Decision Making Colton Cummings is a 76 y.o. male here presenting with clogged Foley catheter.  I was able to manually irrigate the catheter.  There was mild blood clots.  Then yellowish urine came out.  Plan to check CBC and BMP and urinalysis.  Will give IV fluids and reassess the catheter.  I discussed with the urologist on-call, Dr. Barbee Cough. He states that if patient's catheter is draining well, patient does not need admission or CBI.  10:52 PM I reviewed patient's labs.  His potassium is 2.9.  I ordered potassium supplementation.  UA showed RBCs but no signs of infection.  Patient also given 1 L IV bolus and his urine remains clear.  Patient has follow-up with urology on Wednesday.  I told him to stay hydrated.  Problems Addressed: Hematuria, unspecified type: acute illness or injury Hypokalemia: acute illness or injury  Amount and/or Complexity of Data Reviewed Labs: ordered. Decision-making details documented in ED Course.  Risk Prescription drug management.    Final Clinical Impression(s) / ED Diagnoses Final diagnoses:  None    Rx / DC Orders ED Discharge Orders      None         Drenda Freeze, MD 03/26/22 2253

## 2022-03-27 LAB — SURGICAL PATHOLOGY

## 2022-03-28 ENCOUNTER — Encounter (HOSPITAL_BASED_OUTPATIENT_CLINIC_OR_DEPARTMENT_OTHER): Payer: Self-pay | Admitting: Urology

## 2022-03-28 LAB — URINE CULTURE: Culture: NO GROWTH

## 2022-03-29 ENCOUNTER — Telehealth (HOSPITAL_BASED_OUTPATIENT_CLINIC_OR_DEPARTMENT_OTHER): Payer: Self-pay | Admitting: Emergency Medicine

## 2022-03-29 NOTE — Telephone Encounter (Signed)
Post ED Visit - Positive Culture Follow-up  Culture report reviewed by antimicrobial stewardship pharmacist: [place] Team '[]'$  Elenor Quinones, Pharm.D. '[]'$  Heide Guile, Pharm.D., BCPS AQ-ID '[]'$  Parks Neptune, Pharm.D., BCPS '[]'$  Alycia Rossetti, Pharm.D., BCPS '[]'$  [place], [place].D., BCPS, AAHIVP '[]'$  Legrand Como, Pharm.D., BCPS, AAHIVP '[]'$  Salome Arnt, PharmD, BCPS '[]'$  Johnnette Gourd, PharmD, BCPS '[]'$  Hughes Better, PharmD, BCPS '[]'$  Leeroy Cha, PharmD '[]'$  Laqueta Linden, PharmD, BCPS '[]'$  Albertina Parr, PharmD  [place] Team '[x]'$  Leodis Sias, PharmD '[]'$  Lindell Spar, PharmD '[]'$  Royetta Asal, PharmD '[]'$  Graylin Shiver, Rph '[]'$  Rema Fendt) Glennon Mac, PharmD '[]'$  Arlyn Dunning, PharmD '[]'$  Netta Cedars, PharmD '[]'$  Dia Sitter, PharmD '[]'$  Leone Haven, PharmD '[]'$  Gretta Arab, PharmD '[]'$  Theodis Shove, PharmD '[]'$  Peggyann Juba, PharmD '[]'$  Reuel Boom, PharmD   Positive urine culture Treated with no significant growth,  no further patient follow-up is required at this time.  Hazle Nordmann 03/29/2022, 12:32 PM

## 2022-03-29 NOTE — Progress Notes (Signed)
Chief Complaint:   OBESITY Colton Cummings is here to discuss his progress with his obesity treatment plan along with follow-up of his obesity related diagnoses. Colton Cummings is on keeping a food journal and adhering to recommended goals of 1900-2000 calories and 120+ grams of protein and states he is following his eating plan approximately 50% of the time. Colton Cummings states he is walking 6,000-7,000 steps 7 times per week.  Today's visit was #: 5 Starting weight: 255 lbs Starting date: 12/06/2021 Today's weight: 232 lbs Today's date: 03/15/2022 Total lbs lost to date: 23 lbs Total lbs lost since last in-office visit: 0  Interim History: Colton Cummings Thanksgiving with family. He does report he ate indulgently. Has potluck in the next few days and 1 Christmas party coming up. No upcoming plans for his birthday.  Subjective:   1. Essential hypertension Colton Cummings's blood pressure controlled today. He is on Cozaar, Chlorthalidone.  Assessment/Plan:   1. Essential hypertension Continue current medications without changes in dose or medications.  2. Obesity with current BMI of 31.5 Colton Cummings is currently in the action stage of change. As such, his goal is to continue with weight loss efforts. He has agreed to keeping a food journal and adhering to recommended goals of 1900-2000 calories and 120+ grams of protein daily.   Exercise goals: As is.  Behavioral modification strategies: increasing lean protein intake, meal planning and cooking strategies, and keeping healthy foods in the home.  Colton Cummings has agreed to follow-up with our clinic in 4 weeks. He was informed of the importance of frequent follow-up visits to maximize his success with intensive lifestyle modifications for his multiple health conditions.   Objective:   Blood pressure 119/63, pulse 63, temperature 97.7 F (36.5 C), height 6' (1.829 m), weight 232 lb (105.2 kg), SpO2 97 %. Body mass index is 31.46 kg/m.  General:  Cooperative, alert, well developed, in no acute distress. HEENT: Conjunctivae and lids unremarkable. Cardiovascular: Regular rhythm.  Lungs: Normal work of breathing. Neurologic: No focal deficits.   Lab Results  Component Value Date   CREATININE 0.88 03/26/2022   BUN 17 03/26/2022   NA 139 03/26/2022   K 2.9 (L) 03/26/2022   CL 99 03/26/2022   CO2 32 03/26/2022   Lab Results  Component Value Date   ALT 40 12/06/2021   AST 28 12/06/2021   ALKPHOS 57 12/06/2021   BILITOT 0.5 12/06/2021   Lab Results  Component Value Date   HGBA1C 5.3 12/06/2021   Lab Results  Component Value Date   INSULIN 17.0 12/06/2021   Lab Results  Component Value Date   TSH 1.340 12/06/2021   Lab Results  Component Value Date   CHOL 108 12/06/2021   HDL 36 (L) 12/06/2021   LDLCALC 54 12/06/2021   TRIG 92 12/06/2021   Lab Results  Component Value Date   VD25OH 63.5 12/06/2021   Lab Results  Component Value Date   WBC 9.3 03/26/2022   HGB 11.9 (L) 03/26/2022   HCT 35.2 (L) 03/26/2022   MCV 95.7 03/26/2022   PLT 153 03/26/2022   No results found for: "IRON", "TIBC", "FERRITIN"  Attestation Statements:   Reviewed by clinician on day of visit: allergies, medications, problem list, medical history, surgical history, family history, social history, and previous encounter notes.  I, Elnora Morrison, RMA am acting as transcriptionist for Coralie Common, MD.  I have reviewed the above documentation for accuracy and completeness, and I agree with the above. - Coralie Common, MD

## 2022-04-20 ENCOUNTER — Encounter (INDEPENDENT_AMBULATORY_CARE_PROVIDER_SITE_OTHER): Payer: Self-pay | Admitting: Family Medicine

## 2022-04-20 ENCOUNTER — Ambulatory Visit (INDEPENDENT_AMBULATORY_CARE_PROVIDER_SITE_OTHER): Payer: Medicare Other | Admitting: Family Medicine

## 2022-04-20 VITALS — BP 112/68 | HR 73 | Temp 98.0°F | Ht 72.0 in | Wt 224.0 lb

## 2022-04-20 DIAGNOSIS — E669 Obesity, unspecified: Secondary | ICD-10-CM

## 2022-04-20 DIAGNOSIS — R739 Hyperglycemia, unspecified: Secondary | ICD-10-CM

## 2022-04-20 DIAGNOSIS — E559 Vitamin D deficiency, unspecified: Secondary | ICD-10-CM | POA: Diagnosis not present

## 2022-04-20 DIAGNOSIS — E876 Hypokalemia: Secondary | ICD-10-CM | POA: Diagnosis not present

## 2022-04-20 DIAGNOSIS — E7849 Other hyperlipidemia: Secondary | ICD-10-CM

## 2022-04-20 DIAGNOSIS — Z6830 Body mass index (BMI) 30.0-30.9, adult: Secondary | ICD-10-CM

## 2022-04-21 LAB — LIPID PANEL WITH LDL/HDL RATIO
Cholesterol, Total: 84 mg/dL — ABNORMAL LOW (ref 100–199)
HDL: 34 mg/dL — ABNORMAL LOW (ref >39)
LDL Chol Calc (NIH): 35 mg/dL (ref 0–99)
LDL/HDL Ratio: 1 ratio (ref 0.0–3.6)
Triglycerides: 66 mg/dL (ref 0–149)
VLDL Cholesterol Cal: 15 mg/dL (ref 5–40)

## 2022-04-21 LAB — HEMOGLOBIN A1C
Est. average glucose Bld gHb Est-mCnc: 114 mg/dL
Hgb A1c MFr Bld: 5.6 % (ref 4.8–5.6)

## 2022-04-21 LAB — BASIC METABOLIC PANEL
BUN/Creatinine Ratio: 29 — ABNORMAL HIGH (ref 10–24)
BUN: 22 mg/dL (ref 8–27)
CO2: 28 mmol/L (ref 20–29)
Calcium: 10.6 mg/dL — ABNORMAL HIGH (ref 8.6–10.2)
Chloride: 100 mmol/L (ref 96–106)
Creatinine, Ser: 0.75 mg/dL — ABNORMAL LOW (ref 0.76–1.27)
Glucose: 106 mg/dL — ABNORMAL HIGH (ref 70–99)
Potassium: 4.1 mmol/L (ref 3.5–5.2)
Sodium: 141 mmol/L (ref 134–144)
eGFR: 94 mL/min/{1.73_m2} (ref >59)

## 2022-04-21 LAB — VITAMIN D 25 HYDROXY (VIT D DEFICIENCY, FRACTURES): Vit D, 25-Hydroxy: 55.1 ng/mL (ref 30.0–100.0)

## 2022-04-21 LAB — INSULIN, RANDOM: INSULIN: 21.2 u[IU]/mL (ref 2.6–24.9)

## 2022-04-26 DIAGNOSIS — N401 Enlarged prostate with lower urinary tract symptoms: Secondary | ICD-10-CM | POA: Diagnosis not present

## 2022-04-26 DIAGNOSIS — R3915 Urgency of urination: Secondary | ICD-10-CM | POA: Diagnosis not present

## 2022-05-01 NOTE — Progress Notes (Signed)
Chief Complaint:   OBESITY Colton Cummings is here to discuss his progress with his obesity treatment plan along with follow-up of his obesity related diagnoses. Colton Cummings is on keeping a food journal and adhering to recommended goals of 1900-2000 calories and 120+ grams of protein and states he is following his eating plan approximately 50% of the time. Colton Cummings states he is walking 6,000 steps 5 times per week.  Today's visit was #: 6 Starting weight: 255 lbs Starting date: 12/06/2021 Today's weight: 224 lbs Today's date: 04/20/2022 Total lbs lost to date: 31 lbs Total lbs lost since last in-office visit: 8  Interim History: Since last appointment Colton Cummings underwent TURP which was complicated by the fact his catheter became dogged and he needed to go back to ED for it to be flushed.  Also had GI virus over the holidays.  Has tried to stay mindful of food choices.  Eating in at lunch.  Not logging so to speak.  No events or travel coming up.  Subjective:   1. Hyperglycemia Last insulin elevated.  Blood sugar at time of TURP elevated.  2. Hypokalemia Potassium level low 1 month ago (when undergoing TURP procedure). Patient is on Chlorthalidone.  3. Other hyperlipidemia On Zocor, last LDL at goal.  HDL decreased with no myalgias.  4. Vitamin D deficiency Notes fatigue.  On 1k IU daily.  Assessment/Plan:   1. Hyperglycemia We will obtain labs today.  - Hemoglobin A1c - Insulin, random  2. Hypokalemia We will obtain labs today.  - Basic Metabolic Panel (BMET)  3. Other hyperlipidemia We will obtain labs today.  - Lipid Panel With LDL/HDL Ratio  4. Vitamin D deficiency We will obtain labs today.  - VITAMIN D 25 Hydroxy (Vit-D Deficiency, Fractures)  5. Obesity with current BMI of 30.4 Colton Cummings is currently in the action stage of change. As such, his goal is to continue with weight loss efforts. He has agreed to keeping a food journal and adhering to recommended goals of  1900-2000 calories and 125+ grams of protein daily.   Exercise goals: Older adults should follow the adult guidelines. When older adults cannot meet the adult guidelines, they should be as physically active as their abilities and conditions will allow.   Colton Cummings is to start resistance training 3 times a week.  Behavioral modification strategies: increasing lean protein intake, meal planning and cooking strategies, keeping healthy foods in the home, planning for success, and keeping a strict food journal.  Colton Cummings has agreed to follow-up with our clinic in 4 weeks. He was informed of the importance of frequent follow-up visits to maximize his success with intensive lifestyle modifications for his multiple health conditions.   Colton Cummings was informed we would discuss his lab results at his next visit unless there is a critical issue that needs to be addressed sooner. Colton Cummings agreed to keep his next visit at the agreed upon time to discuss these results.  Objective:   Blood pressure 112/68, pulse 73, temperature 98 F (36.7 C), height 6' (1.829 m), weight 224 lb (101.6 kg), SpO2 96 %. Body mass index is 30.38 kg/m.  General: Cooperative, alert, well developed, in no acute distress. HEENT: Conjunctivae and lids unremarkable. Cardiovascular: Regular rhythm.  Lungs: Normal work of breathing. Neurologic: No focal deficits.   Lab Results  Component Value Date   CREATININE 0.75 (L) 04/20/2022   BUN 22 04/20/2022   NA 141 04/20/2022   K 4.1 04/20/2022   CL 100 04/20/2022  CO2 28 04/20/2022   Lab Results  Component Value Date   ALT 40 12/06/2021   AST 28 12/06/2021   ALKPHOS 57 12/06/2021   BILITOT 0.5 12/06/2021   Lab Results  Component Value Date   HGBA1C 5.6 04/20/2022   HGBA1C 5.3 12/06/2021   Lab Results  Component Value Date   INSULIN 21.2 04/20/2022   INSULIN 17.0 12/06/2021   Lab Results  Component Value Date   TSH 1.340 12/06/2021   Lab Results  Component Value  Date   CHOL 84 (L) 04/20/2022   HDL 34 (L) 04/20/2022   LDLCALC 35 04/20/2022   TRIG 66 04/20/2022   Lab Results  Component Value Date   VD25OH 55.1 04/20/2022   VD25OH 63.5 12/06/2021   Lab Results  Component Value Date   WBC 9.3 03/26/2022   HGB 11.9 (L) 03/26/2022   HCT 35.2 (L) 03/26/2022   MCV 95.7 03/26/2022   PLT 153 03/26/2022   No results found for: "IRON", "TIBC", "FERRITIN"  Obesity Behavioral Intervention:   Approximately 15 minutes were spent on the discussion below.  ASK: We discussed the diagnosis of obesity with Colton Cummings today and Colton Cummings agreed to give [place] permission to discuss obesity behavioral modification therapy today.  ASSESS: Colton Cummings has the diagnosis of obesity and his BMI today is 30.4. Donie is in the action stage of change.   ADVISE: Colton Cummings was educated on the multiple health risks of obesity as well as the benefit of weight loss to improve his health. He was advised of the need for long term treatment and the importance of lifestyle modifications to improve his current health and to decrease his risk of future health problems.  AGREE: Multiple dietary modification options and treatment options were discussed and Colton Cummings agreed to follow the recommendations documented in the above note.  ARRANGE: Colton Cummings was educated on the importance of frequent visits to treat obesity as outlined per CMS and USPSTF guidelines and agreed to schedule his next follow up appointment today.  Attestation Statements:   Reviewed by clinician on day of visit: allergies, medications, problem list, medical history, surgical history, family history, social history, and previous encounter notes.  I, Elnora Morrison, RMA am acting as transcriptionist for Coralie Common, MD.  I have reviewed the above documentation for accuracy and completeness, and I agree with the above. - Coralie Common, MD

## 2022-05-04 ENCOUNTER — Encounter: Payer: Self-pay | Admitting: Gastroenterology

## 2022-05-12 ENCOUNTER — Ambulatory Visit (AMBULATORY_SURGERY_CENTER): Payer: Medicare Other | Admitting: Gastroenterology

## 2022-05-12 ENCOUNTER — Encounter: Payer: Self-pay | Admitting: Gastroenterology

## 2022-05-12 VITALS — BP 130/62 | HR 58 | Temp 96.6°F | Resp 15 | Ht 72.0 in | Wt 241.0 lb

## 2022-05-12 DIAGNOSIS — D122 Benign neoplasm of ascending colon: Secondary | ICD-10-CM | POA: Diagnosis not present

## 2022-05-12 DIAGNOSIS — Z09 Encounter for follow-up examination after completed treatment for conditions other than malignant neoplasm: Secondary | ICD-10-CM

## 2022-05-12 DIAGNOSIS — D120 Benign neoplasm of cecum: Secondary | ICD-10-CM | POA: Diagnosis not present

## 2022-05-12 DIAGNOSIS — D125 Benign neoplasm of sigmoid colon: Secondary | ICD-10-CM | POA: Diagnosis not present

## 2022-05-12 DIAGNOSIS — D127 Benign neoplasm of rectosigmoid junction: Secondary | ICD-10-CM

## 2022-05-12 DIAGNOSIS — D124 Benign neoplasm of descending colon: Secondary | ICD-10-CM

## 2022-05-12 DIAGNOSIS — D123 Benign neoplasm of transverse colon: Secondary | ICD-10-CM

## 2022-05-12 DIAGNOSIS — Z86010 Personal history of colonic polyps: Secondary | ICD-10-CM

## 2022-05-12 DIAGNOSIS — D128 Benign neoplasm of rectum: Secondary | ICD-10-CM

## 2022-05-12 MED ORDER — SODIUM CHLORIDE 0.9 % IV SOLN: 500.0000 mL | INTRAVENOUS | Status: DC

## 2022-05-12 NOTE — Progress Notes (Unsigned)
Called to room to assist during endoscopic procedure.  Patient ID and intended procedure confirmed with present staff. Received instructions for my participation in the procedure from the performing physician.  

## 2022-05-12 NOTE — Progress Notes (Unsigned)
GASTROENTEROLOGY PROCEDURE H&P NOTE   Primary Care Physician: Derinda Late, MD  HPI: Colton Cummings is a 77 y.o. male who presents for colonoscopy for surveillance in setting of previous tubular adenomas.  Past Medical History:  Diagnosis Date   Allergic rhinitis    since 2010   Benign localized prostatic hyperplasia with lower urinary tract symptoms (LUTS)    urologist--- dr winter   COPD (chronic obstructive pulmonary disease) ([place]) 2013   Coronary artery calcification seen on CT scan 2021   Depression 2006   ED (erectile dysfunction) 2005   GERD (gastroesophageal reflux disease)    H/O adenomatous polyp of colon    History of lower GI bleeding 06/13/2016   acute post colonoscopy polypectomy  in 2014  and 06-13-2016  this one had clippping of bleed   History of posterior vitreous detachment 2009   right eye  per pt resolved   HTN (hypertension) 07/29/2017   Hyperlipidemia, mixed    Idiopathic gout    multiple sites per pt last issues approx 2013   Migraine    Osteoarthritis    multiple sites   Peyronie's disease 2005   Rheumatoid arthritis ([place]) 2020   rheumatologist--- dr aTrudie Reed   Wears glasses    Wears hearing aid in both ears    Past Surgical History:  Procedure Laterality Date   CATARACT EXTRACTION W/ INTRAOCULAR LENS  IMPLANT, BILATERAL Bilateral 2018   COLONOSCOPY N/A 06/12/2016   Procedure: COLONOSCOPY;  Surgeon: Manus Gunning, MD;  Location: [facility];  Service: Gastroenterology;  Laterality: N/A;   COLONOSCOPY WITH PROPOFOL N/A 04/29/2018   Procedure: COLONOSCOPY WITH PROPOFOL;  Surgeon: Rush Landmark Telford Nab., MD;  Location: [place];  Service: Gastroenterology;  Laterality: N/A;  need tp be 60 minutes   LAPAROSCOPIC APPENDECTOMY N/A 06/16/2015   Procedure: APPENDECTOMY LAPAROSCOPIC;  Surgeon: Excell Seltzer, MD;  Location: [facility];  Service: General;  Laterality: N/A;   POLYPECTOMY  04/29/2018   Procedure: POLYPECTOMY;  Surgeon:  Irving Copas., MD;  Location: [place];  Service: Gastroenterology;;   RECTAL EXAM UNDER ANESTHESIA  01/11/2012   by dr gross @ [facility];   excision anal polyp (benign) and per pt internal hemorrhoid ligation   TONSILLECTOMY     child   TRANSURETHRAL RESECTION OF PROSTATE N/A 03/24/2022   Procedure: TRANSURETHRAL RESECTION OF THE PROSTATE (TURP);  Surgeon: Ceasar Mons, MD;  [facility]: [facility];  Service: Urology;  Laterality: N/A;   ULNAR NERVE TRANSPOSITION Left 03/31/2014   Procedure: DECOMPRESSION  LEFT ULNAR NERVE;  Surgeon: Daryll Brod, MD;  Location: [place];  Service: Orthopedics;  Laterality: Left;   Current Outpatient Medications  Medication Sig Dispense Refill   acetaminophen (TYLENOL) 500 MG tablet Take 500 mg by mouth every 6 (six) hours as needed.     albuterol (VENTOLIN HFA) 108 (90 Base) MCG/ACT inhaler Inhale 2 puffs into the lungs every 6 (six) hours as needed for wheezing or shortness of breath.     chlorthalidone (HYGROTON) 25 MG tablet Take 25 mg by mouth daily.     cholecalciferol (VITAMIN D) 1000 units tablet Take 1,000 Units by mouth at bedtime.      etanercept (ENBREL) 50 MG/ML injection Inject 50 mg into the skin once a week. Friday's     finasteride (PROSCAR) 5 MG tablet Take 5 mg by mouth at bedtime.     FLUTICASONE PROPIONATE NA Place 2 puffs into the nose daily.  folic acid (FOLVITE) 1 MG tablet Take 1 mg by mouth at bedtime.     ibuprofen (ADVIL,MOTRIN) 200 MG tablet Take 400 mg by mouth 2 (two) times daily.      losartan (COZAAR) 50 MG tablet Take 50 mg by mouth daily.     methotrexate 2.5 MG tablet Take 12.5 mg by mouth 2 (two) times a week. Per pt takes 5 tabs on Monday's and 5 tabs on Tuesday's     Multiple Vitamin (MULTIVITAMIN WITH MINERALS) TABS tablet Take 1 tablet by mouth daily. Men's 50+     omeprazole (PRILOSEC) 20 MG capsule Take 20 mg by mouth daily.     potassium chloride SA (KLOR-CON M)  20 MEQ tablet Take 1 tablet (20 mEq total) by mouth daily. 3 tablet 0   simvastatin (ZOCOR) 20 MG tablet Take 20 mg by mouth daily after supper.     SUMAtriptan (IMITREX) 100 MG tablet Take 100 mg by mouth every 2 (two) hours as needed for migraine. May repeat in 2 hours if headache persists or recurs.     SUPREP BOWEL PREP KIT 17.5-3.13-1.6 GM/177ML SOLN Take 1 kit by mouth as directed. For colonoscopy prep 354 mL 0   tamsulosin (FLOMAX) 0.4 MG CAPS capsule Take 0.8 mg by mouth at bedtime.     Current Facility-Administered Medications  Medication Dose Route Frequency Provider Last Rate Last Admin   0.9 %  sodium chloride infusion  500 mL Intravenous Continuous Mansouraty, Telford Nab., MD        Current Outpatient Medications:    acetaminophen (TYLENOL) 500 MG tablet, Take 500 mg by mouth every 6 (six) hours as needed., Disp: , Rfl:    albuterol (VENTOLIN HFA) 108 (90 Base) MCG/ACT inhaler, Inhale 2 puffs into the lungs every 6 (six) hours as needed for wheezing or shortness of breath., Disp: , Rfl:    chlorthalidone (HYGROTON) 25 MG tablet, Take 25 mg by mouth daily., Disp: , Rfl:    cholecalciferol (VITAMIN D) 1000 units tablet, Take 1,000 Units by mouth at bedtime. , Disp: , Rfl:    etanercept (ENBREL) 50 MG/ML injection, Inject 50 mg into the skin once a week. Friday's, Disp: , Rfl:    finasteride (PROSCAR) 5 MG tablet, Take 5 mg by mouth at bedtime., Disp: , Rfl:    FLUTICASONE PROPIONATE NA, Place 2 puffs into the nose daily., Disp: , Rfl:    folic acid (FOLVITE) 1 MG tablet, Take 1 mg by mouth at bedtime., Disp: , Rfl:    ibuprofen (ADVIL,MOTRIN) 200 MG tablet, Take 400 mg by mouth 2 (two) times daily. , Disp: , Rfl:    losartan (COZAAR) 50 MG tablet, Take 50 mg by mouth daily., Disp: , Rfl:    methotrexate 2.5 MG tablet, Take 12.5 mg by mouth 2 (two) times a week. Per pt takes 5 tabs on Monday's and 5 tabs on Tuesday's, Disp: , Rfl:    Multiple Vitamin (MULTIVITAMIN WITH MINERALS) TABS  tablet, Take 1 tablet by mouth daily. Men's 50+, Disp: , Rfl:    omeprazole (PRILOSEC) 20 MG capsule, Take 20 mg by mouth daily., Disp: , Rfl:    potassium chloride SA (KLOR-CON M) 20 MEQ tablet, Take 1 tablet (20 mEq total) by mouth daily., Disp: 3 tablet, Rfl: 0   simvastatin (ZOCOR) 20 MG tablet, Take 20 mg by mouth daily after supper., Disp: , Rfl:    SUMAtriptan (IMITREX) 100 MG tablet, Take 100 mg by mouth every 2 (two) hours  as needed for migraine. May repeat in 2 hours if headache persists or recurs., Disp: , Rfl:    SUPREP BOWEL PREP KIT 17.5-3.13-1.6 GM/177ML SOLN, Take 1 kit by mouth as directed. For colonoscopy prep, Disp: 354 mL, Rfl: 0   tamsulosin (FLOMAX) 0.4 MG CAPS capsule, Take 0.8 mg by mouth at bedtime., Disp: , Rfl:   Current Facility-Administered Medications:    0.9 %  sodium chloride infusion, 500 mL, Intravenous, Continuous, Mansouraty, Telford Nab., MD No Known Allergies Family History  Problem Relation Age of Onset   Hypertension Mother    Heart attack Mother 48       died   Other Mother        hx of rheumatic heart disease   Stroke Father    Hypertension Father    Liver disease Sister        liver failure   Hypertension Sister    Diabetes Sister    Hypertension Brother    Bipolar disorder Daughter    Colon cancer Neg Hx    Esophageal cancer Neg Hx    Rectal cancer Neg Hx    Stomach cancer Neg Hx    Inflammatory bowel disease Neg Hx    Pancreatic cancer Neg Hx    Social History   Socioeconomic History   Marital status: Married    Spouse name: Not on file   Number of children: 2   Years of education: Not on file   Highest education level: Not on file  Occupational History    Employer: [employer]  Tobacco Use   Smoking status: Former    Years: 35.00    Types: Cigarettes    Quit date: 01/02/2000    Years since quitting: 22.3   Smokeless tobacco: Never  Vaping Use   Vaping Use: Never used  Substance and Sexual Activity   Alcohol use:  Not Currently   Drug use: Never   Sexual activity: Not on file  Other Topics Concern   Not on file  Social History Narrative   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      He is married.  Lives with his wife.  Enjoys [employer] basketball      Former smoker 1-2 drinks of alcohol every 2 months quit smoking 2001 approximately 60-pack-year history   Social Determinants of Radio broadcast assistant Strain: Not on file  Food Insecurity: Not on file  Transportation Needs: Not on file  Physical Activity: Not on file  Stress: Not on file  Social Connections: Not on file  Intimate Partner Violence: Not on file    Physical Exam: Today's Vitals   05/12/22 1245  BP: 131/65  Pulse: 62  Temp: (!) 96.6 F (35.9 C)  SpO2: 96%  Weight: 241 lb (109.3 kg)  Height: 6' (1.829 m)   Body mass index is 32.69 kg/m. GEN: NAD EYE: Sclerae anicteric ENT: MMM CV: Non-tachycardic GI: Soft, NT/ND NEURO:  Alert & Oriented x 3  Lab Results: No results for input(s): "WBC", "HGB", "HCT", "PLT" in the last 72 hours. BMET No results for input(s): "NA", "K", "CL", "CO2", "GLUCOSE", "BUN", "CREATININE", "CALCIUM" in the last 72 hours. LFT No results for input(s): "PROT", "ALBUMIN", "AST", "ALT", "ALKPHOS", "BILITOT", "BILIDIR", "IBILI" in the last 72 hours. PT/INR No results for input(s): "LABPROT", "INR" in the last 72 hours.   Impression / Plan: This  is a 77 y.o.male who presents for colonoscopy for surveillance in setting of previous tubular adenomas.  The risks and benefits of endoscopic evaluation/treatment were discussed with the patient and/or family; these include but are not limited to the risk of perforation, infection, bleeding, missed lesions, lack of diagnosis, severe illness requiring hospitalization, as well as anesthesia and sedation related illnesses.  The  patient's history has been reviewed, patient examined, no change in status, and deemed stable for procedure.  The patient and/or family is agreeable to proceed.    Justice Britain, MD [facility] Advanced [facility]oscopy Office # [phone]

## 2022-05-12 NOTE — Progress Notes (Unsigned)
A and O x3. Report to RN. Tolerated MAC anesthesia well. 

## 2022-05-12 NOTE — Op Note (Signed)
[facility] Patient Name: Colton Cummings Procedure Date: 05/12/2022 1:32 PM MRN: [id number] Endoscopist: Justice Britain , MD, [id number] Age: 77 Referring MD:  Date of Birth: 1945-05-21 Gender: Male Account #: [id number] Procedure:                Colonoscopy Indications:              Surveillance: Personal history of adenomatous                            polyps on last colonoscopy > 3 years ago Medicines:                Monitored Anesthesia Care Procedure:                Pre-Anesthesia Assessment:                           - Prior to the procedure, a History and Physical                            was performed, and patient medications and                            allergies were reviewed. The patient's tolerance of                            previous anesthesia was also reviewed. The risks                            and benefits of the procedure and the sedation                            options and risks were discussed with the patient.                            All questions were answered, and informed consent                            was obtained. Prior Anticoagulants: The patient has                            taken no anticoagulant or antiplatelet agents                            except for NSAID medication. ASA Grade Assessment:                            II - A patient with mild systemic disease. After                            reviewing the risks and benefits, the patient was                            deemed in satisfactory condition to undergo the  procedure.                           After obtaining informed consent, the colonoscope                            was passed under direct vision. Throughout the                            procedure, the patient's blood pressure, pulse, and                            oxygen saturations were monitored continuously. The                            CF [id number] #[id number] was introduced through  the anus                            and advanced to the 3 cm into the ileum. The                            colonoscopy was performed without difficulty. The                            patient tolerated the procedure. The quality of the                            bowel preparation was adequate. The terminal ileum,                            ileocecal valve, appendiceal orifice, and rectum                            were photographed. Scope In: 1:42:48 PM Scope Out: 2:35:11 PM Scope Withdrawal Time: 0 hours 47 minutes 37 seconds  Total Procedure Duration: 0 hours 52 minutes 23 seconds  Findings:                 The digital rectal exam findings include                            hemorrhoids. Pertinent negatives include no                            palpable rectal lesions.                           A moderate amount of semi-liquid stool was found in                            the entire colon, interfering with visualization.                            Lavage of the area was performed using copious  amounts, resulting in clearance with adequate                            visualization.                           The colon (entire examined portion) was                            significantly redundant.                           The terminal ileum appeared normal.                           A 19 mm polyp was found on the colonic side of the                            ileocecal valve. The polyp was semi-sessile.                            Preparations were made for mucosal resection.                            Demarcation of the lesion was performed with                            high-definition white light and narrow band imaging                            to clearly identify the boundaries of the lesion.                            Saline was injected to raise the lesion. Piecemeal                            cold mucosal resection using a snare was performed.                             Resection and retrieval were complete. Resected                            tissue margins were examined and clear of polyp                            tissue.                           17, sessile polyps were found in the rectum (1),                            recto-sigmoid colon (2), sigmoid colon (1),                            descending colon (2), transverse colon (  4), hepatic                            flexure (2), ascending colon (3) and cecum (2). The                            polyps were 1 to 12 mm in size. These polyps were                            removed with a cold snare. Resection and retrieval                            were complete.                           Two sessile polyps were found in the hepatic                            flexure. The polyps were 14 to 16 mm in size. These                            polyps were removed with a cold snare. Resection                            and retrieval were complete.                           Multiple small-mouthed diverticula were found in                            the recto-sigmoid colon and sigmoid colon.                           Non-bleeding non-thrombosed internal hemorrhoids                            were found during retroflexion, during perianal                            exam and during digital exam. The hemorrhoids were                            Grade III (internal hemorrhoids that prolapse but                            require manual reduction). Complications:            No immediate complications. Estimated Blood Loss:     Estimated blood loss was minimal. Impression:               - Hemorrhoids found on digital rectal exam.                           - Stool in the entire examined colon - lavaged with  adequate visualization.                           - Redundant colon.                           - The examined portion of the ileum was normal.                            - One 19 mm polyp on the colonic side of the                            ileocecal valve, removed with piecemeal cold                            mucosal resection. Resected and retrieved.                           - Two 14 to 16 mm polyps at the hepatic flexure,                            removed piecemeal using a cold biopsy forceps.                            Resected and retrieved.                           - 27, 1 to 12 mm polyps in the rectum, at the                            recto-sigmoid colon, in the sigmoid colon, in the                            descending colon, in the transverse colon, at the                            hepatic flexure, in the ascending colon and in the                            cecum, removed with a cold snare. Resected and                            retrieved.                           - Diverticulosis in the recto-sigmoid colon and in                            the sigmoid colon.                           - Non-bleeding non-thrombosed internal hemorrhoids. Recommendation:           - The patient will be observed post-procedure,  until all discharge criteria are met.                           - Discharge patient to home.                           - Patient has a contact number available for                            emergencies. The signs and symptoms of potential                            delayed complications were discussed with the                            patient. Return to normal activities tomorrow.                            Written discharge instructions were provided to the                            patient.                           - High fiber diet.                           - Use FiberCon 1-2 tablets PO daily.                           - Minimize nonsteroidals if possible for 1 week to                            decrease post interventional bleeding risk.                           - Await pathology results.                            - Repeat colonoscopy within the next 1 year for                            surveillance.                           - Genetics referral can be considered based on                            number of polyps noted. This will have more impact                            for the patient's family per se.                           - The findings and recommendations were discussed  with the patient.                           - The findings and recommendations were discussed                            with the patient's family. Justice Britain, MD 05/12/2022 2:47:07 PM

## 2022-05-12 NOTE — Progress Notes (Unsigned)
Pt's states no medical or surgical changes since previsit or office visit. 

## 2022-05-12 NOTE — Patient Instructions (Addendum)
Resume your medications today.  Use fiber con 1 tablet daily per Dr Jerilynn Mages.  Minimize your antisteroidals for 1 week.  Try to avoid your methotrexate for 3-4 days or 1 week. Repeat colonoscopy in 1 year. Be sure that your children get colonoscopies.  YOU HAD AN ENDOSCOPIC PROCEDURE TODAY AT [facility]:   Refer to the procedure report that was given to you for any specific questions about what was found during the examination.  If the procedure report does not answer your questions, please call your gastroenterologist to clarify.  If you requested that your care partner not be given the details of your procedure findings, then the procedure report has been included in a sealed envelope for you to review at your convenience later.  YOU SHOULD EXPECT: Some feelings of bloating in the abdomen. Passage of more gas than usual.  Walking can help get rid of the air that was put into your GI tract during the procedure and reduce the bloating. If you had a lower endoscopy (such as a colonoscopy or flexible sigmoidoscopy) you may notice spotting of blood in your stool or on the toilet paper. If you underwent a bowel prep for your procedure, you may not have a normal bowel movement for a few days.  Please Note:  You might notice some irritation and congestion in your nose or some drainage.  This is from the oxygen used during your procedure.  There is no need for concern and it should clear up in a day or so.  SYMPTOMS TO REPORT IMMEDIATELY:  Following lower endoscopy (colonoscopy or flexible sigmoidoscopy):  Excessive amounts of blood in the stool  Significant tenderness or worsening of abdominal pains  Swelling of the abdomen that is new, acute  Fever of 100F or higher   Black, tarry-looking stools  For urgent or emergent issues, a gastroenterologist can be reached at any hour by calling [phone]. Do not use MyChart messaging for urgent concerns.    DIET:  We do recommend a small meal  at first, but then you may proceed to your regular diet.  Drink plenty of fluids but you should avoid alcoholic beverages for 24 hours.  Try to eat a high fiber diet, and drink plenty of water.  ACTIVITY:  You should plan to take it easy for the rest of today and you should NOT DRIVE or use heavy machinery until tomorrow (because of the sedation medicines used during the test).    FOLLOW UP: Our staff will call the number listed on your records the next business day following your procedure.  We will call around 7:15- 8:00 am to check on you and address any questions or concerns that you may have regarding the information given to you following your procedure. If we do not reach you, we will leave a message.     If any biopsies were taken you will be contacted by phone or by letter within the next 1-3 weeks.  Please call us at [phone] if you have not heard about the biopsies in 3 weeks.    SIGNATURES/CONFIDENTIALITY: You and/or your care partner have signed paperwork which will be entered into your electronic medical record.  These signatures attest to the fact that that the information above on your After Visit Summary has been reviewed and is understood.  Full responsibility of the confidentiality of this discharge information lies with you and/or your care-partner.

## 2022-05-15 ENCOUNTER — Telehealth: Payer: Self-pay

## 2022-05-15 NOTE — Telephone Encounter (Signed)
Passing air expected, relatively long procedure due to multiple polyps.  Reviewed colonoscopy report but haven't seen patient before to know best long term management of his internal hemorrhoids.  Recommend over the counter preparation H suppository twice daily, and he needs an appointment with Dr. Rush Landmark when he returns to the office to discuss further.  - HD

## 2022-05-15 NOTE — Telephone Encounter (Signed)
  Follow up Call-     05/12/2022   12:43 PM  Call back number  Post procedure Call Back phone  # [phone]  Permission to leave phone message Yes     Patient questions:  Do you have a fever, pain , or abdominal swelling? No. Pain Score  0 *  Have you tolerated food without any problems? Yes.    Have you been able to return to your normal activities? Yes.    Do you have any questions about your discharge instructions: Diet   No. Medications  No. Follow up visit  No.  Do you have questions or concerns about your Care? Yes.    Patient states he is still passing more air than usual, I see on the procedure report that his "entire colon was significantly redundant" which I told patient could be contributing to increased difficulty passing all of the air, patient denies any bleeding. Patient also states his hemorrhoids are very painful (no bleeding noted) and requests if there is anything to be done regarding this (rectal cream?, candidate for banding?). I told patient I would send a note to the on-call doctor as Dr. Rush Landmark is currently out of town, for advisement. Patient verbalizes understanding.  Actions: * If pain score is 4 or above: No action needed, pain <4.

## 2022-05-15 NOTE — Telephone Encounter (Signed)
After receiving advisement from Dr. Loletha Carrow (doc on call today), I called patient back and recommended he try Preparation suppository twice daily and follow up with Dr. Rush Landmark in his office for further evaluation if this does not help. Patient is agreeable to this and verbalizes understanding.

## 2022-05-18 DIAGNOSIS — M79645 Pain in left finger(s): Secondary | ICD-10-CM | POA: Diagnosis not present

## 2022-05-18 DIAGNOSIS — S61012A Laceration without foreign body of left thumb without damage to nail, initial encounter: Secondary | ICD-10-CM | POA: Diagnosis not present

## 2022-05-18 DIAGNOSIS — W268XXA Contact with other sharp object(s), not elsewhere classified, initial encounter: Secondary | ICD-10-CM | POA: Diagnosis not present

## 2022-05-22 ENCOUNTER — Ambulatory Visit (INDEPENDENT_AMBULATORY_CARE_PROVIDER_SITE_OTHER): Payer: Medicare Other | Admitting: Family Medicine

## 2022-05-22 ENCOUNTER — Encounter (INDEPENDENT_AMBULATORY_CARE_PROVIDER_SITE_OTHER): Payer: Self-pay | Admitting: Family Medicine

## 2022-05-22 VITALS — BP 111/65 | HR 62 | Temp 98.2°F | Ht 72.0 in | Wt 228.0 lb

## 2022-05-22 DIAGNOSIS — E7849 Other hyperlipidemia: Secondary | ICD-10-CM

## 2022-05-22 DIAGNOSIS — E669 Obesity, unspecified: Secondary | ICD-10-CM

## 2022-05-22 DIAGNOSIS — R739 Hyperglycemia, unspecified: Secondary | ICD-10-CM

## 2022-05-22 DIAGNOSIS — Z6830 Body mass index (BMI) 30.0-30.9, adult: Secondary | ICD-10-CM | POA: Diagnosis not present

## 2022-05-22 NOTE — Progress Notes (Deleted)
Patient recognizes the last few weeks he has had more trouble following meal plan.  He had a colonoscopy since last visit and states after that he has had a hard time getting the nutritious food in.  Has incorporated more bread and french fries instead of fruit.  Feels like he may be lacking in motivation.  No upcoming events or activities.  Has implemented some activity in but was not as consistent as he wanted to be.

## 2022-05-24 DIAGNOSIS — D649 Anemia, unspecified: Secondary | ICD-10-CM | POA: Diagnosis not present

## 2022-05-24 DIAGNOSIS — E782 Mixed hyperlipidemia: Secondary | ICD-10-CM | POA: Diagnosis not present

## 2022-05-26 ENCOUNTER — Encounter: Payer: Self-pay | Admitting: Gastroenterology

## 2022-06-01 NOTE — Progress Notes (Signed)
Chief Complaint:   OBESITY Colton Cummings is here to discuss his progress with his obesity treatment plan along with follow-up of his obesity related diagnoses. [name] is on keeping a food journal and adhering to recommended goals of 1900-2000 calories and 125 grams of protein and states he is following his eating plan approximately 40% of the time. [name] states he is normal walking.   Today's visit was #: 7 Starting weight: 255 lbs Starting date: 12/06/2021 Today's weight: 228 lbs Today's date: 05/22/2022 Total lbs lost to date: 27 lbs Total lbs lost since last in-office visit: 0  Interim History: [name] recognizes the last few weeks he has had more trouble following meal plan.  He had a colonoscopy since last visit and states after that he has had a hard time getting the nutritious food in.  Has incorporated more bread and french fries instead of fruit.  Feels like he may be lacking in motivation.  No upcoming events or activities.  Has implemented some activity in but was not as consistent as he wanted to be.  Subjective:   1. Hyperglycemia Labs discussed during visit today.  A1c increased to 5.6 from 5.3.  Insulin increased.  Not on medications.  2. Other hyperlipidemia Labs discussed during visit today.  LDL well-controlled at 35.  HDL still decreased at 34.  Assessment/Plan:   1. Hyperglycemia Will follow-up with labs in 3 months.  2. Other hyperlipidemia Will repeat labs in 3 months.  3. Obesity with current BMI of 30.9 [name] is currently in the action stage of change. As such, his goal is to continue with weight loss efforts. He has agreed to keeping a food journal and adhering to recommended goals of 1900-2000 calories and 125+ grams of protein daily.   Exercise goals: No exercise has been prescribed at this time.  Behavioral modification strategies: increasing lean protein intake, meal planning and cooking strategies, keeping healthy foods in the home, and planning  for success.  [name] has agreed to follow-up with our clinic in 4 weeks. He was informed of the importance of frequent follow-up visits to maximize his success with intensive lifestyle modifications for his multiple health conditions.   Objective:   Blood pressure 111/65, pulse 62, temperature 98.2 F (36.8 C), height 6' (1.829 m), weight 228 lb (103.4 kg), SpO2 97 %. Body mass index is 30.92 kg/m.  General: Cooperative, alert, well developed, in no acute distress. HEENT: Conjunctivae and lids unremarkable. Cardiovascular: Regular rhythm.  Lungs: Normal work of breathing. Neurologic: No focal deficits.   Lab Results  Component Value Date   CREATININE 0.75 (L) 04/20/2022   BUN 22 04/20/2022   NA 141 04/20/2022   K 4.1 04/20/2022   CL 100 04/20/2022   CO2 28 04/20/2022   Lab Results  Component Value Date   ALT 40 12/06/2021   AST 28 12/06/2021   ALKPHOS 57 12/06/2021   BILITOT 0.5 12/06/2021   Lab Results  Component Value Date   HGBA1C 5.6 04/20/2022   HGBA1C 5.3 12/06/2021   Lab Results  Component Value Date   INSULIN 21.2 04/20/2022   INSULIN 17.0 12/06/2021   Lab Results  Component Value Date   TSH 1.340 12/06/2021   Lab Results  Component Value Date   CHOL 84 (L) 04/20/2022   HDL 34 (L) 04/20/2022   LDLCALC 35 04/20/2022   TRIG 66 04/20/2022   Lab Results  Component Value Date   VD25OH 55.1 04/20/2022   VD25OH 63.5 12/06/2021  Lab Results  Component Value Date   WBC 9.3 03/26/2022   HGB 11.9 (L) 03/26/2022   HCT 35.2 (L) 03/26/2022   MCV 95.7 03/26/2022   PLT 153 03/26/2022   No results found for: "IRON", "TIBC", "FERRITIN"  Attestation Statements:   Reviewed by clinician on day of visit: allergies, medications, problem list, medical history, surgical history, family history, social history, and previous encounter notes.  I, Elnora Morrison, RMA am acting as transcriptionist for Coralie Common, MD.  I have reviewed the above  documentation for accuracy and completeness, and I agree with the above. - Coralie Common, MD

## 2022-06-02 DIAGNOSIS — E782 Mixed hyperlipidemia: Secondary | ICD-10-CM | POA: Diagnosis not present

## 2022-06-19 ENCOUNTER — Ambulatory Visit (INDEPENDENT_AMBULATORY_CARE_PROVIDER_SITE_OTHER): Payer: Medicare Other | Admitting: Family Medicine

## 2022-06-19 DIAGNOSIS — M1009 Idiopathic gout, multiple sites: Secondary | ICD-10-CM | POA: Diagnosis not present

## 2022-06-19 DIAGNOSIS — R5383 Other fatigue: Secondary | ICD-10-CM | POA: Diagnosis not present

## 2022-06-19 DIAGNOSIS — M0579 Rheumatoid arthritis with rheumatoid factor of multiple sites without organ or systems involvement: Secondary | ICD-10-CM | POA: Diagnosis not present

## 2022-06-26 ENCOUNTER — Ambulatory Visit (INDEPENDENT_AMBULATORY_CARE_PROVIDER_SITE_OTHER): Payer: Medicare Other | Admitting: Family Medicine

## 2022-06-26 ENCOUNTER — Encounter (INDEPENDENT_AMBULATORY_CARE_PROVIDER_SITE_OTHER): Payer: Self-pay | Admitting: Family Medicine

## 2022-06-26 VITALS — BP 109/69 | HR 69 | Temp 97.8°F | Ht 72.0 in | Wt 225.0 lb

## 2022-06-26 DIAGNOSIS — E669 Obesity, unspecified: Secondary | ICD-10-CM

## 2022-06-26 DIAGNOSIS — Z6830 Body mass index (BMI) 30.0-30.9, adult: Secondary | ICD-10-CM | POA: Diagnosis not present

## 2022-06-26 DIAGNOSIS — E7849 Other hyperlipidemia: Secondary | ICD-10-CM | POA: Diagnosis not present

## 2022-06-26 DIAGNOSIS — I10 Essential (primary) hypertension: Secondary | ICD-10-CM

## 2022-06-26 NOTE — Progress Notes (Signed)
Chief Complaint:   OBESITY Colton Cummings is here to discuss his progress with his obesity treatment plan along with follow-up of his obesity related diagnoses. Colton Cummings is on keeping a food journal and adhering to recommended goals of 1900-2000 calories and 125+ protein and states Colton Cummings is following his eating plan approximately 25% of the time. Colton Cummings states Colton Cummings is normal steps, 6000 for 7 days/week.  Today's visit was #: 8 Starting weight: 255 LBS Starting date: 12/06/2021 Today's weight: 225 LBS Today's date: 06/26/2022 Total lbs lost to date: 30 LBS Total lbs lost since last in-office visit: 3 LBS  Interim History: Colton Cummings returns to clinic for first time since 05/22/22.  Colton Cummings says Colton Cummings has been mostly eating- eating mostly the same food Colton Cummings normally eats just more of it.  Eating salad and ham sandwichs for lunch.  Oatmeal and bacon for breakfast.  Does occasionally do barbecue and may have a hush puppy or two.  Is doing a protein shake as an adjunct to meal plan.  Hasn't really been hungry but when Colton Cummings gets hungry Colton Cummings eats. Next few weeks Colton Cummings and his wife are going down to [place] for a trade show.  Colton Cummings hasn't been drinking enough water.    Subjective:   1. Essential hypertension Colton Cummings's blood pressure was within normal limits but on the lower end of normal.  Colton Cummings is taking chlorthalidone and Cozaar.  2. Other hyperlipidemia Colton Cummings's PCP recently added Zetia.  Last lipid panel was within normal limits.  Assessment/Plan:   1. Essential hypertension Continue both medications the Colton Cummings was encouraged to check blood pressure at home in case this starts to decrease/drop, so Colton Cummings can reach out to his PCP.  2. Other hyperlipidemia Follow-up with PCP.  3. Obesity with current BMI of 30.5 Colton Cummings is currently in the action stage of change. As such, his goal is to continue with weight loss efforts. Colton Cummings has agreed to keeping a food journal and adhering to recommended goals of 1900-2000 calories and  125+ protein daily.   Exercise goals:  Colton Cummings was encouraged to start 10 to 15 minutes 3 times a week of resistance training.  Behavioral modification strategies: increasing lean protein intake, meal planning and cooking strategies, keeping healthy foods in the home, and planning for success.  Colton Cummings has agreed to follow-up with our clinic in 4 weeks. Colton Cummings was informed of the importance of frequent follow-up visits to maximize his success with intensive lifestyle modifications for his multiple health conditions.   Objective:   Blood pressure 109/69, pulse 69, temperature 97.8 F (36.6 C), height 6' (1.829 m), weight 225 lb (102.1 kg), SpO2 96 %. Body mass index is 30.52 kg/m.  General: Cooperative, alert, well developed, in no acute distress. HEENT: Conjunctivae and lids unremarkable. Cardiovascular: Regular rhythm.  Lungs: Normal work of breathing. Neurologic: No focal deficits.   Lab Results  Component Value Date   CREATININE 0.75 (L) 04/20/2022   BUN 22 04/20/2022   NA 141 04/20/2022   K 4.1 04/20/2022   CL 100 04/20/2022   CO2 28 04/20/2022   Lab Results  Component Value Date   ALT 40 12/06/2021   AST 28 12/06/2021   ALKPHOS 57 12/06/2021   BILITOT 0.5 12/06/2021   Lab Results  Component Value Date   HGBA1C 5.6 04/20/2022   HGBA1C 5.3 12/06/2021   Lab Results  Component Value Date   INSULIN 21.2 04/20/2022   INSULIN 17.0 12/06/2021   Lab Results  Component Value Date  TSH 1.340 12/06/2021   Lab Results  Component Value Date   CHOL 84 (L) 04/20/2022   HDL 34 (L) 04/20/2022   LDLCALC 35 04/20/2022   TRIG 66 04/20/2022   Lab Results  Component Value Date   VD25OH 55.1 04/20/2022   VD25OH 63.5 12/06/2021   Lab Results  Component Value Date   WBC 9.3 03/26/2022   HGB 11.9 (L) 03/26/2022   HCT 35.2 (L) 03/26/2022   MCV 95.7 03/26/2022   PLT 153 03/26/2022   No results found for: "IRON", "TIBC", "FERRITIN"  Attestation Statements:   Reviewed by  clinician on day of visit: allergies, medications, problem list, medical history, surgical history, family history, social history, and previous encounter notes.  I, Davy Pique, RMA, am acting as transcriptionist for Coralie Common, MD.  I have reviewed the above documentation for accuracy and completeness, and I agree with the above. - Coralie Common, MD

## 2022-07-24 ENCOUNTER — Ambulatory Visit (INDEPENDENT_AMBULATORY_CARE_PROVIDER_SITE_OTHER): Payer: Medicare Other | Admitting: Family Medicine

## 2022-07-24 ENCOUNTER — Encounter (INDEPENDENT_AMBULATORY_CARE_PROVIDER_SITE_OTHER): Payer: Self-pay | Admitting: Family Medicine

## 2022-07-24 VITALS — BP 99/63 | HR 69 | Temp 98.4°F | Ht 72.0 in | Wt 224.0 lb

## 2022-07-24 DIAGNOSIS — D649 Anemia, unspecified: Secondary | ICD-10-CM

## 2022-07-24 DIAGNOSIS — I10 Essential (primary) hypertension: Secondary | ICD-10-CM | POA: Diagnosis not present

## 2022-07-24 DIAGNOSIS — Z6830 Body mass index (BMI) 30.0-30.9, adult: Secondary | ICD-10-CM | POA: Diagnosis not present

## 2022-07-24 DIAGNOSIS — E669 Obesity, unspecified: Secondary | ICD-10-CM

## 2022-07-24 MED ORDER — LOSARTAN POTASSIUM 50 MG PO TABS: 25.0000 mg | ORAL_TABLET | Freq: Every day | ORAL | Status: AC

## 2022-07-24 NOTE — Progress Notes (Signed)
Chief Complaint:   OBESITY Colton Cummings is here to discuss his progress with his obesity treatment plan along with follow-up of his obesity related diagnoses. [name] is on keeping a food journal and adhering to recommended goals of 1900-2000 calories and 125+ protein and states he is following his eating plan approximately 50% of the time. [name] states he is walking 6,000-12,000 steps 7 times per week.  Today's visit was #: 9  Starting weight: 255 LBS Starting date: 12/06/2021 Today's weight: 224 LBS Today's date: 07/24/2022 Total lbs lost to date: 31 LBS Total lbs lost since last in-office visit: 1 LB  Interim History: Patient has felt more lazy in terms of compliance to meal plan over the last few weeks.  He went to a convention last week in [place] and voices he knows he overate.  He has not upcoming plans for travel or events.  He mentions he thinks he will be able to be more compliant on his food choices now that he will be local. He is supplementing his protein intake with a shake.  Subjective:   1. Essential hypertension Blood pressure within normal limits but lower end of normal.  He is on chlorthalidone and Cozaar daily at 25 mg and 50 mg.  2. Normocytic anemia Patient is taking a multivitamin daily.  Patient last H&H slightly low.  Assessment/Plan:   1. Essential hypertension Decrease Cozaar to 25 mg daily.  Patient to break pill in half.  Decrease- losartan (COZAAR) 50 MG tablet; Take 0.5 tablets (25 mg total) by mouth daily.  2. Normocytic anemia Follow-up CBC with next appointment with PCP.  3. Obesity with current BMI of 30.5 [name] is currently in the action stage of change. As such, his goal is to continue with weight loss efforts. He has agreed to keeping a food journal and adhering to recommended goals of 1900-2000 calories and 125+ protein.   Exercise goals: All adults should avoid inactivity. Some physical activity is better than none, and adults who  participate in any amount of physical activity gain some health benefits.  Behavioral modification strategies: increasing lean protein intake, meal planning and cooking strategies, keeping healthy foods in the home, and planning for success.  [name] has agreed to follow-up with our clinic in 3 weeks. He was informed of the importance of frequent follow-up visits to maximize his success with intensive lifestyle modifications for his multiple health conditions.   Objective:   Blood pressure 99/63, pulse 69, temperature 98.4 F (36.9 C), height 6' (1.829 m), weight 224 lb (101.6 kg), SpO2 96 %. Body mass index is 30.38 kg/m.  General: Cooperative, alert, well developed, in no acute distress. HEENT: Conjunctivae and lids unremarkable. Cardiovascular: Regular rhythm.  Lungs: Normal work of breathing. Neurologic: No focal deficits.   Lab Results  Component Value Date   CREATININE 0.75 (L) 04/20/2022   BUN 22 04/20/2022   NA 141 04/20/2022   K 4.1 04/20/2022   CL 100 04/20/2022   CO2 28 04/20/2022   Lab Results  Component Value Date   ALT 40 12/06/2021   AST 28 12/06/2021   ALKPHOS 57 12/06/2021   BILITOT 0.5 12/06/2021   Lab Results  Component Value Date   HGBA1C 5.6 04/20/2022   HGBA1C 5.3 12/06/2021   Lab Results  Component Value Date   INSULIN 21.2 04/20/2022   INSULIN 17.0 12/06/2021   Lab Results  Component Value Date   TSH 1.340 12/06/2021   Lab Results  Component Value Date  CHOL 84 (L) 04/20/2022   HDL 34 (L) 04/20/2022   LDLCALC 35 04/20/2022   TRIG 66 04/20/2022   Lab Results  Component Value Date   VD25OH 55.1 04/20/2022   VD25OH 63.5 12/06/2021   Lab Results  Component Value Date   WBC 9.3 03/26/2022   HGB 11.9 (L) 03/26/2022   HCT 35.2 (L) 03/26/2022   MCV 95.7 03/26/2022   PLT 153 03/26/2022   No results found for: "IRON", "TIBC", "FERRITIN"  Attestation Statements:   Reviewed by clinician on day of visit: allergies, medications,  problem list, medical history, surgical history, family history, social history, and previous encounter notes.  I, Malcolm Metro, RMA, am acting as transcriptionist for Reuben Likes, MD.  I have reviewed the above documentation for accuracy and completeness, and I agree with the above. - Reuben Likes, MD

## 2022-07-27 DIAGNOSIS — N401 Enlarged prostate with lower urinary tract symptoms: Secondary | ICD-10-CM | POA: Diagnosis not present

## 2022-07-27 DIAGNOSIS — R3915 Urgency of urination: Secondary | ICD-10-CM | POA: Diagnosis not present

## 2022-08-14 ENCOUNTER — Ambulatory Visit (INDEPENDENT_AMBULATORY_CARE_PROVIDER_SITE_OTHER): Payer: Medicare Other | Admitting: Family Medicine

## 2022-08-17 ENCOUNTER — Ambulatory Visit (INDEPENDENT_AMBULATORY_CARE_PROVIDER_SITE_OTHER): Payer: Medicare Other | Admitting: Family Medicine

## 2022-08-17 ENCOUNTER — Encounter (INDEPENDENT_AMBULATORY_CARE_PROVIDER_SITE_OTHER): Payer: Self-pay | Admitting: Family Medicine

## 2022-08-17 VITALS — BP 110/65 | HR 77 | Temp 98.3°F | Ht 72.0 in | Wt 219.0 lb

## 2022-08-17 DIAGNOSIS — I10 Essential (primary) hypertension: Secondary | ICD-10-CM | POA: Diagnosis not present

## 2022-08-17 DIAGNOSIS — E7849 Other hyperlipidemia: Secondary | ICD-10-CM | POA: Diagnosis not present

## 2022-08-17 DIAGNOSIS — E669 Obesity, unspecified: Secondary | ICD-10-CM | POA: Diagnosis not present

## 2022-08-17 DIAGNOSIS — Z6829 Body mass index (BMI) 29.0-29.9, adult: Secondary | ICD-10-CM | POA: Diagnosis not present

## 2022-08-17 NOTE — Progress Notes (Signed)
Chief Complaint:   OBESITY Colton Cummings is here to discuss his progress with his obesity treatment plan along with follow-up of his obesity related diagnoses. Colton Cummings is on keeping a food journal and adhering to recommended goals of 1900-2000 calories and 125+ grams of protein and states he is following his eating plan approximately 80% of the time. Colton Cummings states he is active while remodeling his townhouse.  Today's visit was #: 10 Starting weight: 255 lbs Starting date: 12/06/2021 Today's weight: 219 lbs Today's date: 08/17/2022 Total lbs lost to date: 36 Total lbs lost since last in-office visit: 5  Interim History: Patient has been working more over the last few weeks and has been helping remodel the house he and his wife are hoping to sell.  A painter is painting and someone is laying the floors down but otherwise he is doing a good amount. He is hoping to get the house on the market next weekend.   Subjective:   1. Essential hypertension Jyles checked his blood pressure at home and his home cuff read systolic of 120s.  His check at PCP systolic was 120.  He is on chlorthalidone and Cozaar.  2. Other hyperlipidemia [name] is on Zetia and Zocor.  His last LDL was 35, HDL 34, and triglycerides 66.  Assessment/Plan:   1. Essential hypertension Ankith will continue his current medications with no change in dose.  2. Other hyperlipidemia Jashaun will continue his current medications, and we will repeat labs in 2 to 3 months.  3. BMI 29.0-29.9,adult  4. Obesity with starting BMI of 34.5 Colton Cummings is currently in the action stage of change. As such, his goal is to continue with weight loss efforts. He has agreed to keeping a food journal and adhering to recommended goals of 1900-2000 calories and 125+ grams of protein daily.   Exercise goals: All adults should avoid inactivity. Some physical activity is better than none, and adults who participate in any amount of physical  activity gain some health benefits.  Behavioral modification strategies: increasing lean protein intake, meal planning and cooking strategies, keeping healthy foods in the home, and planning for success.  Randalph has agreed to follow-up with our clinic in 4 weeks. He was informed of the importance of frequent follow-up visits to maximize his success with intensive lifestyle modifications for his multiple health conditions.   Objective:   Blood pressure 110/65, pulse 77, temperature 98.3 F (36.8 C), height 6' (1.829 m), weight 219 lb (99.3 kg), SpO2 95 %. Body mass index is 29.7 kg/m.  General: Cooperative, alert, well developed, in no acute distress. HEENT: Conjunctivae and lids unremarkable. Cardiovascular: Regular rhythm.  Lungs: Normal work of breathing. Neurologic: No focal deficits.   Lab Results  Component Value Date   CREATININE 0.75 (L) 04/20/2022   BUN 22 04/20/2022   NA 141 04/20/2022   K 4.1 04/20/2022   CL 100 04/20/2022   CO2 28 04/20/2022   Lab Results  Component Value Date   ALT 40 12/06/2021   AST 28 12/06/2021   ALKPHOS 57 12/06/2021   BILITOT 0.5 12/06/2021   Lab Results  Component Value Date   HGBA1C 5.6 04/20/2022   HGBA1C 5.3 12/06/2021   Lab Results  Component Value Date   INSULIN 21.2 04/20/2022   INSULIN 17.0 12/06/2021   Lab Results  Component Value Date   TSH 1.340 12/06/2021   Lab Results  Component Value Date   CHOL 84 (L) 04/20/2022   HDL 34 (L)  04/20/2022   LDLCALC 35 04/20/2022   TRIG 66 04/20/2022   Lab Results  Component Value Date   VD25OH 55.1 04/20/2022   VD25OH 63.5 12/06/2021   Lab Results  Component Value Date   WBC 9.3 03/26/2022   HGB 11.9 (L) 03/26/2022   HCT 35.2 (L) 03/26/2022   MCV 95.7 03/26/2022   PLT 153 03/26/2022   No results found for: "IRON", "TIBC", "FERRITIN"  Attestation Statements:   Reviewed by clinician on day of visit: allergies, medications, problem list, medical history, surgical  history, family history, social history, and previous encounter notes.   I, Burt Knack, am acting as transcriptionist for Reuben Likes, MD.  I have reviewed the above documentation for accuracy and completeness, and I agree with the above. - Reuben Likes, MD

## 2022-08-24 DIAGNOSIS — E782 Mixed hyperlipidemia: Secondary | ICD-10-CM | POA: Diagnosis not present

## 2022-09-05 DIAGNOSIS — K219 Gastro-esophageal reflux disease without esophagitis: Secondary | ICD-10-CM | POA: Diagnosis not present

## 2022-09-05 DIAGNOSIS — M069 Rheumatoid arthritis, unspecified: Secondary | ICD-10-CM | POA: Diagnosis not present

## 2022-09-08 ENCOUNTER — Other Ambulatory Visit: Payer: Self-pay | Admitting: Family Medicine

## 2022-09-08 ENCOUNTER — Ambulatory Visit
Admission: RE | Admit: 2022-09-08 | Discharge: 2022-09-08 | Disposition: A | Discharge status: Home / Self Care | Payer: Medicare Other | Source: Ambulatory Visit | Attending: Family Medicine | Admitting: Family Medicine

## 2022-09-08 DIAGNOSIS — M546 Pain in thoracic spine: Secondary | ICD-10-CM

## 2022-09-08 DIAGNOSIS — I2510 Atherosclerotic heart disease of native coronary artery without angina pectoris: Secondary | ICD-10-CM | POA: Diagnosis not present

## 2022-09-08 DIAGNOSIS — I10 Essential (primary) hypertension: Secondary | ICD-10-CM | POA: Diagnosis not present

## 2022-09-08 DIAGNOSIS — E782 Mixed hyperlipidemia: Secondary | ICD-10-CM | POA: Diagnosis not present

## 2022-09-14 DIAGNOSIS — E663 Overweight: Secondary | ICD-10-CM | POA: Diagnosis not present

## 2022-09-19 DIAGNOSIS — M1991 Primary osteoarthritis, unspecified site: Secondary | ICD-10-CM | POA: Diagnosis not present

## 2022-09-19 DIAGNOSIS — Z79899 Other long term (current) drug therapy: Secondary | ICD-10-CM | POA: Diagnosis not present

## 2022-09-19 DIAGNOSIS — M0579 Rheumatoid arthritis with rheumatoid factor of multiple sites without organ or systems involvement: Secondary | ICD-10-CM | POA: Diagnosis not present

## 2022-09-19 DIAGNOSIS — M1009 Idiopathic gout, multiple sites: Secondary | ICD-10-CM | POA: Diagnosis not present

## 2022-09-21 ENCOUNTER — Ambulatory Visit (INDEPENDENT_AMBULATORY_CARE_PROVIDER_SITE_OTHER): Payer: Medicare Other | Admitting: Family Medicine

## 2022-09-21 ENCOUNTER — Encounter (INDEPENDENT_AMBULATORY_CARE_PROVIDER_SITE_OTHER): Payer: Self-pay

## 2022-12-26 DIAGNOSIS — Z125 Encounter for screening for malignant neoplasm of prostate: Secondary | ICD-10-CM | POA: Diagnosis not present

## 2023-01-05 DIAGNOSIS — I10 Essential (primary) hypertension: Secondary | ICD-10-CM | POA: Diagnosis not present

## 2023-01-05 DIAGNOSIS — E782 Mixed hyperlipidemia: Secondary | ICD-10-CM | POA: Diagnosis not present

## 2023-01-05 DIAGNOSIS — I2510 Atherosclerotic heart disease of native coronary artery without angina pectoris: Secondary | ICD-10-CM | POA: Diagnosis not present

## 2023-01-05 DIAGNOSIS — R7302 Impaired glucose tolerance (oral): Secondary | ICD-10-CM | POA: Diagnosis not present

## 2023-01-05 DIAGNOSIS — Z0000 Encounter for general adult medical examination without abnormal findings: Secondary | ICD-10-CM | POA: Diagnosis not present

## 2023-01-12 DIAGNOSIS — M0579 Rheumatoid arthritis with rheumatoid factor of multiple sites without organ or systems involvement: Secondary | ICD-10-CM | POA: Diagnosis not present

## 2023-01-18 ENCOUNTER — Other Ambulatory Visit: Payer: Self-pay | Admitting: Family Medicine

## 2023-01-18 DIAGNOSIS — R1310 Dysphagia, unspecified: Secondary | ICD-10-CM

## 2023-01-26 ENCOUNTER — Other Ambulatory Visit: Payer: Medicare Other

## 2023-01-29 ENCOUNTER — Ambulatory Visit
Admission: RE | Admit: 2023-01-29 | Discharge: 2023-01-29 | Disposition: A | Discharge status: Home / Self Care | Payer: Medicare Other | Source: Ambulatory Visit | Attending: Family Medicine | Admitting: Family Medicine

## 2023-01-29 DIAGNOSIS — R1310 Dysphagia, unspecified: Secondary | ICD-10-CM

## 2023-01-29 DIAGNOSIS — K449 Diaphragmatic hernia without obstruction or gangrene: Secondary | ICD-10-CM | POA: Diagnosis not present

## 2023-01-29 DIAGNOSIS — K219 Gastro-esophageal reflux disease without esophagitis: Secondary | ICD-10-CM | POA: Diagnosis not present

## 2023-03-20 DIAGNOSIS — M1009 Idiopathic gout, multiple sites: Secondary | ICD-10-CM | POA: Diagnosis not present

## 2023-03-20 DIAGNOSIS — M0579 Rheumatoid arthritis with rheumatoid factor of multiple sites without organ or systems involvement: Secondary | ICD-10-CM | POA: Diagnosis not present

## 2023-03-20 DIAGNOSIS — Z79899 Other long term (current) drug therapy: Secondary | ICD-10-CM | POA: Diagnosis not present

## 2023-03-20 DIAGNOSIS — M1991 Primary osteoarthritis, unspecified site: Secondary | ICD-10-CM | POA: Diagnosis not present

## 2023-04-10 ENCOUNTER — Encounter: Payer: Self-pay | Admitting: Gastroenterology

## 2023-06-01 ENCOUNTER — Ambulatory Visit: Payer: Medicare Other

## 2023-06-01 VITALS — Ht 72.0 in | Wt 206.0 lb

## 2023-06-01 DIAGNOSIS — Z860100 Personal history of colon polyps, unspecified: Secondary | ICD-10-CM

## 2023-06-01 MED ORDER — NA SULFATE-K SULFATE-MG SULF 17.5-3.13-1.6 GM/177ML PO SOLN: 1.0000 | Freq: Once | ORAL | 0 refills | Status: AC

## 2023-06-01 NOTE — Progress Notes (Signed)
 No egg or soy allergy known to patient  No issues known to pt with past sedation with any surgeries or procedures Patient denies ever being told they had issues or difficulty with intubation  No FH of Malignant Hyperthermia Pt is on diet pills- Semaglutide, instructions given Pt is not on  home 02  Pt is not on blood thinners  Pt has intermittent issues with constipation- Suprep with extra Miralax prep  No A fib or A flutter Have any cardiac testing pending--No Pt can ambulate  Pt denies use of chewing tobacco Discussed diabetic I weight loss medication holds Discussed NSAID holds Checked BMI Pt instructed to use [employer] or GoodRx for a price reduction on prep

## 2023-06-08 ENCOUNTER — Encounter: Payer: Self-pay | Admitting: Gastroenterology

## 2023-06-11 ENCOUNTER — Encounter: Payer: Self-pay | Admitting: Gastroenterology

## 2023-06-11 ENCOUNTER — Ambulatory Visit (AMBULATORY_SURGERY_CENTER): Payer: Medicare Other | Admitting: Gastroenterology

## 2023-06-11 VITALS — BP 119/78 | HR 62 | Temp 97.2°F | Resp 12 | Ht 72.0 in | Wt 206.0 lb

## 2023-06-11 DIAGNOSIS — K641 Second degree hemorrhoids: Secondary | ICD-10-CM | POA: Diagnosis not present

## 2023-06-11 DIAGNOSIS — Z860100 Personal history of colon polyps, unspecified: Secondary | ICD-10-CM

## 2023-06-11 DIAGNOSIS — K644 Residual hemorrhoidal skin tags: Secondary | ICD-10-CM

## 2023-06-11 DIAGNOSIS — Z1211 Encounter for screening for malignant neoplasm of colon: Secondary | ICD-10-CM | POA: Diagnosis present

## 2023-06-11 DIAGNOSIS — D123 Benign neoplasm of transverse colon: Secondary | ICD-10-CM | POA: Diagnosis not present

## 2023-06-11 DIAGNOSIS — D122 Benign neoplasm of ascending colon: Secondary | ICD-10-CM | POA: Diagnosis not present

## 2023-06-11 DIAGNOSIS — K635 Polyp of colon: Secondary | ICD-10-CM | POA: Diagnosis not present

## 2023-06-11 DIAGNOSIS — D127 Benign neoplasm of rectosigmoid junction: Secondary | ICD-10-CM | POA: Diagnosis not present

## 2023-06-11 MED ORDER — HYDROCORTISONE ACETATE 25 MG RE SUPP: 25.0000 mg | Freq: Every evening | RECTAL | 1 refills | Status: AC

## 2023-06-11 MED ORDER — SODIUM CHLORIDE 0.9 % IV SOLN: 500.0000 mL | Freq: Once | INTRAVENOUS | Status: DC

## 2023-06-11 NOTE — Progress Notes (Signed)
GASTROENTEROLOGY PROCEDURE H&P NOTE   Primary Care Physician: Mosetta Putt, MD  HPI: Colton Cummings is a 78 y.o. male who presents for Colonoscopy for surveillance of previous adenomas and advanced adenomas.  Past Medical History:  Diagnosis Date   Allergic rhinitis    since 2010   Benign localized prostatic hyperplasia with lower urinary tract symptoms (LUTS)    urologist--- dr winter   Cataract    COPD (chronic obstructive pulmonary disease) (HCC) 2013   Coronary artery calcification seen on CT scan 2021   Depression 2006   ED (erectile dysfunction) 2005   GERD (gastroesophageal reflux disease)    H/O adenomatous polyp of colon    History of lower GI bleeding 06/13/2016   acute post colonoscopy polypectomy  in 2014  and 06-13-2016  this one had clippping of bleed   History of posterior vitreous detachment 2009   right eye  per pt resolved   HTN (hypertension) 07/29/2017   Hyperlipidemia, mixed    Idiopathic gout    multiple sites per pt last issues approx 2013   Migraine    Osteoarthritis    multiple sites   Peyronie's disease 2005   Rheumatoid arthritis (HCC) 2020   rheumatologist--- dr aNickola Major   Wears glasses    Wears hearing aid in both ears    Past Surgical History:  Procedure Laterality Date   CATARACT EXTRACTION W/ INTRAOCULAR LENS  IMPLANT, BILATERAL Bilateral 2018   COLONOSCOPY N/A 06/12/2016   Procedure: COLONOSCOPY;  Surgeon: Ruffin Frederick, MD;  Location: [facility];  Service: Gastroenterology;  Laterality: N/A;   COLONOSCOPY WITH PROPOFOL N/A 04/29/2018   Procedure: COLONOSCOPY WITH PROPOFOL;  Surgeon: Meridee Score Netty Starring., MD;  Location[facility] END[facility];  Service: Gastroenterology;  Laterality: N/A;  need tp be 60 minutes   LAPAROSCOPIC APPENDECTOMY N/A 06/16/2015   Procedure: APPENDECTOMY LAPAROSCOPIC;  Surgeon: Glenna Fellows, MD;  Location: [facility];  Service: General;  Laterality: N/A;   POLYPECTOMY  04/29/2018   Procedure:  POLYPECTOMY;  Surgeon: Lemar Lofty., MD;  Location: [facility];  Service: Gastroenterology;;   RECTAL EXAM UNDER ANESTHESIA  01/11/2012   by dr gross @ [facility];   excision anal polyp (benign) and per pt internal hemorrhoid ligation   TONSILLECTOMY     child   TRANSURETHRAL RESECTION OF PROSTATE N/A 03/24/2022   Procedure: TRANSURETHRAL RESECTION OF THE PROSTATE (TURP);  Surgeon: Rene Paci, MD;  Location: [facility];  Service: Urology;  Laterality: N/A;   ULNAR NERVE TRANSPOSITION Left 03/31/2014   Procedure: DECOMPRESSION  LEFT ULNAR NERVE;  Surgeon: Cindee Salt, MD;  Location: [facility];  Service: Orthopedics;  Laterality: Left;   Current Outpatient Medications  Medication Sig Dispense Refill   acetaminophen (TYLENOL) 500 MG tablet Take 500 mg by mouth every 6 (six) hours as needed.     albuterol (VENTOLIN HFA) 108 (90 Base) MCG/ACT inhaler Inhale 2 puffs into the lungs every 6 (six) hours as needed for wheezing or shortness of breath.     chlorthalidone (HYGROTON) 25 MG tablet Take 25 mg by mouth daily.     cholecalciferol (VITAMIN D) 1000 units tablet Take 1,000 Units by mouth at bedtime.      etanercept (ENBREL) 50 MG/ML injection Inject 50 mg into the skin once a week. Friday's     ezetimibe (ZETIA) 10 MG tablet Take 10 mg by mouth daily.     finasteride (PROSCAR) 5 MG tablet Take 5 mg by mouth at  bedtime.     FLUTICASONE PROPIONATE NA Place 2 puffs into the nose daily.     folic acid (FOLVITE) 1 MG tablet Take 1 mg by mouth at bedtime.     ibuprofen (ADVIL,MOTRIN) 200 MG tablet Take 400 mg by mouth 2 (two) times daily.      losartan (COZAAR) 50 MG tablet Take 0.5 tablets (25 mg total) by mouth daily.     methotrexate 2.5 MG tablet Take 12.5 mg by mouth 2 (two) times a week. Per pt takes 5 tabs on Monday's and 5 tabs on Tuesday's     Multiple Vitamin (MULTIVITAMIN WITH MINERALS) TABS tablet Take 1 tablet by mouth daily. Men's 50+      omeprazole (PRILOSEC) 20 MG capsule Take 20 mg by mouth daily.     potassium chloride SA (KLOR-CON M) 20 MEQ tablet Take 1 tablet (20 mEq total) by mouth daily. 3 tablet 0   rosuvastatin (CRESTOR) 40 MG tablet Take 40 mg by mouth daily.     Current Facility-Administered Medications  Medication Dose Route Frequency Provider Last Rate Last Admin   0.9 %  sodium chloride infusion  500 mL Intravenous Once Mansouraty, Netty Starring., MD        Current Outpatient Medications:    acetaminophen (TYLENOL) 500 MG tablet, Take 500 mg by mouth every 6 (six) hours as needed., Disp: , Rfl:    albuterol (VENTOLIN HFA) 108 (90 Base) MCG/ACT inhaler, Inhale 2 puffs into the lungs every 6 (six) hours as needed for wheezing or shortness of breath., Disp: , Rfl:    chlorthalidone (HYGROTON) 25 MG tablet, Take 25 mg by mouth daily., Disp: , Rfl:    cholecalciferol (VITAMIN D) 1000 units tablet, Take 1,000 Units by mouth at bedtime. , Disp: , Rfl:    etanercept (ENBREL) 50 MG/ML injection, Inject 50 mg into the skin once a week. Friday's, Disp: , Rfl:    ezetimibe (ZETIA) 10 MG tablet, Take 10 mg by mouth daily., Disp: , Rfl:    finasteride (PROSCAR) 5 MG tablet, Take 5 mg by mouth at bedtime., Disp: , Rfl:    FLUTICASONE PROPIONATE NA, Place 2 puffs into the nose daily., Disp: , Rfl:    folic acid (FOLVITE) 1 MG tablet, Take 1 mg by mouth at bedtime., Disp: , Rfl:    ibuprofen (ADVIL,MOTRIN) 200 MG tablet, Take 400 mg by mouth 2 (two) times daily. , Disp: , Rfl:    losartan (COZAAR) 50 MG tablet, Take 0.5 tablets (25 mg total) by mouth daily., Disp: , Rfl:    methotrexate 2.5 MG tablet, Take 12.5 mg by mouth 2 (two) times a week. Per pt takes 5 tabs on Monday's and 5 tabs on Tuesday's, Disp: , Rfl:    Multiple Vitamin (MULTIVITAMIN WITH MINERALS) TABS tablet, Take 1 tablet by mouth daily. Men's 50+, Disp: , Rfl:    omeprazole (PRILOSEC) 20 MG capsule, Take 20 mg by mouth daily., Disp: , Rfl:    potassium chloride  SA (KLOR-CON M) 20 MEQ tablet, Take 1 tablet (20 mEq total) by mouth daily., Disp: 3 tablet, Rfl: 0   rosuvastatin (CRESTOR) 40 MG tablet, Take 40 mg by mouth daily., Disp: , Rfl:   Current Facility-Administered Medications:    0.9 %  sodium chloride infusion, 500 mL, Intravenous, Once, Mansouraty, Netty Starring., MD No Known Allergies Family History  Problem Relation Age of Onset   Hypertension Mother    Heart attack Mother 66  died   Other Mother        hx of rheumatic heart disease   Stroke Father    Hypertension Father    Liver disease Sister        liver failure   Hypertension Sister    Diabetes Sister    Hypertension Brother    Bipolar disorder Daughter    Colon cancer Neg Hx    Esophageal cancer Neg Hx    Rectal cancer Neg Hx    Stomach cancer Neg Hx    Inflammatory bowel disease Neg Hx    Pancreatic cancer Neg Hx    Social History   Socioeconomic History   Marital status: Married    Spouse name: Not on file   Number of children: 2   Years of education: Not on file   Highest education level: Not on file  Occupational History    Employer: [employer]  Tobacco Use   Smoking status: Former    Current packs/day: 0.00    Types: Cigarettes    Start date: 01/01/1965    Quit date: 01/02/2000    Years since quitting: 23.4   Smokeless tobacco: Never  Vaping Use   Vaping status: Never Used  Substance and Sexual Activity   Alcohol use: Not Currently   Drug use: Never   Sexual activity: Not on file  Other Topics Concern   Not on file  Social History Narrative   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      He is married.  Lives with his wife.  Enjoys [employer] basketball      Former smoker 1-2 drinks of alcohol every 2 months quit smoking 2001 approximately 60-pack-year history   Social Drivers of Corporate investment banker Strain: Not  on file  Food Insecurity: Not on file  Transportation Needs: Not on file  Physical Activity: Not on file  Stress: Not on file  Social Connections: Not on file  Intimate Partner Violence: Not on file    Physical Exam: Today's Vitals   06/11/23 0713  BP: 123/68  Pulse: 64  Temp: (!) 97.2 F (36.2 C)  TempSrc: Temporal  SpO2: 97%  Weight: 206 lb (93.4 kg)  Height: 6' (1.829 m)   Body mass index is 27.94 kg/m. GEN: NAD EYE: Sclerae anicteric ENT: MMM CV: Non-tachycardic GI: Soft, NT/ND NEURO:  Alert & Oriented x 3  Lab Results: No results for input(s): "WBC", "HGB", "HCT", "PLT" in the last 72 hours. BMET No results for input(s): "NA", "K", "CL", "CO2", "GLUCOSE", "BUN", "CREATININE", "CALCIUM" in the last 72 hours. LFT No results for input(s): "PROT", "ALBUMIN", "AST", "ALT", "ALKPHOS", "BILITOT", "BILIDIR", "IBILI" in the last 72 hours. PT/INR No results for input(s): "LABPROT", "INR" in the last 72 hours.   Impression / Plan: This is a 78 y.o.male who presents for Colonoscopy for surveillance of previous adenomas and advanced adenomas.  The risks and benefits of endoscopic evaluation/treatment were discussed with the patient and/or family; these include but are not limited to the risk of perforation, infection, bleeding, missed lesions, lack of diagnosis, severe illness requiring hospitalization, as well as anesthesia and sedation related illnesses.  The patient's history has been reviewed, patient examined, no change in status, and deemed stable for procedure.  The patient and/or family is agreeable to proceed.    Corliss Parish, MD [facility]oscopy Office #  [phone] 

## 2023-06-11 NOTE — Progress Notes (Signed)
 Sedate, gd SR, tolerated procedure well, VSS, report to RN

## 2023-06-11 NOTE — Op Note (Signed)
 [facility] Patient Name: Colton Cummings Procedure Date: 06/11/2023 7:45 AM MRN: [id number] Endoscopist: Corliss Parish , MD, [id number] Age: 78 Referring MD:  Date of Birth: [birth year] Gender: Male Account #: [id number] Procedure:                Colonoscopy Indications:              Surveillance: Personal history of adenomatous                            polyps on last colonoscopy 3 years ago, High risk                            colon cancer surveillance: Personal history of                            adenoma (10 mm or greater in size) Medicines:                Monitored Anesthesia Care Procedure:                Pre-Anesthesia Assessment:                           - Prior to the procedure, a History and Physical                            was performed, and patient medications and                            allergies were reviewed. The patient's tolerance of                            previous anesthesia was also reviewed. The risks                            and benefits of the procedure and the sedation                            options and risks were discussed with the patient.                            All questions were answered, and informed consent                            was obtained. Prior Anticoagulants: The patient has                            taken no anticoagulant or antiplatelet agents                            except for NSAID medication. ASA Grade Assessment:                            II - A patient with mild systemic disease. After  reviewing the risks and benefits, the patient was                            deemed in satisfactory condition to undergo the                            procedure.                           After obtaining informed consent, the colonoscope                            was passed under direct vision. Throughout the                            procedure, the patient's blood pressure, pulse, and                             oxygen saturations were monitored continuously. The                            Olympus Scope SN: [id number] was introduced through                            the anus and advanced to the the cecum, identified                            by appendiceal orifice and ileocecal valve. The                            colonoscopy was performed without difficulty. The                            patient tolerated the procedure. The quality of the                            bowel preparation was adequate. The ileocecal                            valve, appendiceal orifice, and rectum were                            photographed. Scope In: 7:49:37 AM Scope Out: 8:12:23 AM Scope Withdrawal Time: 0 hours 17 minutes 14 seconds  Total Procedure Duration: 0 hours 22 minutes 46 seconds  Findings:                 The digital rectal exam findings include                            hemorrhoids. Pertinent negatives include no                            palpable rectal lesions.  A large amount of semi-liquid stool was found in                            the entire colon, interfering with visualization.                            Lavage of the area was performed using copious                            amounts, resulting in clearance with adequate                            visualization.                           Five sessile polyps were found in the recto-sigmoid                            colon (1), transverse colon (1), hepatic flexure                            (1) and ascending colon (2). The polyps were 2 to 8                            mm in size. These polyps were removed with a cold                            snare. Resection and retrieval were complete.                           Non-bleeding non-thrombosed external and internal                            hemorrhoids were found during retroflexion, during                            perianal exam and during  digital exam. The                            hemorrhoids were Grade II (internal hemorrhoids                            that prolapse but reduce spontaneously). Complications:            No immediate complications. Estimated Blood Loss:     Estimated blood loss was minimal. Impression:               - Hemorrhoids found on digital rectal exam.                           - Stool in the entire examined colon. Lavaged                            copiously with adequate visualization.                           -  Five 2 to 8 mm polyps at the recto-sigmoid colon,                            in the transverse colon, at the hepatic flexure and                            in the ascending colon, removed with a cold snare.                            Resected and retrieved.                           - Non-bleeding non-thrombosed external and internal                            hemorrhoids. Recommendation:           - The patient will be observed post-procedure,                            until all discharge criteria are met.                           - Discharge patient to home.                           - Patient has a contact number available for                            emergencies. The signs and symptoms of potential                            delayed complications were discussed with the                            patient. Return to normal activities tomorrow.                            Written discharge instructions were provided to the                            patient.                           - High fiber diet.                           - Use FiberCon 1-2 tablets PO daily.                           - Continue present medications.                           - Await pathology results.                           -  Repeat colonoscopy in 3 years for surveillance.                            This will be based on the patient's age and medical                            comorbidities at the time as  he will be 78 years of                            age.                           - The findings and recommendations were discussed                            with the patient.                           - The findings and recommendations were discussed                            with the patient's family. Corliss Parish, MD 06/11/2023 8:21:57 AM

## 2023-06-11 NOTE — Patient Instructions (Addendum)
 Resume previous diet--High fiber diet is recommended Continue present medications Take miralax daily or every other day (put a capful in your coffee or juice every morning and stir) Use preparation H and Anusol suppositories for hemorrhoids Colonoscopy in 3 years with office visit first Await pathology results  Handouts/information given for High fiber diet, polyps and hemorrhoids  YOU HAD AN ENDOSCOPIC PROCEDURE TODAY AT THE [facility]:   Refer to the procedure report that was given to you for any specific questions about what was found during the examination.  If the procedure report does not answer your questions, please call your gastroenterologist to clarify.  If you requested that your care partner not be given the details of your procedure findings, then the procedure report has been included in a sealed envelope for you to review at your convenience later.  YOU SHOULD EXPECT: Some feelings of bloating in the abdomen. Passage of more gas than usual.  Walking can help get rid of the air that was put into your GI tract during the procedure and reduce the bloating. If you had a lower endoscopy (such as a colonoscopy or flexible sigmoidoscopy) you may notice spotting of blood in your stool or on the toilet paper. If you underwent a bowel prep for your procedure, you may not have a normal bowel movement for a few days.  Please Note:  You might notice some irritation and congestion in your nose or some drainage.  This is from the oxygen used during your procedure.  There is no need for concern and it should clear up in a day or so.  SYMPTOMS TO REPORT IMMEDIATELY:  Following lower endoscopy (colonoscopy):  Excessive amounts of blood in the stool  Significant tenderness or worsening of abdominal pains  Swelling of the abdomen that is new, acute  Fever of 100F or higher  For urgent or emergent issues, a gastroenterologist can be reached at any hour by calling [phone]. Do  not use MyChart messaging for urgent concerns.   DIET:  We do recommend a small meal at first, but then you may proceed to your regular diet.  Drink plenty of fluids but you should avoid alcoholic beverages for 24 hours.  ACTIVITY:  You should plan to take it easy for the rest of today and you should NOT DRIVE or use heavy machinery until tomorrow (because of the sedation medicines used during the test).    FOLLOW UP: Our staff will call the number listed on your records the next business day following your procedure.  We will call around 7:15- 8:00 am to check on you and address any questions or concerns that you may have regarding the information given to you following your procedure. If we do not reach you, we will leave a message.     If any biopsies were taken you will be contacted by phone or by letter within the next 1-3 weeks.  Please call us at [phone] if you have not heard about the biopsies in 3 weeks.   SIGNATURES/CONFIDENTIALITY: You and/or your care partner have signed paperwork which will be entered into your electronic medical record.  These signatures attest to the fact that that the information above on your After Visit Summary has been reviewed and is understood.  Full responsibility of the confidentiality of this discharge information lies with you and/or your care-partner.

## 2023-06-11 NOTE — Progress Notes (Signed)
 Pt's states no medical or surgical changes since previsit or office visit.

## 2023-06-11 NOTE — Progress Notes (Signed)
 Called to room to assist during endoscopic procedure.  Patient ID and intended procedure confirmed with present staff. Received instructions for my participation in the procedure from the performing physician.

## 2023-06-12 ENCOUNTER — Telehealth: Payer: Self-pay

## 2023-06-12 NOTE — Telephone Encounter (Signed)
  Follow up Call-     06/11/2023    7:11 AM 05/12/2022   12:43 PM  Call back number  Post procedure Call Back phone  # [phone]  Permission to leave phone message Yes Yes     Patient questions:  Do you have a fever, pain , or abdominal swelling? No. Pain Score  0 *  Have you tolerated food without any problems? Yes.    Have you been able to return to your normal activities? Yes.    Do you have any questions about your discharge instructions: Diet   No. Medications  No. Follow up visit  No.  Do you have questions or concerns about your Care? No.  Actions: * If pain score is 4 or above: No action needed, pain <4.

## 2023-06-13 LAB — SURGICAL PATHOLOGY

## 2023-06-18 ENCOUNTER — Encounter: Payer: Self-pay | Admitting: Gastroenterology

## 2023-09-08 ENCOUNTER — Emergency Department

## 2023-09-08 ENCOUNTER — Other Ambulatory Visit: Payer: Self-pay

## 2023-09-08 ENCOUNTER — Emergency Department
Admission: EM | Admit: 2023-09-08 | Discharge: 2023-09-08 | Disposition: A | Discharge status: Home / Self Care | Attending: Emergency Medicine | Admitting: Emergency Medicine

## 2023-09-08 DIAGNOSIS — S92354A Nondisplaced fracture of fifth metatarsal bone, right foot, initial encounter for closed fracture: Secondary | ICD-10-CM | POA: Insufficient documentation

## 2023-09-08 DIAGNOSIS — W01198A Fall on same level from slipping, tripping and stumbling with subsequent striking against other object, initial encounter: Secondary | ICD-10-CM | POA: Diagnosis not present

## 2023-09-08 DIAGNOSIS — S0990XA Unspecified injury of head, initial encounter: Secondary | ICD-10-CM | POA: Diagnosis present

## 2023-09-08 DIAGNOSIS — S066X0A Traumatic subarachnoid hemorrhage without loss of consciousness, initial encounter: Secondary | ICD-10-CM | POA: Insufficient documentation

## 2023-09-08 DIAGNOSIS — I609 Nontraumatic subarachnoid hemorrhage, unspecified: Secondary | ICD-10-CM

## 2023-09-08 DIAGNOSIS — J449 Chronic obstructive pulmonary disease, unspecified: Secondary | ICD-10-CM | POA: Diagnosis not present

## 2023-09-08 DIAGNOSIS — I10 Essential (primary) hypertension: Secondary | ICD-10-CM | POA: Diagnosis not present

## 2023-09-08 LAB — CBC WITH DIFFERENTIAL/PLATELET
Abs Immature Granulocytes: 0.03 10*3/uL (ref 0.00–0.07)
Basophils Absolute: 0 10*3/uL (ref 0.0–0.1)
Basophils Relative: 0 %
Eosinophils Absolute: 0.4 10*3/uL (ref 0.0–0.5)
Eosinophils Relative: 5 %
HCT: 37.1 % — ABNORMAL LOW (ref 39.0–52.0)
Hemoglobin: 12.8 g/dL — ABNORMAL LOW (ref 13.0–17.0)
Immature Granulocytes: 0 %
Lymphocytes Relative: 9 %
Lymphs Abs: 0.8 10*3/uL (ref 0.7–4.0)
MCH: 32.7 pg (ref 26.0–34.0)
MCHC: 34.5 g/dL (ref 30.0–36.0)
MCV: 94.6 fL (ref 80.0–100.0)
Monocytes Absolute: 0.4 10*3/uL (ref 0.1–1.0)
Monocytes Relative: 4 %
Neutro Abs: 7.3 10*3/uL (ref 1.7–7.7)
Neutrophils Relative %: 82 %
Platelets: 188 10*3/uL (ref 150–400)
RBC: 3.92 MIL/uL — ABNORMAL LOW (ref 4.22–5.81)
RDW: 13.1 % (ref 11.5–15.5)
WBC: 8.9 10*3/uL (ref 4.0–10.5)
nRBC: 0 % (ref 0.0–0.2)

## 2023-09-08 LAB — COMPREHENSIVE METABOLIC PANEL WITH GFR
ALT: 79 U/L — ABNORMAL HIGH (ref 0–44)
AST: 59 U/L — ABNORMAL HIGH (ref 15–41)
Albumin: 3.9 g/dL (ref 3.5–5.0)
Alkaline Phosphatase: 44 U/L (ref 38–126)
Anion gap: 8 (ref 5–15)
BUN: 27 mg/dL — ABNORMAL HIGH (ref 8–23)
CO2: 29 mmol/L (ref 22–32)
Calcium: 9.5 mg/dL (ref 8.9–10.3)
Chloride: 100 mmol/L (ref 98–111)
Creatinine, Ser: 0.89 mg/dL (ref 0.61–1.24)
GFR, Estimated: >60 mL/min (ref >60)
Glucose, Bld: 103 mg/dL — ABNORMAL HIGH (ref 70–99)
Potassium: 3.3 mmol/L — ABNORMAL LOW (ref 3.5–5.1)
Sodium: 137 mmol/L (ref 135–145)
Total Bilirubin: 0.6 mg/dL (ref 0.0–1.2)
Total Protein: 6.8 g/dL (ref 6.5–8.1)

## 2023-09-08 LAB — PROTIME-INR
INR: 1.2 (ref 0.8–1.2)
Prothrombin Time: 15.4 s — ABNORMAL HIGH (ref 11.4–15.2)

## 2023-09-08 NOTE — ED Notes (Signed)
 First Nurse Note, Pt sent over from [facility] d/t multiple falls at home, one on 5/22 with LOC and the most recent on 5/23 with out LOC/ Pt hit his face on the ground with bruising and h/a. Pt is on aspirin .

## 2023-09-08 NOTE — ED Provider Notes (Signed)
 [facility] Provider Note    Event Date/Time   First MD Initiated Contact with Patient 09/08/23 [phone]     (approximate)   History   Fall   HPI  Colton Cummings is a 78 y.o. male with a history of COPD, GERD, hypertension, hyperlipidemia, gout, osteoarthritis, who presents with headache and left foot pain after 2 falls which he suffered last week.  The patient states on both occasions he tripped and fell, and did not have any lightheadedness or near syncope prior to falling.  The first time he hit his head and briefly passed out.  The second time he hit his face but did not pass out.  He injured his right shoulder and believes that it may have briefly dislocated before reducing spontaneously.  His shoulder pain has since resolved.  He has persistent pain in the right foot.  He has also had a persistent frontal headache since the falls.  He denies any associated nausea or vomiting, vision changes, or dizziness.  He has no neck or back pain.  I reviewed the past medical records.  The patient's most recent outpatient encounter in our system was a visit with Dr. Jonne Netters Roddy from GI on 3/3 for colonoscopy.  He has no recent hospitalizations.   Physical Exam   Triage Vital Signs: ED Triage Vitals  Encounter Vitals Group     BP --      Systolic BP Percentile --      Diastolic BP Percentile --      Pulse --      Resp --      Temp 09/08/23 1004 97.7 F (36.5 C)     Temp Source 09/08/23 1004 Oral     SpO2 --      Weight 09/08/23 0942 205 lb 14.6 oz (93.4 kg)     Height 09/08/23 0942 6' (1.829 m)     Head Circumference --      Peak Flow --      Pain Score 09/08/23 0941 4     Pain Loc --      Pain Education --      Exclude from Growth Chart --     Most recent vital signs: Vitals:   09/08/23 1130 09/08/23 1230  BP: 110/69 107/75  Pulse: 65 61  Resp: 18 18  Temp:    SpO2: 97% 96%     General: Alert, comfortable appearing, no distress.  CV:  Good  peripheral perfusion.  Resp:  Normal effort.  Abd:  No distention.  Other:  Mild tenderness to the dorsum of the right foot.  Full range of motion of right shoulder.  No midline cervical spinal tenderness.  EOMI.  PERRLA.  No photophobia.  No facial droop.  Normal speech.  Motor intact in all extremities.  No ataxia on finger-to-nose.  Subacute appearing bruising to the forehead.  No facial bony tenderness.   ED Results / Procedures / Treatments   Labs (all labs ordered are listed, but only abnormal results are displayed) Labs Reviewed  CBC WITH DIFFERENTIAL/PLATELET - Abnormal; Notable for the following components:      Result Value   RBC 3.92 (*)    Hemoglobin 12.8 (*)    HCT 37.1 (*)    All other components within normal limits  COMPREHENSIVE METABOLIC PANEL WITH GFR - Abnormal; Notable for the following components:   Potassium 3.3 (*)    Glucose, Bld 103 (*)    BUN 27 (*)  AST 59 (*)    ALT 79 (*)    All other components within normal limits  PROTIME-INR - Abnormal; Notable for the following components:   Prothrombin Time 15.4 (*)    All other components within normal limits     EKG  ED ECG REPORT I, Lind Repine, the attending physician, personally viewed and interpreted this ECG.  Date: 09/08/2023 EKG Time: 0940 Rate: 68 Rhythm: normal sinus rhythm QRS Axis: normal Intervals: normal ST/T Wave abnormalities: Nonspecific ST abnormality Narrative Interpretation: no evidence of acute ischemia    RADIOLOGY  CT head:   IMPRESSION:  1. Trace subarachnoid hemorrhage along two sulci in the right parietal lobe.  mBIG 1.    The professional radiologist assistant staff have been asked to put me in  contact with the referring provider for communication of results. An addendum  will be submitted to document this communication.    XR R foot: I independently viewed and interpreted the images; there is 1/5 metatarsal fracture   PROCEDURES:  Critical Care  performed: No  Procedures   MEDICATIONS ORDERED IN ED: Medications - No data to display   IMPRESSION / MDM / ASSESSMENT AND PLAN / ED COURSE  I reviewed the triage vital signs and the nursing notes.  78 year old male with PMH as noted above presents with persistent headache as well as right foot pain after 2 falls he sustained last week.  He denies other injuries currently.  He initially had some right shoulder pain, but this has resolved.  Differential diagnosis includes, but is not limited to:  Head injury: Minor head injury, concussion, ICH.  Will obtain a CT.  The patient is not on anticoagulation.  Foot injury: Contusion, fracture we will obtain x-rays.  Patient's presentation is most consistent with acute presentation with potential threat to life or bodily function.  ----------------------------------------- 1:04 PM on 09/08/2023 -----------------------------------------  CT head shows a trace subarachnoid.  I consulted and discussed the case with Dr. Jeris Montes from neurosurgery who reviewed the images.  He recommends no intervention at this time and the patient does not require specialist follow-up unless he has persistent symptoms.  He recommends normal concussion precautions.  The patient is not on anticoagulation or antiplatelet agents.  X-rays of the foot show 1/5 metatarsal fracture.  I consulted and discussed the case with Dr. Althea Atkinson from podiatry who advises that this is not a Jones type fracture and he recommends that the patient can be weightbearing as tolerated in a cam boot.  I counseled the patient on the results of the workup and plan of care.  I gave him strict return precautions, and he expressed understanding.  He is stable for discharge at this time.   FINAL CLINICAL IMPRESSION(S) / ED DIAGNOSES   Final diagnoses:  Subarachnoid bleed (HCC)  Closed nondisplaced fracture of fifth metatarsal bone of right foot, initial encounter     Rx / DC Orders    ED Discharge Orders     None        Note:  This document was prepared using Dragon voice recognition software and may include unintentional dictation errors.    Lind Repine, MD 09/08/23 [phone]

## 2023-09-08 NOTE — ED Notes (Signed)
 Patient up to the toilet.

## 2023-09-08 NOTE — ED Triage Notes (Addendum)
 Pt comes in from [facility] after tripping and falling last week 5/22. Pt states that he hit his head and shoulder when he fell that day and lost consciousness. Pt states that EMS came out to check on him, but her refused to be transported to the hospital. Pt then tripped and fell on 5/23 with no LOC, but pt did hurt his foot and ankle with that fall. Today pt complains of frontal head pain, foot pain and right shoulder pain. Pt with bruising to the front of his head, and bridge of nose. Pt has been having the frontal headaches since he fell on 5/22. Pt is not on blood thinners.  Pt is alert and oriented x4 with no signs of acute distress at this time.

## 2023-09-08 NOTE — Discharge Instructions (Signed)
 You have a very small bleed in your brain.  This should resolve on its own.  Follow-up with your primary care provider.  Return to the ER immediately for new, worsening, or persistent severe headache, vomiting, dizziness, seizure, or any other new or worsening symptoms that concern you.  You also have a fracture of the fifth metatarsal bone in your right foot.  You may bear weight as tolerated but should wear the cam boot provided.  Keep the foot elevated and iced when you are not walking.  Follow-up with podiatry in 1 week.
